# Patient Record
Sex: Male | Born: 1955
Health system: Southern US, Community
[De-identification: ages and names within clinical notes are randomized; demographics above are authoritative.]

## PROBLEM LIST (undated history)

## (undated) DIAGNOSIS — K649 Unspecified hemorrhoids: Secondary | ICD-10-CM

## (undated) DIAGNOSIS — F419 Anxiety disorder, unspecified: Secondary | ICD-10-CM

## (undated) DIAGNOSIS — G20 Parkinson's disease: Secondary | ICD-10-CM

## (undated) DIAGNOSIS — E785 Hyperlipidemia, unspecified: Secondary | ICD-10-CM

## (undated) DIAGNOSIS — T7840XA Allergy, unspecified, initial encounter: Secondary | ICD-10-CM

## (undated) DIAGNOSIS — K589 Irritable bowel syndrome without diarrhea: Secondary | ICD-10-CM

## (undated) DIAGNOSIS — G20A1 Parkinson's disease without dyskinesia, without mention of fluctuations: Secondary | ICD-10-CM

## (undated) DIAGNOSIS — R569 Unspecified convulsions: Secondary | ICD-10-CM

## (undated) DIAGNOSIS — F329 Major depressive disorder, single episode, unspecified: Secondary | ICD-10-CM

## (undated) HISTORY — DX: Anxiety disorder, unspecified: F41.9

## (undated) HISTORY — DX: Parkinson's disease: G20

## (undated) HISTORY — DX: Major depressive disorder, single episode, unspecified: F32.9

## (undated) HISTORY — PX: ROOT CANAL: SHX2363

## (undated) HISTORY — DX: Irritable bowel syndrome without diarrhea: K58.9

## (undated) HISTORY — DX: Unspecified convulsions: R56.9

## (undated) HISTORY — DX: Unspecified hemorrhoids: K64.9

## (undated) HISTORY — DX: Hyperlipidemia, unspecified: E78.5

## (undated) HISTORY — PX: APPENDECTOMY: SHX54

## (undated) HISTORY — DX: Allergy, unspecified, initial encounter: T78.40XA

## (undated) HISTORY — DX: Parkinson's disease without dyskinesia, without mention of fluctuations: G20.A1

---
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
## 2000-05-01 ENCOUNTER — Encounter: Payer: Self-pay | Admitting: Gastroenterology

## 2000-05-01 HISTORY — PX: COLONOSCOPY: SHX174

## 2004-05-17 ENCOUNTER — Ambulatory Visit: Payer: Self-pay | Admitting: Internal Medicine

## 2004-08-12 ENCOUNTER — Ambulatory Visit: Payer: Self-pay | Admitting: Internal Medicine

## 2005-03-21 ENCOUNTER — Ambulatory Visit: Payer: Self-pay | Admitting: Internal Medicine

## 2006-08-11 DIAGNOSIS — F329 Major depressive disorder, single episode, unspecified: Secondary | ICD-10-CM

## 2006-08-11 DIAGNOSIS — F32A Depression, unspecified: Secondary | ICD-10-CM

## 2006-08-11 DIAGNOSIS — F411 Generalized anxiety disorder: Secondary | ICD-10-CM | POA: Insufficient documentation

## 2006-08-11 HISTORY — DX: Depression, unspecified: F32.A

## 2007-08-22 ENCOUNTER — Telehealth: Payer: Self-pay | Admitting: Internal Medicine

## 2007-08-23 ENCOUNTER — Ambulatory Visit: Payer: Self-pay | Admitting: Internal Medicine

## 2007-08-23 DIAGNOSIS — R109 Unspecified abdominal pain: Secondary | ICD-10-CM | POA: Insufficient documentation

## 2007-08-23 DIAGNOSIS — N508 Other specified disorders of male genital organs: Secondary | ICD-10-CM | POA: Insufficient documentation

## 2007-08-23 LAB — CONVERTED CEMR LAB
Basophils Relative: 0.2 % (ref 0.0–3.0)
CO2: 31 meq/L (ref 19–32)
Calcium: 9.5 mg/dL (ref 8.4–10.5)
Creatinine, Ser: 0.9 mg/dL (ref 0.4–1.5)
Glucose, Bld: 91 mg/dL (ref 70–99)
HCT: 43.1 % (ref 39.0–52.0)
Hemoglobin: 14.7 g/dL (ref 13.0–17.0)
Lymphocytes Relative: 28.1 % (ref 12.0–46.0)
MCHC: 34.2 g/dL (ref 30.0–36.0)
Monocytes Absolute: 0.5 10*3/uL (ref 0.1–1.0)
Monocytes Relative: 7.7 % (ref 3.0–12.0)
Neutro Abs: 4.4 10*3/uL (ref 1.4–7.7)
RBC: 4.56 M/uL (ref 4.22–5.81)

## 2007-08-24 ENCOUNTER — Ambulatory Visit: Payer: Self-pay | Admitting: Internal Medicine

## 2007-08-24 ENCOUNTER — Encounter: Admission: RE | Admit: 2007-08-24 | Discharge: 2007-08-24 | Payer: Self-pay | Admitting: Internal Medicine

## 2007-08-29 ENCOUNTER — Ambulatory Visit: Payer: Self-pay | Admitting: Internal Medicine

## 2007-08-29 DIAGNOSIS — K589 Irritable bowel syndrome without diarrhea: Secondary | ICD-10-CM

## 2007-08-29 HISTORY — DX: Irritable bowel syndrome, unspecified: K58.9

## 2007-08-30 ENCOUNTER — Telehealth: Payer: Self-pay | Admitting: Internal Medicine

## 2007-09-03 ENCOUNTER — Ambulatory Visit: Payer: Self-pay | Admitting: Internal Medicine

## 2007-09-03 DIAGNOSIS — R1011 Right upper quadrant pain: Secondary | ICD-10-CM | POA: Insufficient documentation

## 2007-09-03 LAB — CONVERTED CEMR LAB
ALT: 15 units/L (ref 0–53)
AST: 17 units/L (ref 0–37)
Albumin: 3.9 g/dL (ref 3.5–5.2)
Amylase: 117 units/L (ref 27–131)
Total Protein: 6.5 g/dL (ref 6.0–8.3)

## 2007-09-11 ENCOUNTER — Encounter: Admission: RE | Admit: 2007-09-11 | Discharge: 2007-09-11 | Payer: Self-pay | Admitting: Internal Medicine

## 2007-09-11 ENCOUNTER — Ambulatory Visit: Payer: Self-pay | Admitting: Cardiovascular Disease

## 2007-09-14 ENCOUNTER — Telehealth: Payer: Self-pay | Admitting: Gastroenterology

## 2007-09-17 ENCOUNTER — Ambulatory Visit: Payer: Self-pay | Admitting: Gastroenterology

## 2007-09-21 ENCOUNTER — Telehealth: Payer: Self-pay | Admitting: Internal Medicine

## 2007-09-24 ENCOUNTER — Ambulatory Visit: Payer: Self-pay | Admitting: Internal Medicine

## 2007-09-24 ENCOUNTER — Ambulatory Visit: Payer: Self-pay | Admitting: Gastroenterology

## 2007-09-24 LAB — HM COLONOSCOPY

## 2007-09-28 LAB — CONVERTED CEMR LAB: VLDL: 24 mg/dL (ref 0–40)

## 2007-10-30 ENCOUNTER — Telehealth: Payer: Self-pay | Admitting: Internal Medicine

## 2007-11-02 ENCOUNTER — Ambulatory Visit: Payer: Self-pay | Admitting: Internal Medicine

## 2007-11-02 DIAGNOSIS — J069 Acute upper respiratory infection, unspecified: Secondary | ICD-10-CM | POA: Insufficient documentation

## 2008-08-21 ENCOUNTER — Telehealth (INDEPENDENT_AMBULATORY_CARE_PROVIDER_SITE_OTHER): Payer: Self-pay | Admitting: *Deleted

## 2009-07-11 ENCOUNTER — Emergency Department (HOSPITAL_COMMUNITY): Admission: EM | Admit: 2009-07-11 | Discharge: 2009-07-12 | Payer: Self-pay | Admitting: Emergency Medicine

## 2009-07-20 ENCOUNTER — Telehealth: Payer: Self-pay | Admitting: Internal Medicine

## 2009-07-27 ENCOUNTER — Ambulatory Visit (HOSPITAL_BASED_OUTPATIENT_CLINIC_OR_DEPARTMENT_OTHER): Admission: RE | Admit: 2009-07-27 | Discharge: 2009-07-27 | Payer: Self-pay | Admitting: Orthopedic Surgery

## 2009-08-09 IMAGING — CT CT ABDOMEN W/ CM
4 of 6 series · 16 of 46 positions shown, 18 images · IV contrast (Omnipaque 300)
Comparison: None.

CT ABDOMEN

CLINICAL DATA: Upper abdominal pain.  Diarrhea.  Right testicular
pain.  Surgical history includes hernia repair and appendectomy.

CT ABDOMEN AND PELVIS WITH CONTRAST  08/24/2007:
TECHNIQUE: Multidetector CT imaging of the abdomen and pelvis was
performed using the standard protocol following bolus
administration of intravenous contrast.
Contrast: 100 ml 2mnipaque-C11 IV.  Dilute Omnipaque as oral
contrast.

[Series 2: abd_pel 5.0 b30f st · axial · 0.78mm/px · z∈[-502,-58]mm · 10 of 109 slices shown, 12 images]
[im 10/109  soft-tissue]
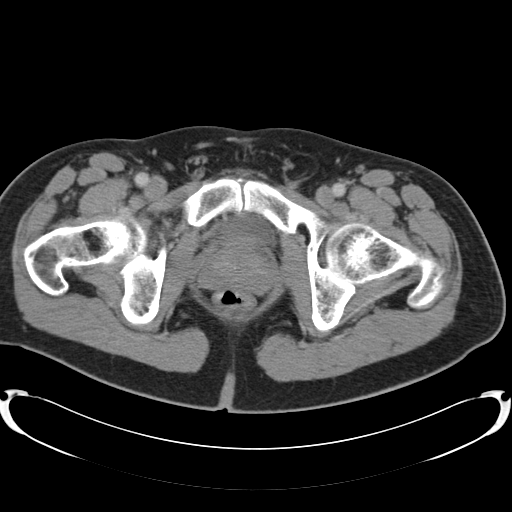
[im 10/109  bone]
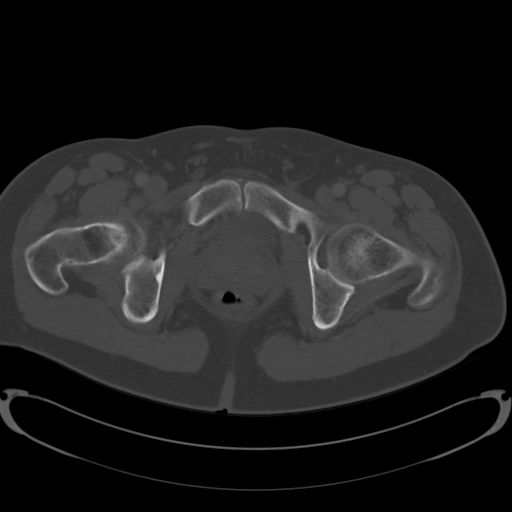
[im 20/109  soft-tissue]
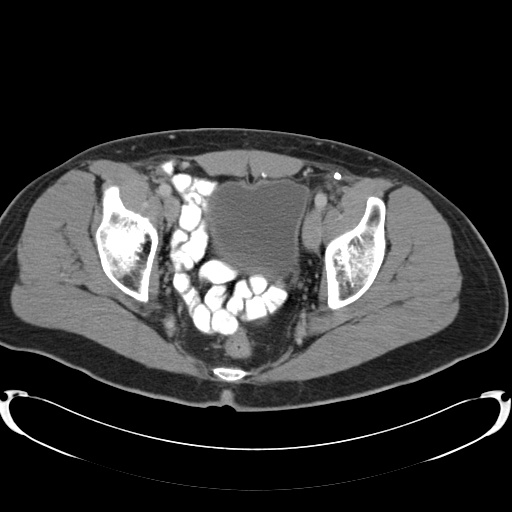
[im 30/109  soft-tissue]
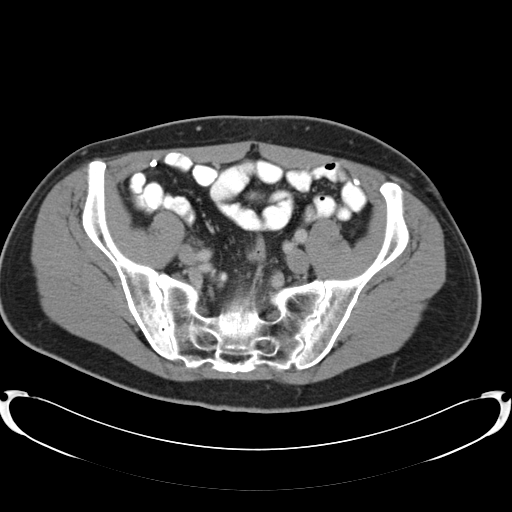
[im 40/109  soft-tissue]
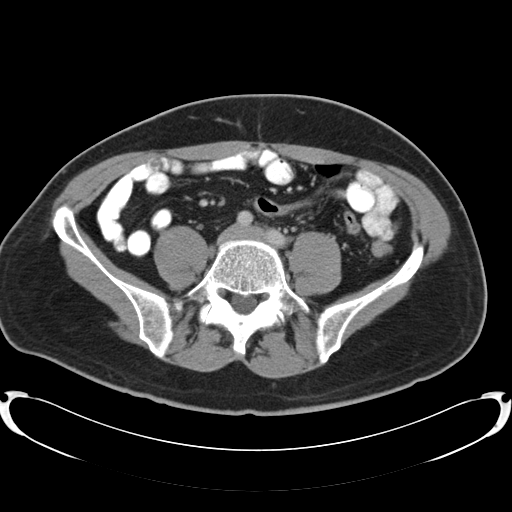
[im 50/109  soft-tissue]
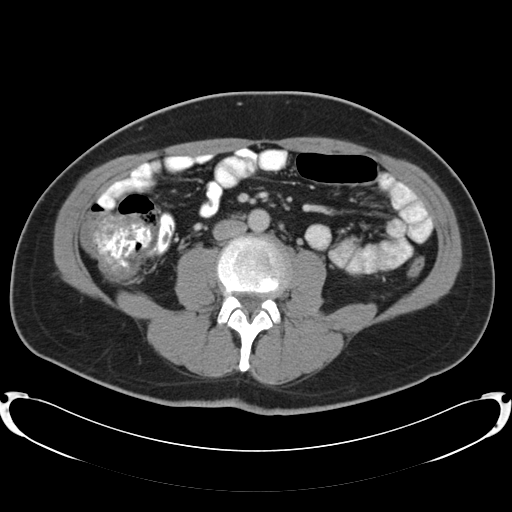
[im 59/109  soft-tissue]
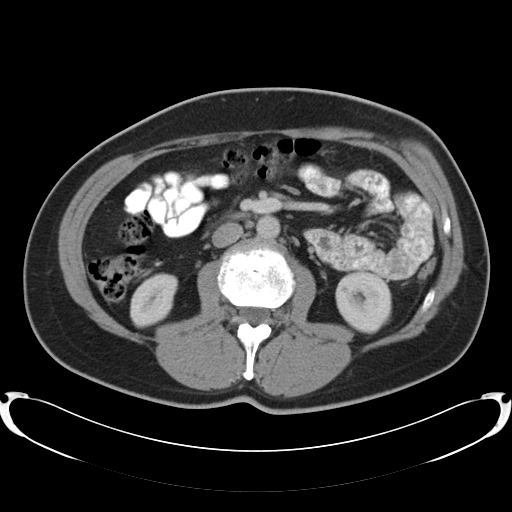
[im 69/109  soft-tissue]
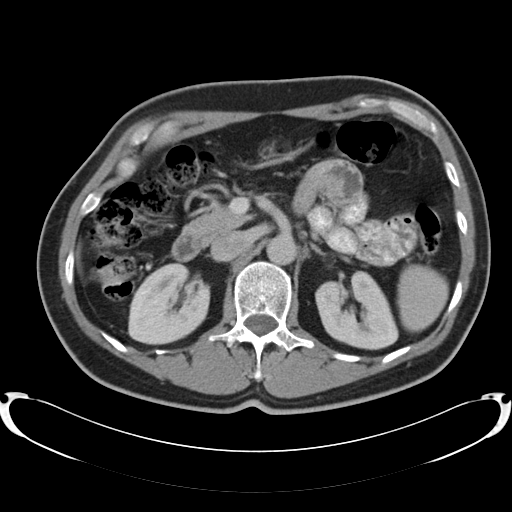
[im 79/109  soft-tissue]
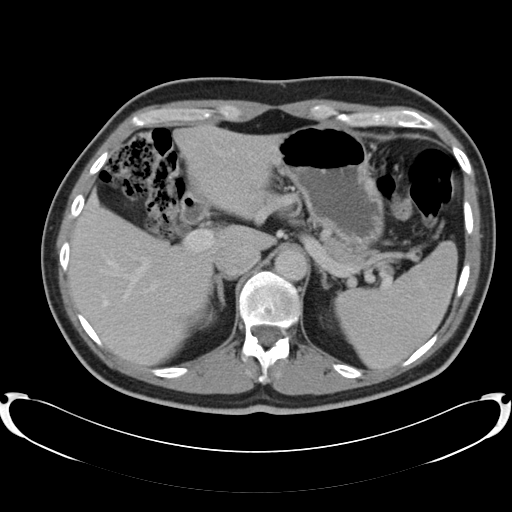
[im 89/109  soft-tissue]
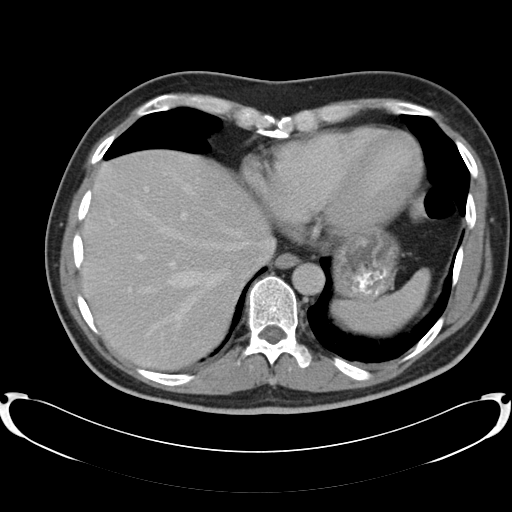
[im 89/109  bone]
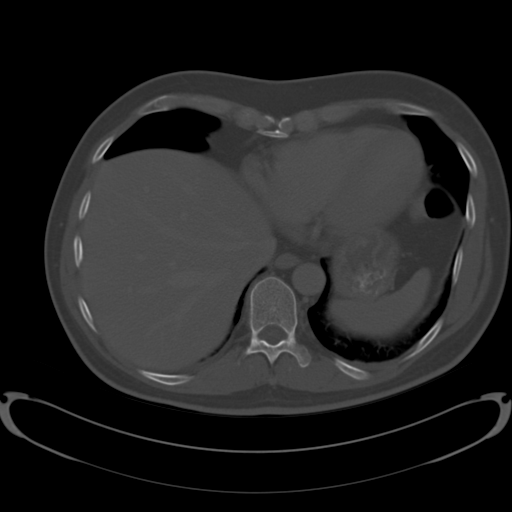
[im 99/109  soft-tissue]
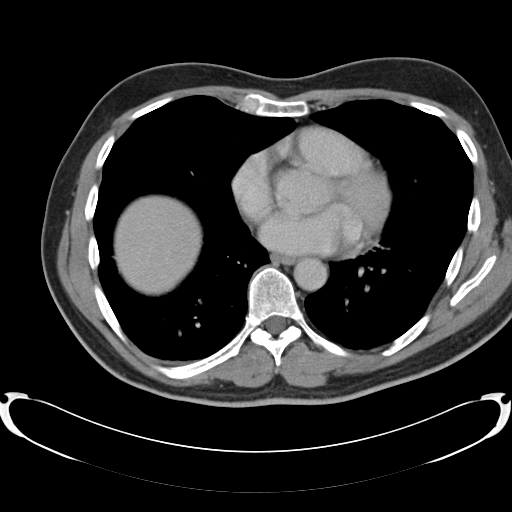

[Series 4: kidney_delay 5.0 b30f st · axial · 0.77mm/px · z∈[-617,-562]mm · 2 of 34 slices shown]
[im 12/34  soft-tissue]
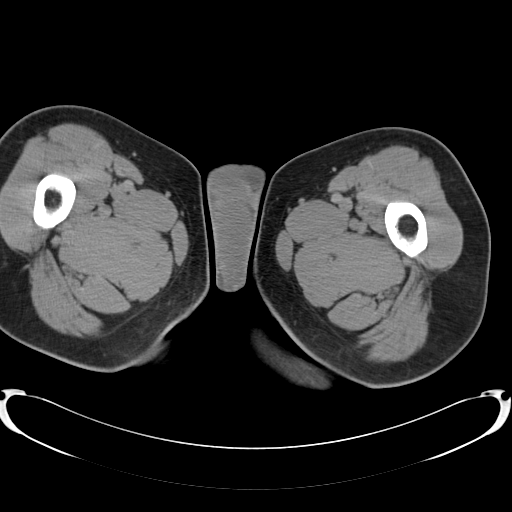
[im 23/34  soft-tissue]
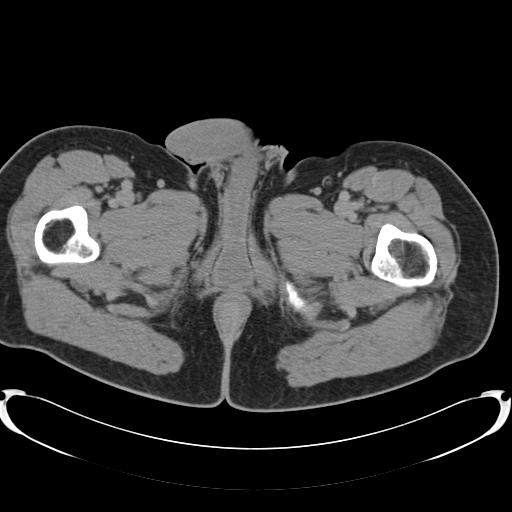

[Series 5: abd_pel 5.0 b60f lung · axial · 0.78mm/px · 1 of 38 slices shown]
[im 13/38  bone]
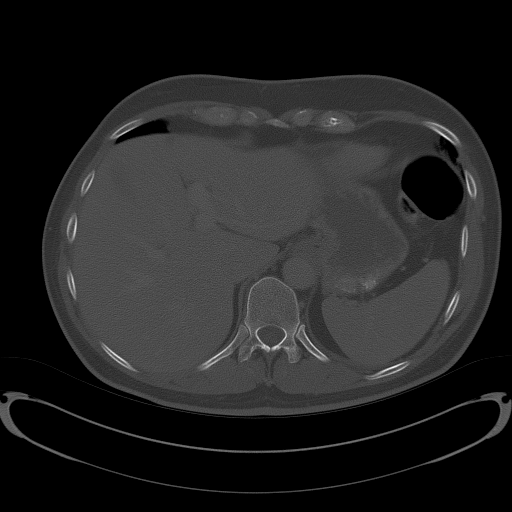

[Series 602: <mpr thick range> · coronal · 1.09mm/px · 3 of 85 slices shown]
[im 29/85  soft-tissue]
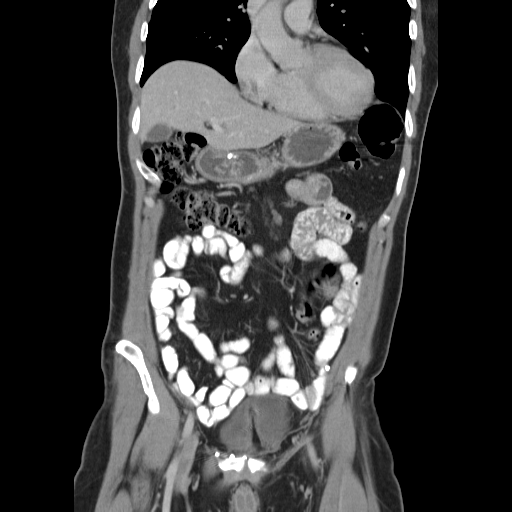
[im 38/85  soft-tissue]
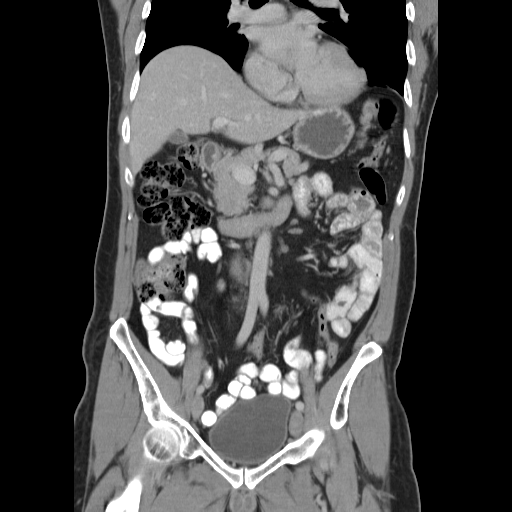
[im 47/85  soft-tissue]
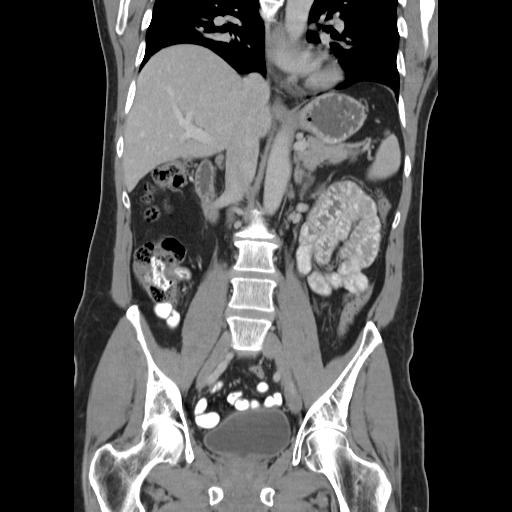

[16 of 46 positions shown; findings below may reference images not displayed]

FINDINGS: Normal appearing liver, spleen, pancreas, adrenal
glands, and kidneys.  Gallbladder unremarkable by CT.  No biliary
ductal dilation.  Stomach and visualized small bowel and colon
unremarkable in the upper abdomen; moderate stool noted throughout
the ascending and transverse colon, and the descending colon is
decompressed.  Early minimal abdominal aortic atherosclerosis.
Patent visceral arteries.  No significant lymphadenopathy.  No
ascites.  Visualized lung bases clear.  Note made of early mild
calcification within the LAD coronary artery. Mild degenerative
changes in the lower thoracic spine and degenerative disc disease
and spondylosis at the L2-3 level.
IMPRESSION: 1.  No acute abnormalities in the abdomen apart from possible
constipation.
2.  Early mild LAD coronary artery calcification.

CT PELVIS
FINDINGS: Sigmoid colon and rectum decompressed.  Visualized small
bowel unremarkable.  Urinary bladder unremarkable.  Prostate gland
upper normal in size to slightly enlarged.  Seminal vesicles
normal. Surgical mesh material overlying the lower pelvis without
evidence of recurrent abdominal wall hernia.  No inguinal hernias.
No significant lymphadenopathy.  Early mild right common iliac
artery atherosclerosis.  No ascites.  Small bilateral hydroceles
noted in the scrotum.  Bone window images unremarkable.
IMPRESSION: 1.  No acute abnormalities in the pelvis.
2.  Borderline prostate gland enlargement.
3.  Small bilateral hydroceles noted in the scrotum.

## 2009-08-17 ENCOUNTER — Encounter: Payer: Self-pay | Admitting: Internal Medicine

## 2009-08-27 IMAGING — US US ABDOMEN COMPLETE
1 series · 14 of 25 positions shown · non-contrast
Comparison: [REDACTED] abdominal pelvic CT 08/24/2007.

CLINICAL DATA: Right upper quadrant abdominal pain.  Appendectomy.

ABDOMEN ULTRASOUND
TECHNIQUE: Complete abdominal ultrasound examination was performed
including evaluation of the liver, gallbladder, bile ducts,
pancreas, kidneys, spleen, IVC, and abdominal aorta.

[Series 1: us abdomen complete · 0.26mm/px · 14 of 66 slices shown]
[im 1/66]
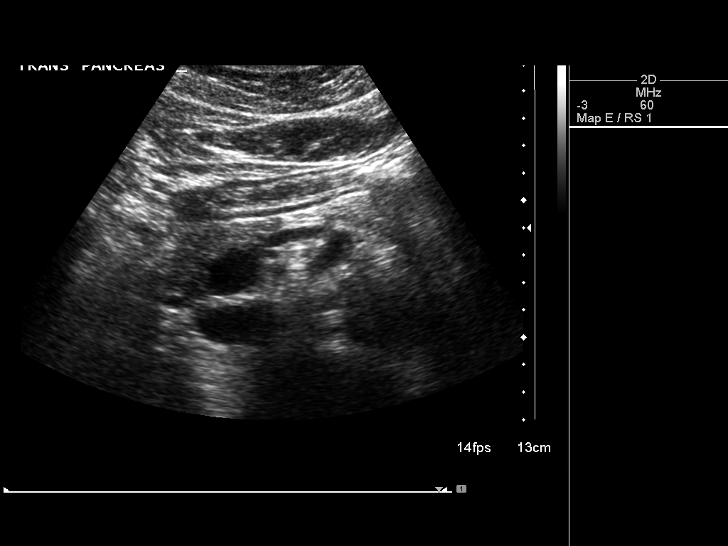
[im 6/66]
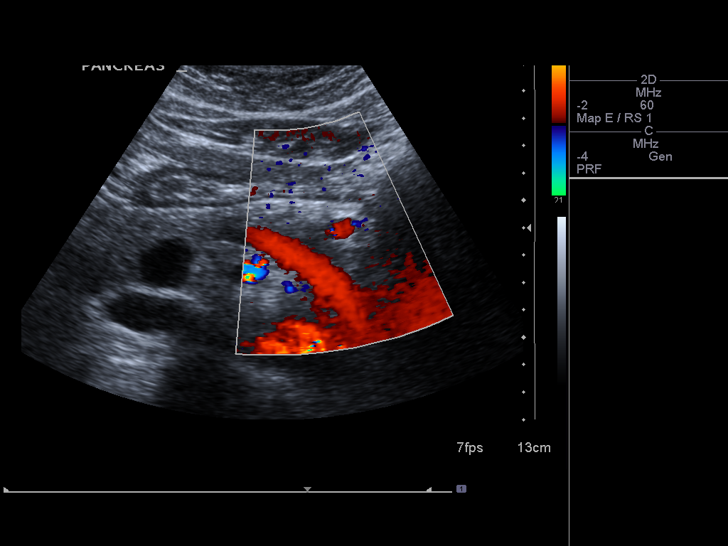
[im 11/66]
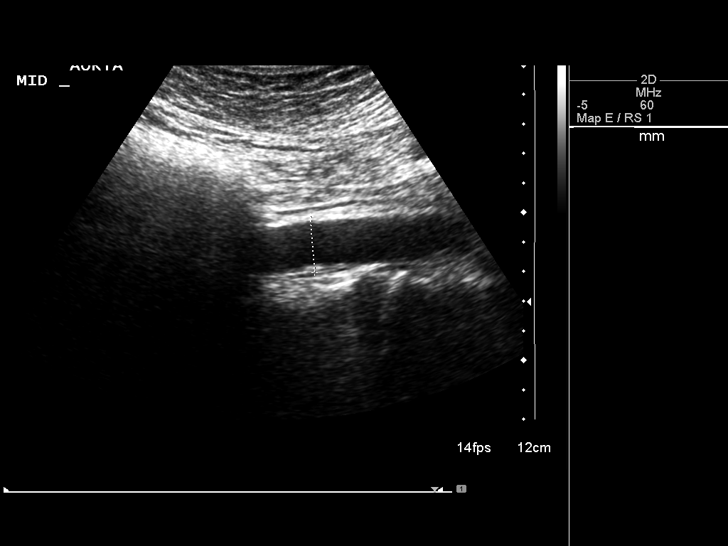
[im 17/66]
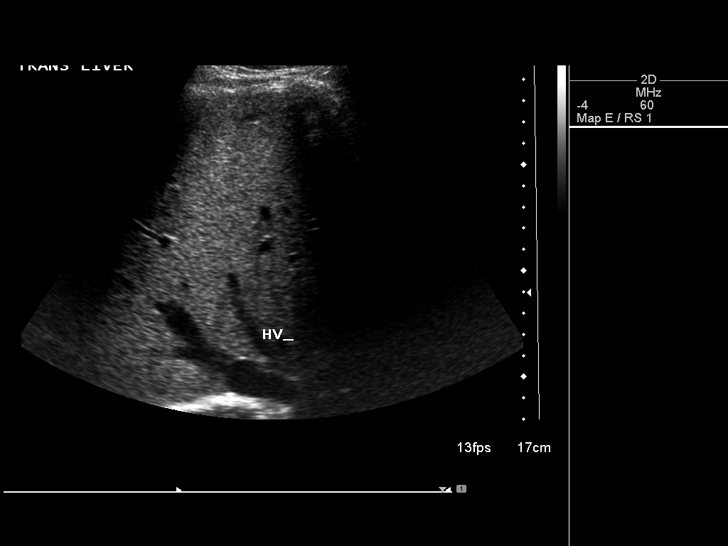
[im 22/66]
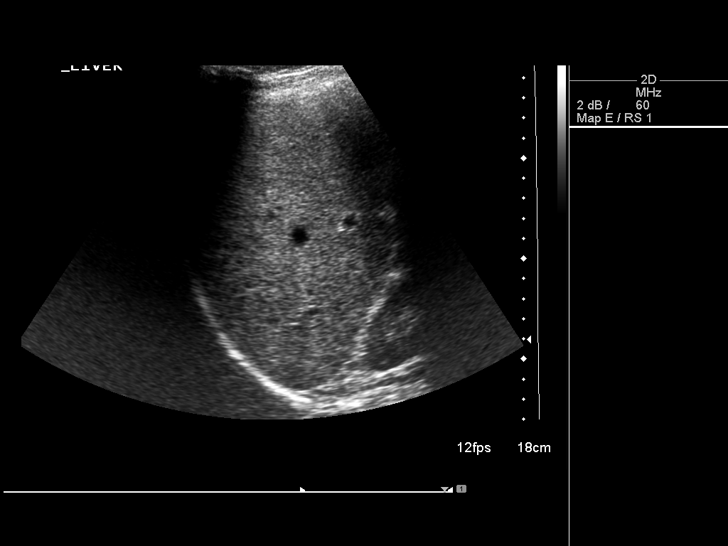
[im 25/66]
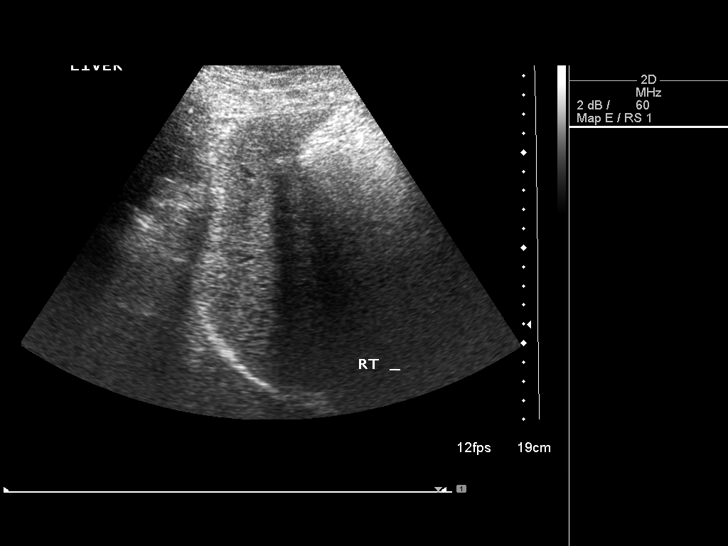
[im 30/66]
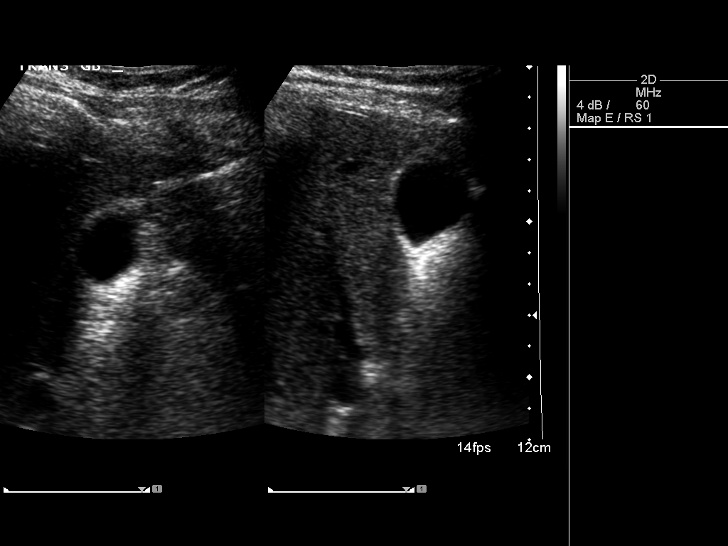
[im 36/66]
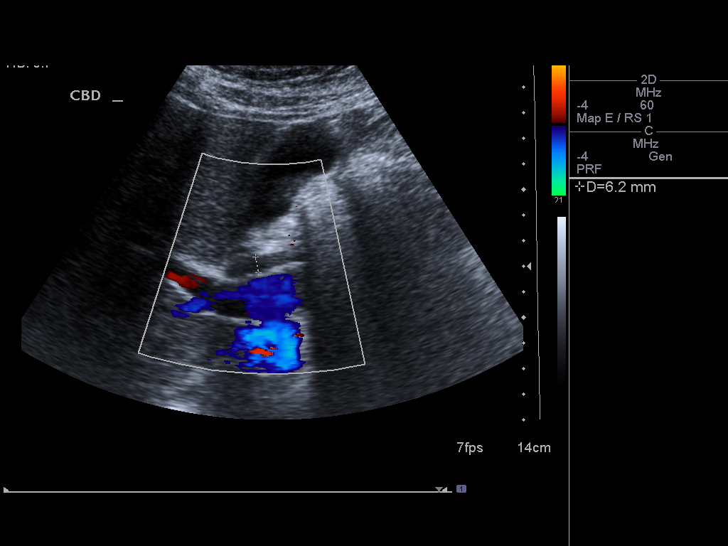
[im 41/66]
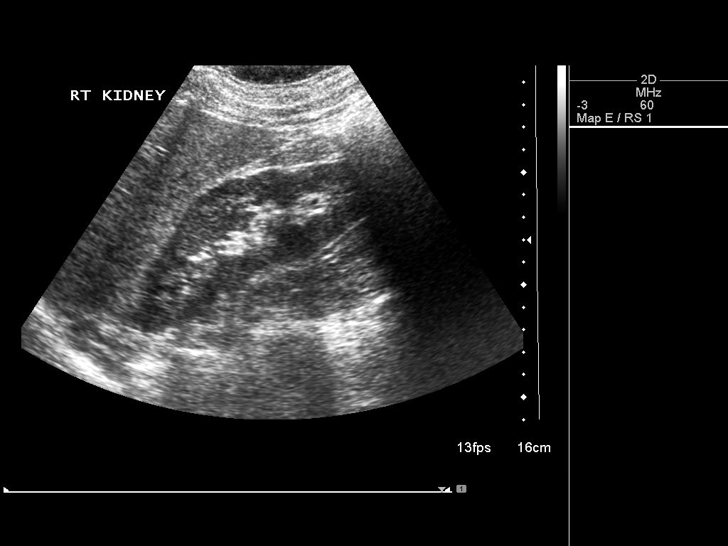
[im 44/66]
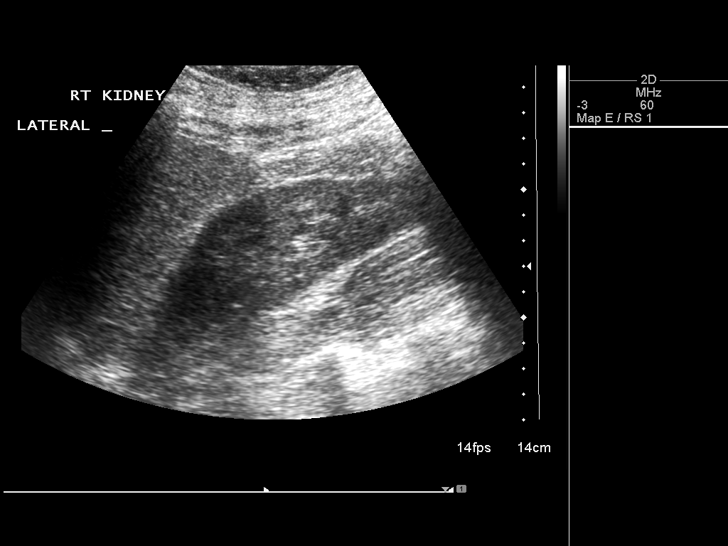
[im 49/66]
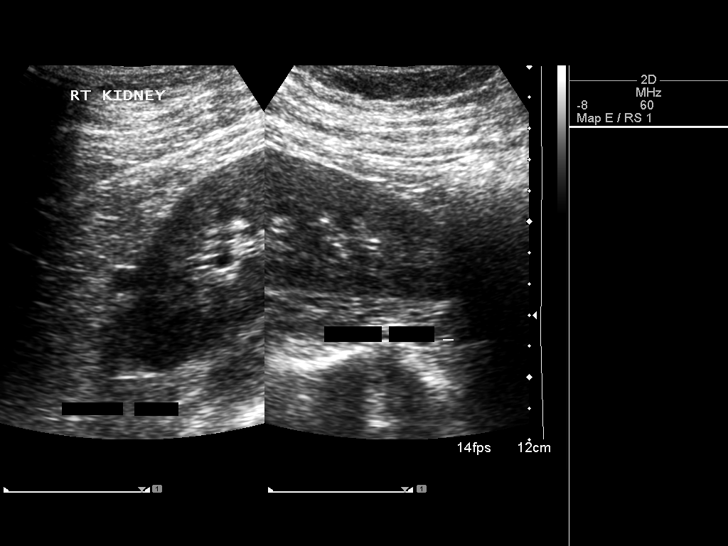
[im 55/66]
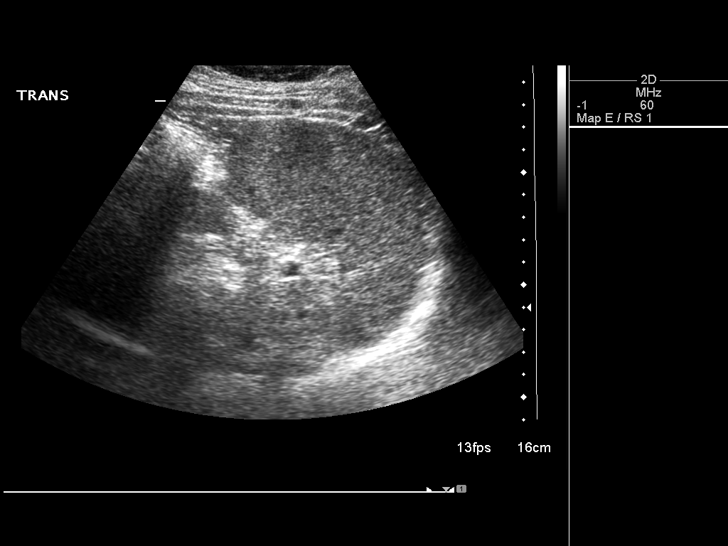
[im 60/66]
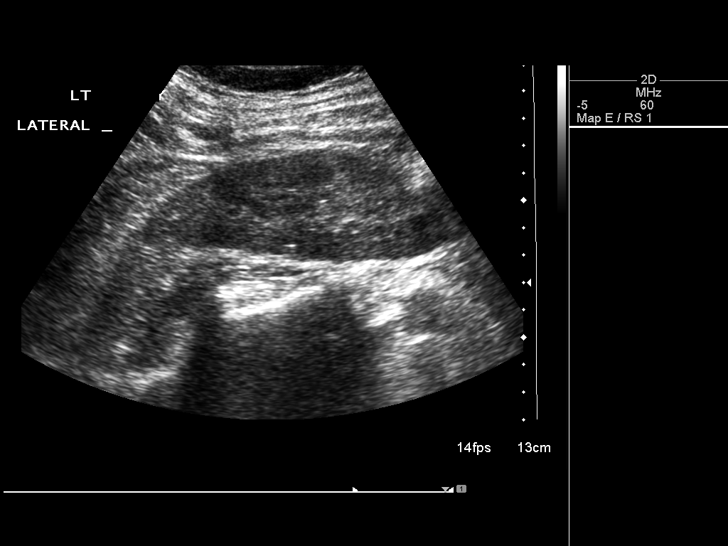
[im 66/66]
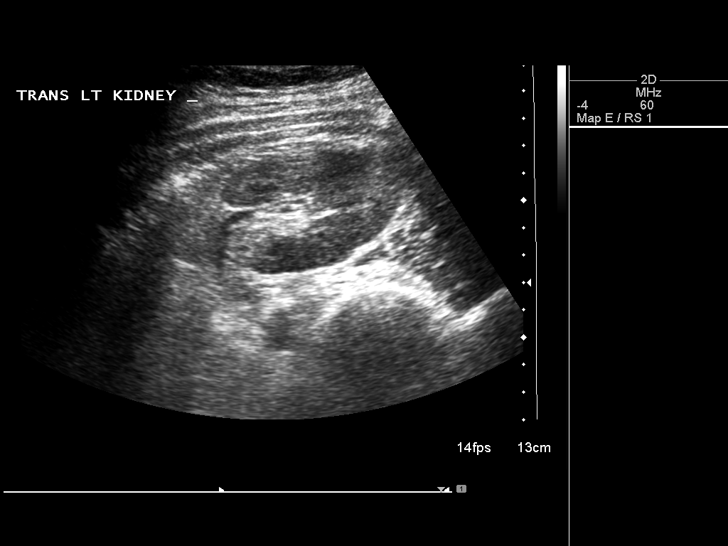

[14 of 25 positions shown; findings below may reference images not displayed]

FINDINGS: Gallbladder is sonographically normal without sludge or
gallstones with normal 2 mm wall thickness.  No dilated
intrahepatic or extrahepatic bile ducts seen with common bile duct
measuring normally at 6 mm.  Liver, inferior vena cava, pancreas,
spleen (12.1 cm long), left kidney (13.3 cm long), and abdominal
aorta (maximum diameter 2.8 cm) appear sonographically normal.  9
mm exophytic upper pole right renal cortical cyst is currently
better visualized probably due to different slice level and small
size of the lesion since prior abdominal CT.  Right kidney measures
13.1 cm long with no hydronephrosis or additional focal renal
lesion.
IMPRESSION: 1.  9 mm exophytic upper pole right renal cortical cyst.
2.  Otherwise, negative.

## 2009-10-29 ENCOUNTER — Ambulatory Visit: Payer: Self-pay | Admitting: Internal Medicine

## 2009-10-29 LAB — CONVERTED CEMR LAB
BUN: 15 mg/dL (ref 6–23)
Basophils Absolute: 0 10*3/uL (ref 0.0–0.1)
Bilirubin Urine: NEGATIVE
Blood in Urine, dipstick: NEGATIVE
Cholesterol: 180 mg/dL (ref 0–200)
Eosinophils Absolute: 0.1 10*3/uL (ref 0.0–0.7)
GFR calc non Af Amer: 102.57 mL/min (ref >60)
Glucose, Bld: 89 mg/dL (ref 70–99)
Glucose, Urine, Semiquant: NEGATIVE
HCT: 44.2 % (ref 39.0–52.0)
Ketones, urine, test strip: NEGATIVE
Lymphs Abs: 1.7 10*3/uL (ref 0.7–4.0)
MCV: 94.5 fL (ref 78.0–100.0)
Monocytes Absolute: 0.5 10*3/uL (ref 0.1–1.0)
Monocytes Relative: 7.6 % (ref 3.0–12.0)
PSA: 3.18 ng/mL (ref 0.10–4.00)
Platelets: 173 10*3/uL (ref 150.0–400.0)
Potassium: 3.8 meq/L (ref 3.5–5.1)
Protein, U semiquant: NEGATIVE
RDW: 13.8 % (ref 11.5–14.6)
TSH: 1.15 microintl units/mL (ref 0.35–5.50)
Total Bilirubin: 0.6 mg/dL (ref 0.3–1.2)
VLDL: 37.6 mg/dL (ref 0.0–40.0)
pH: 5.5

## 2009-11-30 ENCOUNTER — Ambulatory Visit: Payer: Self-pay | Admitting: Internal Medicine

## 2009-11-30 ENCOUNTER — Encounter: Payer: Self-pay | Admitting: Internal Medicine

## 2009-11-30 LAB — CONVERTED CEMR LAB
Cholesterol, target level: 200 mg/dL
HDL goal, serum: 40 mg/dL
LDL Goal: 100 mg/dL

## 2010-02-03 NOTE — Progress Notes (Signed)
Summary: Needs RX for Foot Orthotics  Phone Note Call from Patient Call back at Home Phone [phone]   Reason for Call: Acute Illness Summary of Call: Need rx for foot orthotics. Initial call taken by: Trixie Dredge,  July 20, 2009 4:06 PM  Follow-up for Phone Call        no ov in 2 years- needs ov with dr Lovell Sheehan or he can go to podiatrist--we h ave no record of anything being wrong with his feet Follow-up by: Willy Eddy, LPN,  July 20, 2009 4:08 PM  Additional Follow-up for Phone Call Additional follow up Details #1::        I called pt to notify him with info that is noted above, and pt said he would like to get a referral to a podiatrist. Pls call pt back with information.  Pt has sch a cpx with Dr. Lovell Sheehan on 11/04/09 and labs on 10/29/09.  Additional Follow-up by: Lucy Antigua,  July 20, 2009 4:20 PM    Additional Follow-up for Phone Call Additional follow up Details #2::    can make own appointment -may see dr Harriet Pho number is [phone] Follow-up by: Willy Eddy, LPN,  July 20, 2009 4:43 PM  Additional Follow-up for Phone Call Additional follow up Details #3:: Details for Additional Follow-up Action Taken: I called and notified pt with info noted above.   Additional Follow-up by: Lucy Antigua,  July 21, 2009 9:23 AM

## 2010-02-03 NOTE — Assessment & Plan Note (Signed)
Summary: cpx/cjr/pt rescd//ccm   Vital Signs:  Patient profile:   55 year old male Height:      75 inches Weight:      210 pounds BMI:     26.34 Temp:     98.2 degrees F oral Pulse rate:   65 / minute Resp:     14 per minute BP sitting:   140 / 90  (left arm)  Vitals Entered By: Willy Eddy, LPN (November 30, 2009 1:38 PM) CC: cpx, Lipid Management Is Patient Diabetic? No   Primary Care Provider:  Darryll Capers MD  CC:  cpx and Lipid Management.  History of Present Illness: The pt was asked about all immunizations, health maint. services that are appropriate to their age and was given guidance on diet exercize  and weight management   Lipid Management History:      Positive NCEP/ATP III risk factors include male age 55 years old or older, HDL cholesterol less than 40, and ASHD (either angina/prior MI/prior CABG).  Negative NCEP/ATP III risk factors include non-tobacco-user status.     Preventive Screening-Counseling & Management  Alcohol-Tobacco     Smoking Status: never  Current Problems (verified): 1)  Uri  (ICD-465.9) 2)  Rectal Bleeding  (ICD-569.3) 3)  Abdominal Pain, Right Upper Quadrant  (ICD-789.01) 4)  Coronary Atherosclerosis Native Coronary Artery  (ICD-414.01) 5)  Irritable Bowel Syndrome  (ICD-564.1) 6)  Testicular Mass  (ICD-608.89) 7)  Abdominal Pain, Acute  (ICD-789.00) 8)  Depression  (ICD-311) 9)  Anxiety  (ICD-300.00)  Current Medications (verified): 1)  None  Allergies (verified): No Known Drug Allergies  Past History:  Family History: Last updated: 09/17/2007 Family History Hypertension Family History Other cancer-Breast -Mother Maternal Grandmother Fam hx Heart disease- Father Prostate CA- Father  Social History: Last updated: 09/17/2007 Single Occupation: Publishing copy Alcohol Use - no Patient gets regular exercise.-walking daily  Risk Factors: Exercise: yes (09/17/2007)  Risk Factors: Smoking Status: never  (11/30/2009)  Past medical, surgical, family and social histories (including risk factors) reviewed, and no changes noted (except as noted below).  Past Medical History: Reviewed history from 08/11/2006 and no changes required. Anxiety Depression  Past Surgical History: Reviewed history from 09/17/2007 and no changes required. Appendectomy T&A Colonoscopy-05/01/2000 Double Hernia 2001 Tonsillectomy 1958  Family History: Reviewed history from 09/17/2007 and no changes required. Family History Hypertension Family History Other cancer-Breast -Mother Maternal Grandmother Fam hx Heart disease- Father Prostate CA- Father  Social History: Reviewed history from 09/17/2007 and no changes required. Single Occupation: Publishing copy Alcohol Use - no Patient gets regular exercise.-walking daily  Review of Systems  The patient denies anorexia, fever, weight loss, weight gain, vision loss, decreased hearing, hoarseness, chest pain, syncope, dyspnea on exertion, peripheral edema, prolonged cough, headaches, hemoptysis, abdominal pain, melena, hematochezia, severe indigestion/heartburn, hematuria, incontinence, genital sores, muscle weakness, suspicious skin lesions, transient blindness, difficulty walking, depression, unusual weight change, abnormal bleeding, enlarged lymph nodes, angioedema, and breast masses.    Physical Exam  General:  Well-developed,well-nourished,in no acute distress; alert,appropriate and cooperative throughout examination Head:  Normocephalic and atraumatic without obvious abnormalities. No apparent alopecia or balding. Eyes:  No corneal or conjunctival inflammation noted. EOMI. Perrla. Funduscopic exam benign, without hemorrhages, exudates or papilledema. Vision grossly normal. Ears:  External ear exam shows no significant lesions or deformities.  Otoscopic examination reveals clear canals, tympanic membranes are intact bilaterally without bulging, retraction,  inflammation or discharge. Hearing is grossly normal bilaterally. both ears appeared normal.  Weber did  lateralize to the left Nose:  External nasal examination shows no deformity or inflammation. Nasal mucosa are pink and moist without lesions or exudates. Mouth:  Oral mucosa and oropharynx without lesions or exudates.  Teeth in good repair. Neck:  No deformities, masses, or tenderness noted. Lungs:  Normal respiratory effort, chest expands symmetrically. Lungs are clear to auscultation, no crackles or wheezes. Heart:  Normal rate and regular rhythm. S1 and S2 normal without gallop, murmur, click, rub or other extra sounds. Abdomen:  Abdomen soft, nondistended, nontender. No masses. No hepatomegaly. Msk:  Symmetrical with no gross deformities. Normal posture. Pulses:  R and L carotid,radial,femoral,dorsalis pedis and posterior tibial pulses are full and equal bilaterally Extremities:  No edema. No palmar erythema. Neurologic:  Alert and  oriented x4;  grossly normal neurologically.   Impression & Recommendations:  Problem # 1:  PREVENTIVE HEALTH CARE (ICD-V70.0) .cpx Orders: EKG w/ Interpretation (93000)  Colonoscopy: Location:  [facility].   (09/24/2007) Td Booster: Historical (11/05/2007)   Flu Vax: Historical (01/03/2002)   Chol: 180 (10/29/2009)   HDL: 33.60 (10/29/2009)   LDL: 109 (10/29/2009)   TG: 188.0 (10/29/2009) TSH: 1.15 (10/29/2009)   PSA: 3.18 (10/29/2009) Next Colonoscopy due:: 05/2005 (05/01/2000)  Discussed using sunscreen, use of alcohol, drug use, self testicular exam, routine dental care, routine eye care, routine physical exam, seat belts, multiple vitamins, osteoporosis prevention, adequate calcium intake in diet, and recommendations for immunizations.  Discussed exercise and checking cholesterol.  Discussed gun safety, safe sex, and contraception. Also recommend checking PSA.  Complete Medication List: 1)  Niacin Flush Free 500 Mg Caps (Inositol  niacinate) .... One by mouth daily  with a light snack  Lipid Assessment/Plan:      Based on NCEP/ATP III, the patient's risk factor category is "history of coronary disease, peripheral vascular disease, cerebrovascular disease, or aortic aneurysm".  The patient's lipid goals are as follows: Total cholesterol goal is 200; LDL cholesterol goal is 100; HDL cholesterol goal is 40; Triglyceride goal is 150.    Patient Instructions: 1)  Please schedule a follow-up appointment in 6 months. 2)  Lipid Panel prior to visit, ICD-9:272.4   Orders Added: 1)  EKG w/ Interpretation [93000] 2)  Est. Patient 40-64 years [99396]   Immunization History:  Tetanus/Td Immunization History:    Tetanus/Td:  historical (11/05/2007)   Immunization History:  Tetanus/Td Immunization History:    Tetanus/Td:  Historical (11/05/2007)

## 2010-02-03 NOTE — Consult Note (Signed)
Summary: [facility]   Imported By: Maryln Gottron 08/20/2009 12:39:40  _____________________________________________________________________  External Attachment:    Type:   Image     Comment:   External Document

## 2010-05-17 ENCOUNTER — Other Ambulatory Visit (INDEPENDENT_AMBULATORY_CARE_PROVIDER_SITE_OTHER): Payer: BC Managed Care – PPO | Admitting: Internal Medicine

## 2010-05-17 DIAGNOSIS — E785 Hyperlipidemia, unspecified: Secondary | ICD-10-CM

## 2010-05-17 LAB — LIPID PANEL: Total CHOL/HDL Ratio: 5

## 2010-05-18 ENCOUNTER — Encounter: Payer: Self-pay | Admitting: Internal Medicine

## 2010-05-18 NOTE — Assessment & Plan Note (Signed)
[facility]                            CARDIOLOGY OFFICE NOTE   NAME:Bray, MARQUINN MESCHKE                      MRN:          [id number]  DATE:09/11/2007                            DOB:          1955/12/15    HISTORY OF PRESENT ILLNESS:  Dan [name] is a 55 year old patient  referred by Dr. Lovell Sheehan for pain in his chest, in the right rib area,  and coronary calcifications seen on CT.   Talking to Mr. Radziewicz, he has had somewhat atypical pain in the right  rib cage area for the last 4 weeks or so.  The pain started one morning  when he woke up.  He said it has been constant.  He does not note a  pleuritic component to it.  He feels that it is his gallbladder.  He has  done a lot of reading on the Internet.  He has had a CT scan, which did  not show any significant inflammation or abdominal processes.  He has  not been febrile or had an elevated white count.  He subsequently tells  me he had an ultrasound of the right upper quadrant without significant  findings.  He has not had a lot of anti-inflammatories for it.   The pain is very atypical in regards to having a coronary etiology.  Apparently, his CT scan showed a mild amount of calcium in the LAD.  I  explained to Adden that this is common in his age group, certainly we  could quantify with the calcium score, but I do not think it is  necessary.  He is active.  He exercises on a fairly regular basis and in  general until a few weeks ago, had no pains in his chest or right upper  quadrant.  His coronary risk factors primarily are negative with no  family history, no smoking, no diabetes.  Cholesterol status is unknown  to me and he is on hypertensive.   The patient has noted some relief with the clidinium/chlordiazepoxide  that he is taking b.i.d.  Apparently this is an irritable bowel  medicine.   There are no exacerbating factors.  He has not been eating any fatty  food.  There is no associated  nausea, vomiting, or change in bowel  movements.   REVIEW OF SYSTEMS:  Otherwise negative.   PAST MEDICAL HISTORY:  Otherwise remarkable for previous appendectomy,  anxiety, depression.  He has had a colonoscopy in 2002.  Tonsils and  adenoids out.   ALLERGIES:  He has no known allergies.   MEDICATIONS:  His only medication is the clidinium and chlordiazepoxide  2.5-5 mg capsules b.i.d.   FAMILY HISTORY:  Noncontributory.  The patient is originally from [place].  He is single, has never been married.  He is a Chartered certified accountant.  He does not smoke or drink.  He tries to be  physically active.  He does have 2 brothers and a sister in [place].  His mom and dad are down here.   PHYSICAL EXAMINATION:  GENERAL:  Remarkable for healthy-appearing white  male who markedly resembles Dr. Russella Dar and Dr. Lovell Sheehan.  VITAL SIGNS:  Blood pressure is 120/80, pulse 70 and regular,  respiratory rate 14, afebrile, weight 205.  HEENT:  Unremarkable.  Carotids are normal without bruit, no  lymphadenopathy, no thyromegaly, no JVP elevation.  LUNGS:  Clear.  Good diaphragmatic motion.  No wheezing.  S1 and S2.  Normal heart sounds.  PMI normal.  ABDOMEN:  Benign with previous surgical scars.  No right upper quadrant  pain.  No AAA, no tenderness, no bruit, no hepatosplenomegaly, no  hepatojugular reflux, no tenderness.  EXTREMITIES:  Distal pulses are intact.  No edema.  NEURO:  Nonfocal.  SKIN:  Warm and dry.  MUSCULOSKELETAL:  No muscular weakness.   His baseline EKG shows sinus rhythm with Q-wave in lead III, first  degree heart block with a PR interval of 214 and poor R-wave  progression.   IMPRESSION:  1. Atypical right upper quadrant pain with coronary calcium in the      left anterior descending (coronary artery).  Mildly abnormal EKG.      Followup stress echocardiogram.  As long as this is normal, I do      not think he needs further cardiology followup.  2. Right upper  quadrant pain.  This may very well be strain to the      ribs, a course of tramadol or other noninflammatory may be an      order.  However, the patient seems to have some benefit from his      irritable bowel treatment.  There is no obvious gallbladder      pathology, but I wonder if a HIDA scan may be an order in the      future.  He will continue to have his right upper quadrant pain      worked up by Dr. Lovell Sheehan so long as his stress echocardiogram is      normal.  He will be seen on an as-needed basis.     Noralyn Pick. Eden Emms, MD, [facility]  Electronically Signed    PCN/MedQ  DD: 09/11/2007  DT: 09/12/2007  Job #: [id number]

## 2010-05-24 ENCOUNTER — Ambulatory Visit (INDEPENDENT_AMBULATORY_CARE_PROVIDER_SITE_OTHER): Payer: BC Managed Care – PPO | Admitting: Internal Medicine

## 2010-05-24 ENCOUNTER — Encounter: Payer: Self-pay | Admitting: Internal Medicine

## 2010-05-24 VITALS — BP 130/74 | HR 76 | Temp 98.2°F | Resp 14 | Ht 75.0 in | Wt 206.0 lb

## 2010-05-24 DIAGNOSIS — E785 Hyperlipidemia, unspecified: Secondary | ICD-10-CM

## 2010-05-24 DIAGNOSIS — K649 Unspecified hemorrhoids: Secondary | ICD-10-CM

## 2010-05-24 DIAGNOSIS — Z0000 Encounter for general adult medical examination without abnormal findings: Secondary | ICD-10-CM

## 2010-05-24 DIAGNOSIS — F411 Generalized anxiety disorder: Secondary | ICD-10-CM

## 2010-05-24 HISTORY — DX: Hyperlipidemia, unspecified: E78.5

## 2010-05-24 HISTORY — DX: Unspecified hemorrhoids: K64.9

## 2010-05-24 MED ORDER — ASPIRIN EC 81 MG PO TBEC: 81.0000 mg | DELAYED_RELEASE_TABLET | Freq: Every day | ORAL | Status: DC

## 2010-05-24 NOTE — Progress Notes (Signed)
  Subjective:    Patient ID: Dan [name], male    DOB: 02-19-1955, 56 y.o.   MRN: [id number]  HPI Patient is a 55 year old white male presents for followup of hyperlipidemia and prior to the office visit.  His goal is LDL less than 130 he is at goal and doing well he is following a diet and exercising on a regular basis.  His anxiety and depression and been well controlled he is now in a stable relationship which has affected his mood.  He has an acute complaint of a bleeding hemorrhoid and history of hemorrhoids in the past. He has seen GI in the past for these   Review of Systems  Constitutional: Negative for fever and fatigue.  HENT: Negative for hearing loss, congestion, neck pain and postnasal drip.   Eyes: Negative for discharge, redness and visual disturbance.  Respiratory: Negative for cough, shortness of breath and wheezing.   Cardiovascular: Negative for leg swelling.  Gastrointestinal: Negative for abdominal pain, constipation and abdominal distention.  Genitourinary: Negative for urgency and frequency.  Musculoskeletal: Negative for joint swelling and arthralgias.  Skin: Negative for color change and rash.  Neurological: Negative for weakness and light-headedness.  Hematological: Negative for adenopathy.  Psychiatric/Behavioral: Negative for behavioral problems.   Past Medical History  Diagnosis Date  . Anxiety   . Depression    Past Surgical History  Procedure Date  . Appendectomy   . T&a   . Colonoscopy 05-01-00  . Hernia repair   . Tonsillectomy 1958    reports that he has never smoked. He does not have any smokeless tobacco history on file. He reports that he does not drink alcohol or use illicit drugs. family history includes Breast cancer in an unspecified family member; Heart disease in an unspecified family member; Hypertension in an unspecified family member; and Prostate cancer in his father. Allergies no known allergies     Objective:   Physical  Exam  Constitutional: He is oriented to person, place, and time. He appears well-developed and well-nourished.  HENT:  Head: Normocephalic and atraumatic.  Eyes: Conjunctivae are normal. Pupils are equal, round, and reactive to light.  Neck: Normal range of motion. Neck supple.  Cardiovascular: Normal rate and regular rhythm.   Pulmonary/Chest: Effort normal and breath sounds normal.  Abdominal: Soft. Bowel sounds are normal.  Musculoskeletal: Normal range of motion.  Neurological: He is alert and oriented to person, place, and time.  Skin: Skin is warm and dry.          Assessment & Plan:  Cholesterol management includes diet exercise and the use of omega-3 fatty acids.  He is encouraged to continue using an enteric-coated aspirin daily all of these are interventions that might be this factor reduction.  He is encouraged to continue his exercise program as it is has a positive effect.  His anxiety and depression stabilized with a long-term relationship

## 2010-09-15 ENCOUNTER — Telehealth: Payer: Self-pay | Admitting: *Deleted

## 2010-09-15 MED ORDER — SILVER SULFADIAZINE 1 % EX CREA: TOPICAL_CREAM | CUTANEOUS | Status: DC

## 2010-09-15 NOTE — Telephone Encounter (Signed)
Pt has 1st to 2 nd degree burns yesterday at work with glue. No fever, slept well, pain is not bad.  He would like to know what kind of cream he can use to wrap it with?

## 2010-09-15 NOTE — Telephone Encounter (Signed)
Deb can you call?

## 2010-09-15 NOTE — Telephone Encounter (Signed)
Call in silvadene cream to apply TID for burns

## 2010-09-15 NOTE — Telephone Encounter (Signed)
Notified pt. 

## 2010-11-19 ENCOUNTER — Other Ambulatory Visit (INDEPENDENT_AMBULATORY_CARE_PROVIDER_SITE_OTHER): Payer: BC Managed Care – PPO

## 2010-11-19 DIAGNOSIS — I10 Essential (primary) hypertension: Secondary | ICD-10-CM

## 2010-11-19 DIAGNOSIS — D649 Anemia, unspecified: Secondary | ICD-10-CM

## 2010-11-19 DIAGNOSIS — E785 Hyperlipidemia, unspecified: Secondary | ICD-10-CM

## 2010-11-19 DIAGNOSIS — N402 Nodular prostate without lower urinary tract symptoms: Secondary | ICD-10-CM

## 2010-11-19 DIAGNOSIS — E039 Hypothyroidism, unspecified: Secondary | ICD-10-CM

## 2010-11-19 LAB — HEPATIC FUNCTION PANEL
ALT: 15 U/L (ref 0–53)
AST: 16 U/L (ref 0–37)
Bilirubin, Direct: 0.1 mg/dL (ref 0.0–0.3)
Total Protein: 6.6 g/dL (ref 6.0–8.3)

## 2010-11-19 LAB — TSH: TSH: 1.15 u[IU]/mL (ref 0.35–5.50)

## 2010-11-19 LAB — CBC WITH DIFFERENTIAL/PLATELET
Basophils Relative: 0.6 % (ref 0.0–3.0)
Eosinophils Relative: 1.2 % (ref 0.0–5.0)
HCT: 44.2 % (ref 39.0–52.0)
MCV: 94.9 fl (ref 78.0–100.0)
Monocytes Absolute: 0.5 10*3/uL (ref 0.1–1.0)
Monocytes Relative: 9.8 % (ref 3.0–12.0)
Neutrophils Relative %: 56.4 % (ref 43.0–77.0)
RBC: 4.65 Mil/uL (ref 4.22–5.81)
WBC: 5.4 10*3/uL (ref 4.5–10.5)

## 2010-11-19 LAB — BASIC METABOLIC PANEL
Chloride: 105 mEq/L (ref 96–112)
GFR: 109.76 mL/min (ref >60.00)
Potassium: 4.5 mEq/L (ref 3.5–5.1)

## 2010-11-19 LAB — LIPID PANEL
LDL Cholesterol: 122 mg/dL — ABNORMAL HIGH (ref 0–99)
Total CHOL/HDL Ratio: 5
Triglycerides: 132 mg/dL (ref 0.0–149.0)

## 2010-11-19 LAB — PSA: PSA: 3.62 ng/mL (ref 0.10–4.00)

## 2010-12-03 ENCOUNTER — Encounter: Payer: Self-pay | Admitting: Internal Medicine

## 2010-12-03 ENCOUNTER — Ambulatory Visit (INDEPENDENT_AMBULATORY_CARE_PROVIDER_SITE_OTHER): Payer: BC Managed Care – PPO | Admitting: Internal Medicine

## 2010-12-03 VITALS — BP 132/90 | HR 68 | Temp 98.2°F | Resp 14 | Ht 75.0 in | Wt 201.0 lb

## 2010-12-03 DIAGNOSIS — Z0000 Encounter for general adult medical examination without abnormal findings: Secondary | ICD-10-CM

## 2010-12-03 NOTE — Progress Notes (Signed)
  Subjective:    Patient ID: Dan [name], male    DOB: 1955/01/19, 55 y.o.   MRN: [id number]  HPI  CPX   Review of Systems  Constitutional: Negative for fever and fatigue.  HENT: Negative for hearing loss, congestion, neck pain and postnasal drip.   Eyes: Negative for discharge, redness and visual disturbance.  Respiratory: Negative for cough, shortness of breath and wheezing.   Cardiovascular: Negative for leg swelling.  Gastrointestinal: Negative for abdominal pain, constipation and abdominal distention.  Genitourinary: Negative for urgency and frequency.  Musculoskeletal: Negative for joint swelling and arthralgias.  Skin: Negative for color change and rash.  Neurological: Negative for weakness and light-headedness.  Hematological: Negative for adenopathy.  Psychiatric/Behavioral: Negative for behavioral problems.       Past Medical History  Diagnosis Date  . Anxiety   . Depression     History   Social History  . Marital Status: Single    Spouse Name: N/A    Number of Children: N/A  . Years of Education: N/A   Occupational History  . Publishing copy    Social History Main Topics  . Smoking status: Never Smoker   . Smokeless tobacco: Not on file  . Alcohol Use: No  . Drug Use: No  . Sexually Active: Not on file   Other Topics Concern  . Not on file   Social History Narrative   Exercises regularly    Past Surgical History  Procedure Date  . Appendectomy   . T&a   . Colonoscopy 05-01-00  . Hernia repair   . Tonsillectomy 1958    Family History  Problem Relation Age of Onset  . Hypertension    . Breast cancer    . Heart disease    . Prostate cancer Father     No Known Allergies  No current outpatient prescriptions on file prior to visit.    BP 132/90  Pulse 68  Temp 98.2 F (36.8 C)  Resp 14  Ht 6\' 3"  (1.905 m)  Wt 201 lb (91.173 kg)  BMI 25.12 kg/m2    Objective:   Physical Exam  Nursing note and vitals  reviewed. Constitutional: He is oriented to person, place, and time. He appears well-developed and well-nourished.  HENT:  Head: Normocephalic and atraumatic.  Eyes: Conjunctivae are normal. Pupils are equal, round, and reactive to light.  Neck: Normal range of motion. Neck supple.  Cardiovascular: Normal rate and regular rhythm.   Pulmonary/Chest: Effort normal and breath sounds normal.  Abdominal: Soft. Bowel sounds are normal.  Genitourinary: Rectum normal and prostate normal.  Musculoskeletal: Normal range of motion.  Neurological: He is alert and oriented to person, place, and time.  Skin: Skin is warm and dry.  Psychiatric: He has a normal mood and affect.          Assessment & Plan:   Patient presents for yearly preventative medicine examination.   all immunizations and health maintenance protocols were reviewed with the patient and they are up to date with these protocols.   screening laboratory values were reviewed with the patient including screening of hyperlipidemia PSA renal function and hepatic function.   There medications past medical history social history problem list and allergies were reviewed in detail.   Goals were established with regard to weight loss exercise diet in compliance with medications

## 2010-12-03 NOTE — Patient Instructions (Signed)
The patient is instructed to continue all medications as prescribed. Schedule followup with check out clerk upon leaving the clinic  

## 2012-01-04 HISTORY — PX: HERNIA REPAIR: SHX51

## 2014-05-21 ENCOUNTER — Ambulatory Visit: Payer: BC Managed Care – PPO | Admitting: Family Medicine

## 2014-06-16 ENCOUNTER — Telehealth: Payer: Self-pay | Admitting: *Deleted

## 2014-06-16 NOTE — Telephone Encounter (Signed)
Patient walked in with complaints of red spots on his right upper and lower extremity, and left scapula. Negative for spots on trunk. Patient is asymptomatic; denies fever, chills, or pain. No history of immune comprised illnesses. HR 79 and O2 sats of 95 during visit. Patient states was at beach last week when noticed spots. Those vacationing with him did not have spots on them. Being home, patient stated he saw the spots continue to spread. MD Hunter at bedside and advised patient to purchase OTC hydrocortisone cream and if not better in two weeks to schedule appointment. Patient verbalized understanding.

## 2014-09-04 ENCOUNTER — Ambulatory Visit (INDEPENDENT_AMBULATORY_CARE_PROVIDER_SITE_OTHER): Payer: BLUE CROSS/BLUE SHIELD | Admitting: Family Medicine

## 2014-09-04 ENCOUNTER — Encounter: Payer: Self-pay | Admitting: Family Medicine

## 2014-09-04 VITALS — BP 124/80 | HR 75 | Temp 99.0°F | Wt 204.0 lb

## 2014-09-04 DIAGNOSIS — R251 Tremor, unspecified: Secondary | ICD-10-CM

## 2014-09-04 DIAGNOSIS — M21372 Foot drop, left foot: Secondary | ICD-10-CM | POA: Diagnosis not present

## 2014-09-04 DIAGNOSIS — M62422 Contracture of muscle, left upper arm: Secondary | ICD-10-CM | POA: Diagnosis not present

## 2014-09-04 DIAGNOSIS — E785 Hyperlipidemia, unspecified: Secondary | ICD-10-CM | POA: Diagnosis not present

## 2014-09-04 DIAGNOSIS — Z8042 Family history of malignant neoplasm of prostate: Secondary | ICD-10-CM

## 2014-09-04 LAB — LIPID PANEL
CHOL/HDL RATIO: 4
Cholesterol: 172 mg/dL (ref 0–200)
HDL: 38.5 mg/dL — AB (ref >39.00)
LDL CALC: 113 mg/dL — AB (ref 0–99)
NONHDL: 133.86
Triglycerides: 106 mg/dL (ref 0.0–149.0)
VLDL: 21.2 mg/dL (ref 0.0–40.0)

## 2014-09-04 LAB — CBC
HEMATOCRIT: 44.5 % (ref 39.0–52.0)
Hemoglobin: 15.1 g/dL (ref 13.0–17.0)
MCHC: 33.9 g/dL (ref 30.0–36.0)
MCV: 91.9 fl (ref 78.0–100.0)
Platelets: 201 10*3/uL (ref 150.0–400.0)
RBC: 4.84 Mil/uL (ref 4.22–5.81)
RDW: 13.4 % (ref 11.5–15.5)
WBC: 6.4 10*3/uL (ref 4.0–10.5)

## 2014-09-04 LAB — COMPREHENSIVE METABOLIC PANEL
ALT: 13 U/L (ref 0–53)
AST: 16 U/L (ref 0–37)
Albumin: 4.4 g/dL (ref 3.5–5.2)
Alkaline Phosphatase: 45 U/L (ref 39–117)
BUN: 19 mg/dL (ref 6–23)
CHLORIDE: 104 meq/L (ref 96–112)
CO2: 28 meq/L (ref 19–32)
Calcium: 9.5 mg/dL (ref 8.4–10.5)
Creatinine, Ser: 0.83 mg/dL (ref 0.40–1.50)
GFR: 100.8 mL/min (ref >60.00)
GLUCOSE: 89 mg/dL (ref 70–99)
POTASSIUM: 4.1 meq/L (ref 3.5–5.1)
SODIUM: 139 meq/L (ref 135–145)
TOTAL PROTEIN: 6.9 g/dL (ref 6.0–8.3)
Total Bilirubin: 1.1 mg/dL (ref 0.2–1.2)

## 2014-09-04 LAB — TSH: TSH: 0.8 u[IU]/mL (ref 0.35–4.50)

## 2014-09-04 LAB — PSA: PSA: 5.01 ng/mL — ABNORMAL HIGH (ref 0.10–4.00)

## 2014-09-04 NOTE — Patient Instructions (Addendum)
Biggest thing to figure out today is why you are having this left sided tremor in arm and leg, contracture left arm, foot drop on left. We are going to get an MRI to rule out prior stroke. This will also rule out tumor/lesions though with no progression in symptoms, I doubt this.   If MRI negative, will likely refer to neurology.   Fasting labs before you leave  Lets have you schedule next available physical as well to review your health maintenance and would need rectal. We will do your PSA along with labs today.

## 2014-09-04 NOTE — Assessment & Plan Note (Signed)
S: Starting 1 year ago, Left leg tends to drag when walking, has to make concerted effort with exercies. Left arm mild shaking with intention. Has tremor in left leg at times as well. Left hand tends to contract up about 30 degrees if he is standing while right arm remains by his side. Has to straighten arm back up. All issues left sided and started about a year ago. Only thing he remembers around that time as possible trigger- Did get sick after a root canal and strong antibiotics, had complete body weakness and was in bed for prolonged period while on medicine. Felt aware of left sided issue after that. No trauma or injury.  Despite these issues, leads an active lifestyle.   Started yoga. Has felt well. Doing some weight lifting. Daily exercise and very active job.  A/P: My primary concern is to rule out CVA or mass given all left sided issues (left foot dragging/drop, left sided tremor, contracture left arm) and will obtain MRI brain. If this is negative would get neurological consultation. Patient has minimal risk factors for CVA mainly being age and hyperlipidemia. We will reassess lipids today as well as get fasting CBG and TSH. MS would be a possibility as well.

## 2014-09-04 NOTE — Progress Notes (Signed)
Dan Reddish, MD Phone: [phone]  Subjective:  Patient presents today to establish care. Chief complaint-noted.   See problem oriented charting ROS- see HPI. In addition no rectal bleeding, rectal pain, chest pain, shortness of breath, nausea, vomiting. GI symptoms improved with diet change  The following were reviewed and entered/updated in epic: Past Medical History  Diagnosis Date  . Anxiety   . Depression 08/11/2006    Anxiety as well around down market- tough time with job loss in [employer]    . Hyperlipidemia LDL goal < 130 05/24/2010  . Irritable bowel syndrome 08/29/2007    Improved with diet change    . Bleeding hemorrhoid 05/24/2010   Patient Active Problem List   Diagnosis Date Noted  . Left foot drop 09/04/2014    Priority: High  . Contracture of muscle of left upper arm 09/04/2014    Priority: High  . Hyperlipidemia 05/24/2010    Priority: Medium  . Bleeding hemorrhoid 05/24/2010    Priority: Low  . Irritable bowel syndrome 08/29/2007    Priority: Low   Past Surgical History  Procedure Laterality Date  . Appendectomy    . Colonoscopy  05-01-00  . Hernia repair    . Tonsillectomy  1958  . Root canal      Family History  Problem Relation Age of Onset  . Hypertension Mother   . Breast cancer Mother   . Prostate cancer Father     complications from radioactive seeds, colostomy for last 10 years    Medications- reviewed and updated No current outpatient prescriptions on file.   No current facility-administered medications for this visit.    Allergies-reviewed and updated No Known Allergies  Social History   Social History  . Marital Status: Single    Spouse Name: N/A  . Number of Children: N/A  . Years of Education: N/A   Occupational History  . Public relations account executive    Social History Main Topics  . Smoking status: Never Smoker   . Smokeless tobacco: None  . Alcohol Use: No  . Drug Use: No  . Sexual Activity: Not Asked   Other  Topics Concern  . None   Social History Narrative   Lives alone. Long term partner - does not live with him.       Works at the [employer]    Formerly Designer, multimedia.    Goes to [employer]      Hobbies: Exercises regularly, relax with friends    ROS--See HPI , otherwise full ROS was completed and negative except as noted above  Objective: BP 124/80 mmHg  Pulse 75  Temp(Src) 99 F (37.2 C)  Wt 204 lb (92.534 kg) Gen: NAD, resting comfortably HEENT: Mucous membranes are moist. Oropharynx normal. TM normal. Eyes: sclera and lids normal, PERRLA Neck: no thyromegaly, no cervical lymphadenopathy CV: RRR no murmurs rubs or gallops Lungs: CTAB no crackles, wheeze, rhonchi Abdomen: soft/nontender/nondistended/normal bowel sounds. No rebound or guarding.  Ext: no edema Skin: warm, dry Neuro: alert and oriented x 4, CN II-XII intact, sensation and reflexes normal throughout- 2+ at biceps and knees bilaterally, 5/5 muscle strength in bilateral upper and lower extremities. Normal finger to nose on right, intention tremor on left. Normal rapid alternating movements. Normal romberg. Left foot occasionally drags when he walks, left arm tends to contract to about 30 degrees if he does not reorient.   Assessment/Plan:  Left foot drop/drag; left arm contracture; tremor in left arm  and leg S: Starting 1 year ago, Left leg tends to drag when walking, has to make concerted effort with exercies. Left arm mild shaking with intention. Has tremor in left leg at times as well. Left hand tends to contract up about 30 degrees if he is standing while right arm remains by his side. Has to straighten arm back up. All issues left sided and started about a year ago. Only thing he remembers around that time as possible trigger- Did get sick after a root canal and strong antibiotics, had complete body weakness and was in bed for prolonged period while on  medicine. Felt aware of left sided issue after that. No trauma or injury.  Despite these issues, leads an active lifestyle.   Started yoga. Has felt well. Doing some weight lifting. Daily exercise and very active job.  A/P: My primary concern is to rule out CVA or mass given all left sided issues (left foot dragging/drop, left sided tremor, contracture left arm) and will obtain MRI brain. If this is negative would get neurological consultation. Patient has minimal risk factors for CVA mainly being age and hyperlipidemia. We will reassess lipids today as well as get fasting CBG and TSH. MS would be a possibility as well. If isolated left arm tremor, would consider essential tremor but given other symptoms, cannot attribute to that.   Follow up by phone about results. Next available cPE to catch up health maintenance.  New patient visit as >3 years from last visit.   Orders Placed This Encounter  Procedures  . MR Brain W Wo Contrast    Standing Status: Future     Number of Occurrences:      Standing Expiration Date: 11/04/2015    Order Specific Question:  Reason for Exam (SYMPTOM  OR DIAGNOSIS REQUIRED)    Answer:  Patient not seen 3 years ago- 1 year, left arm tremor and contracture, left foot drags with walking- rule out CVA or mass    Order Specific Question:  Preferred imaging location?    Answer:  [id number]    Order Specific Question:  Does the patient have a pacemaker or implanted devices?    Answer:  No    Order Specific Question:  What is the patient's sedation requirement?    Answer:  No Sedation  . CBC    [facility]  . Comprehensive metabolic panel    [place]    Order Specific Question:  Has the patient fasted?    Answer:  No  . Lipid panel    [place]    Order Specific Question:  Has the patient fasted?    Answer:  No  . TSH    [place]  . PSA

## 2014-09-09 ENCOUNTER — Telehealth: Payer: Self-pay | Admitting: Family Medicine

## 2014-09-09 ENCOUNTER — Telehealth: Payer: Self-pay | Admitting: Internal Medicine

## 2014-09-09 NOTE — Telephone Encounter (Signed)
See below

## 2014-09-09 NOTE — Telephone Encounter (Signed)
1. Staying well hydrated is the best way to protect his kidneys. Otherwise, not a lot can be done to prevent reactions. Best thing to do would be to seek care if a reaction were to occur.  2. No additional labs needed.

## 2014-09-09 NOTE — Telephone Encounter (Signed)
error 

## 2014-09-09 NOTE — Telephone Encounter (Signed)
Pt made aware of below and given lab results.

## 2014-09-09 NOTE — Telephone Encounter (Signed)
Pt calling to inquire about the following:  1.  He is concerned about being injected with contrast for the MRI of the brain that he is scheduled for on 9/21 at [facility].  Pt wants to know how he should prepare for this test to protect himself from any adverse reactions to the contrast.  Pt states he called GI and was informed that it is rare for patients to have a severe allergic reaction to the contrast but was advised to consult his PCP for his concerns.   2.  Pt had appt to establish care with Dr. Yong Channel on 09/04/14, patient was informed to schedule appt for his annual physical, which is scheduled for 10/30/14.  Pt needs to know if he needs any additional labs prior to his appt on 10/27 for his cpx since labs were done at his establishment appt on 09/04/14.

## 2014-09-10 ENCOUNTER — Telehealth: Payer: Self-pay

## 2014-09-10 NOTE — Telephone Encounter (Signed)
Called and lm for pt to see what pharmacy he wants his Ativan called in to for his MRI

## 2014-09-10 NOTE — Telephone Encounter (Signed)
Pt called this morning stating he is scheduled for his MRI on 09/24/14 and the people told him to contact you so he can have a sedative for this. Please advise

## 2014-09-10 NOTE — Telephone Encounter (Signed)
Please call patient in an ativan 0.5mg  #1 to be taken 30 minutes before procedure. He should not drive for 5-32 hours after this so will need someone to take him to appointment.

## 2014-09-12 NOTE — Telephone Encounter (Signed)
Called pt again to see what pharmacy he would like his Rx to go to.

## 2014-09-16 ENCOUNTER — Other Ambulatory Visit: Payer: Self-pay

## 2014-09-16 MED ORDER — LORAZEPAM 0.5 MG PO TABS: 0.5000 mg | ORAL_TABLET | Freq: Once | ORAL | Status: DC

## 2014-09-24 ENCOUNTER — Ambulatory Visit
Admission: RE | Admit: 2014-09-24 | Discharge: 2014-09-24 | Disposition: A | Discharge status: Home / Self Care | Payer: BLUE CROSS/BLUE SHIELD | Source: Ambulatory Visit | Attending: Family Medicine | Admitting: Family Medicine

## 2014-09-24 DIAGNOSIS — M21372 Foot drop, left foot: Secondary | ICD-10-CM

## 2014-09-24 DIAGNOSIS — M62422 Contracture of muscle, left upper arm: Secondary | ICD-10-CM

## 2014-09-24 MED ORDER — GADOBENATE DIMEGLUMINE 529 MG/ML IV SOLN
20.0000 mL | Freq: Once | INTRAVENOUS | Status: AC | PRN
  Administered 2014-09-24: 20 mL via INTRAVENOUS

## 2014-09-29 ENCOUNTER — Telehealth: Payer: Self-pay | Admitting: Family Medicine

## 2014-09-29 NOTE — Telephone Encounter (Addendum)
Pt would like results of MRI last wed.  Pt aware dr hunter is out today.

## 2014-09-30 ENCOUNTER — Other Ambulatory Visit: Payer: Self-pay | Admitting: Family Medicine

## 2014-09-30 DIAGNOSIS — M21372 Foot drop, left foot: Secondary | ICD-10-CM

## 2014-09-30 NOTE — Telephone Encounter (Signed)
Labs and MRI printed and given to pt.

## 2014-10-03 ENCOUNTER — Telehealth: Payer: Self-pay | Admitting: *Deleted

## 2014-10-03 NOTE — Telephone Encounter (Signed)
Patient walked in with complaints of spider bite. Patient states last Saturday he was walking from his car to his house and felt a 'critter' bite his right ankle. Patient confirms he did not see the insect but thinks it may have been a spider. Currently his ankle is not tender to touch or swollen. Noted to have two to three small red spots on right lateral ankle; non-raised. Patient denies fever, chills, fatigue, nausea, and pain. Patient states he has been able to go to work and bear weight on ankle with no complaints. Spoke with Dr. Sarajane Jews and he advised patient to take OTC antihistamine such as benadryl or take Tylenol, elevate extremity, and apply ice. If systemic symptoms were to occur to call office for appointment. Patient verbalized understanding.

## 2014-10-06 ENCOUNTER — Ambulatory Visit: Payer: BLUE CROSS/BLUE SHIELD | Admitting: Neurology

## 2014-10-17 ENCOUNTER — Ambulatory Visit (INDEPENDENT_AMBULATORY_CARE_PROVIDER_SITE_OTHER): Payer: BLUE CROSS/BLUE SHIELD | Admitting: Neurology

## 2014-10-17 ENCOUNTER — Encounter: Payer: Self-pay | Admitting: Neurology

## 2014-10-17 VITALS — BP 102/80 | HR 68 | Ht 75.0 in | Wt 205.0 lb

## 2014-10-17 DIAGNOSIS — G20 Parkinson's disease: Secondary | ICD-10-CM | POA: Diagnosis not present

## 2014-10-17 DIAGNOSIS — R251 Tremor, unspecified: Secondary | ICD-10-CM | POA: Diagnosis not present

## 2014-10-17 NOTE — Progress Notes (Signed)
Dan [name] was seen today in the movement disorders clinic for neurologic consultation at the request of [name], MD.   The patient presents today for neurologic consultation for left foot drop.  The patient's symptoms started about one year ago when the patient noticed that he began to drag his left leg with walking, especially at work.  He states that the leg feels heavy and he has to remind himself to pick the foot up.  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  His friends noted tremor about a year ago, but he didn't notice it until about 6 months ago when he went to pick up a coffee cup and spilled some coffee.  His partner noted that he could no longer keep up with him when they would take walks.  Specific Symptoms:  Tremor: Yes.  , L arm and hand; rarely L foot Voice: no change Sleep: sleeps well  Vivid Dreams:  No.  Acting out dreams:  Yes.   Wet Pillows: No. Postural symptoms:  No. (does yoga for exercise; does note that has just a little more trouble putting L leg in pants/underwear than in the past but otherwise no issue)  Falls?  No. Bradykinesia symptoms: slow movements and feels like "I am dragging the L side with walking" Loss of smell:  No. Loss of taste:  No. Urinary Incontinence:  No. Difficulty Swallowing:  No. Handwriting, micrographia: No. Trouble with ADL's:  No.  Trouble buttoning clothing: Yes.   (only collar buttons or cuff buttons) Depression:  No. Memory changes:  Minimal issues Hallucinations:  No.  visual distortions: No. N/V:  No. Lightheaded:  No.  Syncope: No. Diplopia:  No. Dyskinesia:  No.  The patient did have an MRI of the brain with and without gadolinium on 09/24/2014 that I did have the opportunity to review.  This was normal.  PREVIOUS MEDICATIONS: none to date  ALLERGIES:   No Known Allergies  CURRENT MEDICATIONS:  Outpatient Encounter Prescriptions as of 10/17/2014  Medication Sig  . [DISCONTINUED] LORazepam (ATIVAN) 0.5 MG tablet Take 1 tablet (0.5 mg total) by mouth once.   No facility-administered encounter medications on file as of 10/17/2014.    PAST MEDICAL HISTORY:   Past Medical History  Diagnosis Date  . Anxiety   . Depression 08/11/2006    Anxiety as well around down market- tough time with job loss in [employer]    . Hyperlipidemia LDL goal < 130 05/24/2010  . Irritable bowel syndrome 08/29/2007    Improved with diet change    . Bleeding hemorrhoid 05/24/2010    PAST SURGICAL HISTORY:   Past Surgical History  Procedure Laterality Date  . Appendectomy    . Colonoscopy  05-01-00  . Hernia repair    . Tonsillectomy  1958  . Root canal      SOCIAL HISTORY:   Social History   Social History  . Marital Status: Single    Spouse Name: N/A  . Number of Children: N/A  . Years of Education: N/A   Occupational History  . Public relations account executive     works at [employer]   Social History Main Topics  . Smoking status: Never Smoker   . Smokeless tobacco: Not on file  . Alcohol Use: 0.0 oz/week  0 Standard drinks or equivalent per week     Comment: once a week  . Drug Use: No  . Sexual Activity: Not on file   Other Topics Concern  . Not on file   Social History Narrative   Lives alone. Long term partner - does not live with him.       Works at the [employer]    Formerly Designer, multimedia.    Goes to [employer]      Hobbies: Exercises regularly, relax with friends    FAMILY HISTORY:   Family Status  Relation Status Death Age  . Brother Alive     HIV controlled  . Brother Alive     HIV controlled  . Sister Alive     healthy  . Mother Alive     Breast cancer  . Father Deceased     64, prostate cancer 10 years prior to death     ROS:  A complete 10 system  review of systems was obtained and was unremarkable apart from what is mentioned above.  PHYSICAL EXAMINATION:    Pt placed into examining shorts for examination  VITALS:   Filed Vitals:   10/17/14 0753  BP: 102/80  Pulse: 68  Height: 6\' 3"  (1.905 m)  Weight: 205 lb (92.987 kg)    GEN:  The patient appears stated age and is in NAD. HEENT:  Normocephalic, atraumatic.  The mucous membranes are moist. The superficial temporal arteries are without ropiness or tenderness. CV:  RRR Lungs:  CTAB Neck/HEME:  There are no carotid bruits bilaterally.  Neurological examination:  Orientation: The patient is alert and oriented x3. Fund of knowledge is appropriate.  Recent and remote memory are intact.  Attention and concentration are normal.    Able to name objects and repeat phrases. Cranial nerves: There is good facial symmetry. Pupils are equal round and reactive to light bilaterally. Fundoscopic exam reveals clear margins bilaterally. Extraocular muscles are intact. The visual fields are full to confrontational testing. The speech is fluent and clear. Soft palate rises symmetrically and there is no tongue deviation. Hearing is intact to conversational tone. Sensation: Sensation is intact to light and pinprick throughout (facial, trunk, extremities). Vibration is intact at the bilateral big toe. There is no extinction with double simultaneous stimulation. There is no sensory dermatomal level identified. Motor: Strength is 5/5 in the bilateral upper and lower extremities.   Shoulder shrug is equal and symmetric.  There is no pronator drift.  No foot drop noted Deep tendon reflexes: Deep tendon reflexes are 2/4 at the bilateral biceps, triceps, brachioradialis, patella and achilles. Plantar responses are downgoing bilaterally.  Movement examination: Tone: There is normal tone in the bilateral upper extremities.  The tone in the lower extremities is normal.  Abnormal movements: There is no tremor  at rest.  There is no tremor with distraction.  There is a rare tremor with ambulation Coordination:  There is  decremation with RAM's, only with heel taps and toe taps on the left Gait and Station: The patient has no difficulty arising out of a deep-seated chair without the use of the hands. The patient's stride length is normal with normal arm swing but there is the emergence of a resting tremor on the L with ambulation.  He is able to run down the hall without trouble.  The patient has a negative pull test.      ASSESSMENT/PLAN:  1.  Parkinsonism  -  long discussion with the patient today.  Told the patient that while he does not meet clinical criteria currently for PD, based on his history and PE,I suspect that he could be in the pre-clinical phase and that he may meet criteria in the next few years.  Much greater than 50% of this visit was spent in counseling with the patient and the family.  Total face to face time:  80 min  -We discussed community resources in the area including patient support groups and community exercise programs for PD and pt education was provided to the patient.  -Talked extensively about importance of safe, CV exercise.  Information given to patient on [employer] biking program and boxing program. 2.  Would like to see the patient back in 6 months, sooner should new neuro issues arise.

## 2014-10-20 ENCOUNTER — Ambulatory Visit: Payer: BLUE CROSS/BLUE SHIELD | Admitting: Neurology

## 2014-10-30 ENCOUNTER — Ambulatory Visit (INDEPENDENT_AMBULATORY_CARE_PROVIDER_SITE_OTHER): Payer: BLUE CROSS/BLUE SHIELD | Admitting: Family Medicine

## 2014-10-30 ENCOUNTER — Other Ambulatory Visit (HOSPITAL_COMMUNITY)
Admission: RE | Admit: 2014-10-30 | Discharge: 2014-10-30 | Disposition: A | Discharge status: Home / Self Care | Payer: BLUE CROSS/BLUE SHIELD | Source: Ambulatory Visit | Attending: Family Medicine | Admitting: Family Medicine

## 2014-10-30 ENCOUNTER — Encounter: Payer: Self-pay | Admitting: Family Medicine

## 2014-10-30 VITALS — BP 128/72 | HR 72 | Temp 98.4°F | Ht 75.0 in | Wt 206.0 lb

## 2014-10-30 DIAGNOSIS — Z113 Encounter for screening for infections with a predominantly sexual mode of transmission: Secondary | ICD-10-CM | POA: Insufficient documentation

## 2014-10-30 DIAGNOSIS — G20 Parkinson's disease: Secondary | ICD-10-CM | POA: Insufficient documentation

## 2014-10-30 DIAGNOSIS — R972 Elevated prostate specific antigen [PSA]: Secondary | ICD-10-CM | POA: Insufficient documentation

## 2014-10-30 DIAGNOSIS — Z0000 Encounter for general adult medical examination without abnormal findings: Secondary | ICD-10-CM

## 2014-10-30 DIAGNOSIS — Z1283 Encounter for screening for malignant neoplasm of skin: Secondary | ICD-10-CM

## 2014-10-30 DIAGNOSIS — Z7251 High risk heterosexual behavior: Secondary | ICD-10-CM

## 2014-10-30 DIAGNOSIS — Z20828 Contact with and (suspected) exposure to other viral communicable diseases: Secondary | ICD-10-CM

## 2014-10-30 LAB — PSA: PSA: 3.47 ng/mL (ref 0.10–4.00)

## 2014-10-30 NOTE — Patient Instructions (Addendum)
We will call you within a week about your referral to dermatology. If you do not hear within 2 weeks, give [place] a call.   Labs before you go  Colonoscopy due 2019  Advise you to see Dr. Berenice Primas  See me at least yearly for a physical  We will decide based off labs if we need to see each other sooner

## 2014-10-30 NOTE — Progress Notes (Signed)
Dan Reddish, MD Phone: [phone]  Subjective:  Patient presents today for their annual physical. Chief complaint-noted.   -suspect he will have parkinson's disease-following with Dr. Carles Collet, handling this information well ROS- full  review of systems was completed and negative except for: left foot drag and left arm tremor and contracture. Nocturia once a night.   The following were reviewed and entered/updated in epic: Past Medical History  Diagnosis Date  . Anxiety   . Depression 08/11/2006    Anxiety as well around down market- tough time with job loss in [employer]    . Hyperlipidemia LDL goal < 130 05/24/2010  . Irritable bowel syndrome 08/29/2007    Improved with diet change    . Bleeding hemorrhoid 05/24/2010   Patient Active Problem List   Diagnosis Date Noted  . Left foot drop 09/04/2014    Priority: High  . Contracture of muscle of left upper arm 09/04/2014    Priority: High  . Hyperlipidemia 05/24/2010    Priority: Medium  . Bleeding hemorrhoid 05/24/2010    Priority: Low  . Irritable bowel syndrome 08/29/2007    Priority: Low  . Elevated PSA 10/30/2014  . Parkinsonism ([place]) 10/30/2014   Past Surgical History  Procedure Laterality Date  . Appendectomy    . Colonoscopy  05-01-00  . Hernia repair    . Tonsillectomy  1958  . Root canal      Family History  Problem Relation Age of Onset  . Hypertension Mother   . Breast cancer Mother   . Prostate cancer Father     complications from radioactive seeds, colostomy for last 10 years    Medications- reviewed and updated No current outpatient prescriptions on file.   No current facility-administered medications for this visit.    Allergies-reviewed and updated No Known Allergies  Social History   Social History  . Marital Status: Single    Spouse Name: N/A  . Number of Children: N/A  . Years of Education: N/A   Occupational History  . 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 Smoking status: Never Smoker   . Smokeless tobacco: None  . Alcohol Use: 0.0 oz/week    0 Standard drinks or equivalent per week     Comment: once a week  . Drug Use: No  . Sexual Activity: Not Asked   Other Topics Concern  . None   Social History Narrative   Lives alone. Long term partner - does not live with him.       Works at the [employer]    Formerly Designer, multimedia.    Goes to [employer]      Hobbies: Exercises regularly, relax with friends    ROS--See HPI   Objective: BP 128/72 mmHg  Pulse 72  Temp(Src) 98.4 F (36.9 C)  Ht 6\' 3"  (1.905 m)  Wt 206 lb (93.441 kg)  BMI 25.75 kg/m2 Gen: NAD, resting comfortably HEENT: Mucous membranes are moist. Oropharynx normal Neck: no thyromegaly CV: RRR no murmurs rubs or gallops Lungs: CTAB no crackles, wheeze, rhonchi Abdomen: soft/nontender/nondistended/normal bowel sounds. No rebound or guarding.  Rectal: normal tone, diffusely enlarged prostate, no masses or tenderness Ext: no edema Skin: warm, dry Neuro: left foot drags with walking, resting tremor on left, moves all extremities, PERRLA  Assessment/Plan:  59 y.o. male presenting for annual physical.  Health Maintenance counseling: 1. Anticipatory guidance: Patient counseled  regarding regular dental exams, eye exams, wearing seatbelts, wearing sunscreen 2. Risk factor reduction:  Advised patient of need for regular exercise and diet rich and fruits and vegetables to reduce risk of heart attack and stroke. Doing exercise as recommended by Dr. Carles Collet for parkinsonism features.  3. Immunizations/screenings/ancillary studies- already received flu shot  Health Maintenance Due  Topic Date Due  . Hepatitis C Screening  Sep 23, 1955  . HIV Screening  09/13/1970  4. Prostate cancer screening- Suspect BPH (nocturia once a night) as cause of elevated PSA but will check level again today and  depending on trend decide on follow up 5. Colon cancer screening - 09/04/2007 with 10 year repeat 6. Skin cancer screening- requests dermatology referral. Prefers male dermatologist   Hyperlipidemia- 10 year risk 7.2%, no rx Lab Results  Component Value Date   CHOL 172 09/04/2014   HDL 38.50* 09/04/2014   LDLCALC 113* 09/04/2014   TRIG 106.0 09/04/2014   CHOLHDL 4 09/04/2014   Mild anxiety, low energy. No anhedonia. Feelings at times of being down. No SI/HI. Do not see specific diagnosis for depression or GAD but patient I think would still benefit from counseling.  Referred to Dr. Berenice Primas  Return precautions advised.   Orders Placed This Encounter  Procedures  . PSA  . Hep C Antibody  . HIV antibody    [facility]  . RPR    [facility]  . Ambulatory referral to Dermatology    Referral Priority:  Routine    Referral Type:  Consultation    Referral Reason:  Specialty Services Required    Requested Specialty:  Dermatology    Number of Visits Requested:  1

## 2014-10-31 LAB — URINE CYTOLOGY ANCILLARY ONLY
CHLAMYDIA, DNA PROBE: NEGATIVE
NEISSERIA GONORRHEA: NEGATIVE
TRICH (WINDOWPATH): NEGATIVE

## 2014-10-31 LAB — HIV ANTIBODY (ROUTINE TESTING W REFLEX): HIV 1&2 Ab, 4th Generation: NONREACTIVE

## 2014-10-31 LAB — HEPATITIS C ANTIBODY: HCV Ab: NEGATIVE

## 2014-10-31 LAB — RPR

## 2015-01-13 ENCOUNTER — Encounter: Payer: Self-pay | Admitting: Gastroenterology

## 2015-04-08 ENCOUNTER — Telehealth: Payer: Self-pay | Admitting: Family Medicine

## 2015-04-08 NOTE — Telephone Encounter (Signed)
Yes, do free and total and must be between 8-10 AM- abnormal results would prompt office visit. Also check CBC and TSH please.

## 2015-04-08 NOTE — Telephone Encounter (Signed)
Pt has been sch

## 2015-04-08 NOTE — Telephone Encounter (Signed)
See below

## 2015-04-08 NOTE — Telephone Encounter (Signed)
Pt has notice a decrease in energy level and would like to have his testosterone level check. Can I sch?

## 2015-04-09 ENCOUNTER — Other Ambulatory Visit (INDEPENDENT_AMBULATORY_CARE_PROVIDER_SITE_OTHER): Payer: BLUE CROSS/BLUE SHIELD

## 2015-04-09 DIAGNOSIS — R5383 Other fatigue: Secondary | ICD-10-CM | POA: Diagnosis not present

## 2015-04-09 DIAGNOSIS — L719 Rosacea, unspecified: Secondary | ICD-10-CM | POA: Diagnosis not present

## 2015-04-10 DIAGNOSIS — R5383 Other fatigue: Secondary | ICD-10-CM | POA: Diagnosis not present

## 2015-04-10 LAB — CBC WITH DIFFERENTIAL/PLATELET
BASOS PCT: 0.4 % (ref 0.0–3.0)
Basophils Absolute: 0 10*3/uL (ref 0.0–0.1)
EOS ABS: 0.1 10*3/uL (ref 0.0–0.7)
Eosinophils Relative: 1.3 % (ref 0.0–5.0)
HEMATOCRIT: 43.9 % (ref 39.0–52.0)
HEMOGLOBIN: 15 g/dL (ref 13.0–17.0)
LYMPHS PCT: 28.4 % (ref 12.0–46.0)
Lymphs Abs: 1.9 10*3/uL (ref 0.7–4.0)
MCHC: 34.2 g/dL (ref 30.0–36.0)
MCV: 91.8 fl (ref 78.0–100.0)
MONOS PCT: 9.4 % (ref 3.0–12.0)
Monocytes Absolute: 0.6 10*3/uL (ref 0.1–1.0)
Neutro Abs: 4 10*3/uL (ref 1.4–7.7)
Neutrophils Relative %: 60.5 % (ref 43.0–77.0)
Platelets: 194 10*3/uL (ref 150.0–400.0)
RBC: 4.78 Mil/uL (ref 4.22–5.81)
RDW: 13.7 % (ref 11.5–15.5)
WBC: 6.7 10*3/uL (ref 4.0–10.5)

## 2015-04-10 LAB — VITAMIN D 25 HYDROXY (VIT D DEFICIENCY, FRACTURES): VITD: 27.12 ng/mL — AB (ref 30.00–100.00)

## 2015-04-10 LAB — TSH: TSH: 1.56 u[IU]/mL (ref 0.35–4.50)

## 2015-04-10 LAB — VITAMIN B12: Vitamin B-12: 427 pg/mL (ref 211–911)

## 2015-04-13 ENCOUNTER — Encounter: Payer: Self-pay | Admitting: Family Medicine

## 2015-04-13 LAB — TESTOSTERONE: TESTOSTERONE, FREE: 6.3

## 2015-04-16 ENCOUNTER — Telehealth: Payer: Self-pay | Admitting: Family Medicine

## 2015-04-16 ENCOUNTER — Ambulatory Visit (INDEPENDENT_AMBULATORY_CARE_PROVIDER_SITE_OTHER): Payer: BLUE CROSS/BLUE SHIELD | Admitting: Family Medicine

## 2015-04-16 ENCOUNTER — Encounter: Payer: Self-pay | Admitting: Family Medicine

## 2015-04-16 VITALS — BP 120/84 | HR 72 | Temp 98.7°F | Wt 206.0 lb

## 2015-04-16 DIAGNOSIS — E291 Testicular hypofunction: Secondary | ICD-10-CM

## 2015-04-16 DIAGNOSIS — N400 Enlarged prostate without lower urinary tract symptoms: Secondary | ICD-10-CM | POA: Diagnosis not present

## 2015-04-16 DIAGNOSIS — R7989 Other specified abnormal findings of blood chemistry: Secondary | ICD-10-CM | POA: Insufficient documentation

## 2015-04-16 DIAGNOSIS — R972 Elevated prostate specific antigen [PSA]: Secondary | ICD-10-CM | POA: Diagnosis not present

## 2015-04-16 MED ORDER — VITAMIN D (ERGOCALCIFEROL) 1.25 MG (50000 UNIT) PO CAPS: 50000.0000 [IU] | ORAL_CAPSULE | ORAL | Status: DC

## 2015-04-16 NOTE — Telephone Encounter (Signed)
Pt would like to know why his Rx  Vitamin D, Ergocalciferol, (DRISDOL) 50000 units CAPS capsule Is a D2 and not a D3. Pt has not picked up Rx yet in case this is incorrect.

## 2015-04-16 NOTE — Assessment & Plan Note (Signed)
Doubt vitamin D of 27 causing his symptoms but will do 8 weeks 50k units then resume 800 -1000 units daily.

## 2015-04-16 NOTE — Progress Notes (Signed)
[name], MD  Subjective:  Dan [name] is a 60 y.o. year old very pleasant male patient who presents for/with See problem oriented charting ROS- ROS- no chest pain or shortness of breath- no exertional component. No unintentional weight loss, fever, chills, night sweats  Past Medical History-  Patient Active Problem List   Diagnosis Date Noted  . Left foot drop 09/04/2014    Priority: High  . Contracture of muscle of left upper arm 09/04/2014    Priority: High  . Hyperlipidemia 05/24/2010    Priority: Medium  . Bleeding hemorrhoid 05/24/2010    Priority: Low  . Irritable bowel syndrome 08/29/2007    Priority: Low  . Elevated PSA 10/30/2014  . Parkinsonism ([place]) 10/30/2014    Medications- reviewed and updated Current Outpatient Prescriptions  Medication Sig Dispense Refill  . doxycycline (PERIOSTAT) 20 MG tablet Take 1 tablet by mouth 2 (two) times daily.  3  . Vitamin D, Ergocalciferol, (DRISDOL) 50000 units CAPS capsule Take 1 capsule (50,000 Units total) by mouth every 7 (seven) days. 8 capsule 0   No current facility-administered medications for this visit.    Objective: BP 120/84 mmHg  Pulse 72  Temp(Src) 98.7 F (37.1 C) (Oral)  Wt 206 lb (93.441 kg)  SpO2 96% Gen: NAD, resting comfortably CV: RRR no murmurs rubs or gallops Lungs: CTAB no crackles, wheeze, rhonchi Abdomen: soft/nontender/nondistended/normal bowel sounds. No rebound or guarding.  Ext: no edema Skin: warm, dry Neuro: tremor at rest more prominent on left, contracture on left less apparent than previous  Assessment/Plan:  Elevated PSA Low testosterone BPH (likely source of elevated PSA S: Goes to gym MWF with parkinson trainer before work. Also walks a lot for his job. Started noticing fatigue 2-3 months ago not everyday. He is a day person but struggled from 6-8 pm and wanted to go to bed. Sees Dr. Carles Collet next week. Fatigue has been stable since onset and not worsening. He also has felt  more irritable, like he couldn't focus as well, and sometimes depressed mood. Denies SI/HI. Trainer mentioned a few labs that should be tested and patient called in. We are following up on findings today. In addition patient has had elevated PSA which trended back down at last check. Does have BPH on prior exams Lab Results  Component Value Date   PSA 3.47 10/30/2014   PSA 5.01* 09/04/2014   PSA 3.62 11/19/2010  A/P: Although I suspect elevated PSA is due to BPH, told patient I thought with elevated PSA would prefer management for low testosterone under urology. I have referred at this time. For some reason testosterone results have not loaded into epic and still waiting on abstraction. His total testosterone was 161 and free testosterone was 6.3- both low.   Return precautions advised, firm precautions if worsening depressed mood or if SI/HI.    Orders Placed This Encounter  Procedures  . Ambulatory referral to Urology    Referral Priority:  Routine    Referral Type:  Consultation    Referral Reason:  Specialty Services Required    Requested Specialty:  Urology    Number of Visits Requested:  1    Meds ordered this encounter  Medications  . doxycycline (PERIOSTAT) 20 MG tablet    Sig: Take 1 tablet by mouth 2 (two) times daily.    Refill:  3  . Vitamin D, Ergocalciferol, (DRISDOL) 50000 units CAPS capsule    Sig: Take 1 capsule (50,000 Units total) by mouth every 7 (  seven) days.    Dispense:  8 capsule    Refill:  0   The duration of face-to-face time during this visit was 25 minutes. Greater than 50% of this time was spent in counseling, explanation of diagnosis, planning of further management, and/or coordination of care.

## 2015-04-16 NOTE — Assessment & Plan Note (Signed)
Low testosterone BPH (likely source of elevated PSA S: Goes to gym MWF with parkinson trainer before work. Also walks a lot for his job. Started noticing fatigue 2-3 months ago not everyday. He is a day person but struggled from 6-8 pm and wanted to go to bed. Sees Dr. Carles Collet next week. Fatigue has been stable since onset and not worsening. He also has felt more irritable, like he couldn't focus as well, and sometimes depressed mood. Denies SI/HI. Trainer mentioned a few labs that should be tested and patient called in. We are following up on findings today. In addition patient has had elevated PSA which trended back down at last check. Does have BPH on prior exams Lab Results  Component Value Date   PSA 3.47 10/30/2014   PSA 5.01* 09/04/2014   PSA 3.62 11/19/2010  A/P: Although I suspect elevated PSA is due to BPH, told patient I thought with elevated PSA would prefer management for low testosterone under urology. I have referred at this time. For some reason testosterone results have not loaded into epic and still waiting on abstraction. His total testosterone was 161 and free testosterone was 6.3- both low.

## 2015-04-16 NOTE — Telephone Encounter (Signed)
It is correct. There is no D2 in this large form per prior discussions with pharmacy

## 2015-04-16 NOTE — Progress Notes (Signed)
Pre visit review using our clinic review tool, if applicable. No additional management support is needed unless otherwise documented below in the visit note. 

## 2015-04-16 NOTE — Telephone Encounter (Signed)
See below

## 2015-04-16 NOTE — Patient Instructions (Signed)
Vitamin D is low. Treat with 50000 units once a week for 8 weeks. Make sure your multivitamin has at least 800 units of vitamin D.   We will call you within a week about your referral to urology for low testosterone, slightly high PSA likely due to enlarged prostate but with testosterone treatment- needs to be monitored closely. If you do not hear within 2 weeks, give [place] a call.

## 2015-04-21 NOTE — Telephone Encounter (Signed)
Called and lm on pt vm with below message. 

## 2015-04-22 ENCOUNTER — Ambulatory Visit: Payer: BLUE CROSS/BLUE SHIELD | Admitting: Neurology

## 2015-04-23 ENCOUNTER — Encounter: Payer: Self-pay | Admitting: Neurology

## 2015-04-23 ENCOUNTER — Telehealth: Payer: Self-pay | Admitting: Neurology

## 2015-04-23 ENCOUNTER — Ambulatory Visit (INDEPENDENT_AMBULATORY_CARE_PROVIDER_SITE_OTHER): Payer: BLUE CROSS/BLUE SHIELD | Admitting: Neurology

## 2015-04-23 VITALS — BP 120/80 | HR 74 | Ht 75.0 in | Wt 206.0 lb

## 2015-04-23 DIAGNOSIS — G20 Parkinson's disease: Secondary | ICD-10-CM | POA: Diagnosis not present

## 2015-04-23 NOTE — Telephone Encounter (Signed)
Left message on machine for patient to call back.

## 2015-04-23 NOTE — Progress Notes (Signed)
Dan [name] was seen today in the movement disorders clinic for neurologic consultation at the request of [name], MD.   The patient presents today for neurologic consultation for left foot drop.  The patient's symptoms started about one year ago when the patient noticed that he began to drag his left leg with walking, especially at work.  He states that the leg feels heavy and he has to remind himself to pick the foot up.  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  His friends noted tremor about a year ago, but he didn't notice it until about 6 months ago when he went to pick up a coffee cup and spilled some coffee.  His partner noted that he could no longer keep up with him when they would take walks.  04/23/15 update:  The patient is here today for six-month follow-up.  I have reviewed records made available to me.  He has been working out with a trainer Monday, Wednesday, and Friday at [facility]).  He has noted that the L arm wants to "pull inward."  He has recently been noticing more fatigue.  He had some labwork in that regard.  His TSH was normal.  His B12 was normal at 427.  His vitamin D was slightly low and he was recently started on supplementation.  These labs were done on 04/10/2015.  He has not had any falls since last visit.  No lightheadedness or near syncope.  No hallucinations.  He continues to work at [employer] on [employer] but he puts in 12,000 steps a day.  He notes L arm tremor and feels an inner tremor in the L but no outer tremor.  Sometimes L foot will catch on the ground.    PREVIOUS MEDICATIONS: none to date  ALLERGIES:  No Known Allergies  CURRENT MEDICATIONS:  Outpatient Encounter Prescriptions as of 04/23/2015  Medication Sig  . doxycycline (PERIOSTAT) 20 MG tablet Take 1 tablet by mouth 2 (two)  times daily.  . Vitamin D, Ergocalciferol, (DRISDOL) 50000 units CAPS capsule Take 1 capsule (50,000 Units total) by mouth every 7 (seven) days.   No facility-administered encounter medications on file as of 04/23/2015.    PAST MEDICAL HISTORY:   Past Medical History  Diagnosis Date  . Anxiety   . Depression 08/11/2006    Anxiety as well around down market- tough time with job loss in [employer]    . Hyperlipidemia LDL goal < 130 05/24/2010  . Irritable bowel syndrome 08/29/2007    Improved with diet change    . Bleeding hemorrhoid 05/24/2010    PAST SURGICAL HISTORY:   Past Surgical History  Procedure Laterality Date  . Appendectomy    . Colonoscopy  05-01-00  . Hernia repair    . Tonsillectomy  1958  . Root canal      SOCIAL HISTORY:   Social History   Social History  . Marital Status: Single    Spouse Name: N/A  . Number of Children: N/A  . Years of Education: N/A   Occupational History  . Public relations account executive     works at [employer]   Social History Main Topics  . Smoking status: Never Smoker   . Smokeless tobacco: Not on file  . Alcohol  Use: 0.0 oz/week    0 Standard drinks or equivalent per week     Comment: once a week  . Drug Use: No  . Sexual Activity: Not on file   Other Topics Concern  . Not on file   Social History Narrative   Lives alone. Long term partner - does not live with him.       Works at the [employer]    Formerly Designer, multimedia.    Goes to [employer]      Hobbies: Exercises regularly, relax with friends    FAMILY HISTORY:   Family Status  Relation Status Death Age  . Brother Alive     HIV controlled  . Brother Alive     HIV controlled  . Sister Alive     healthy  . Mother Alive     Breast cancer  . Father Deceased     4, prostate cancer 10 years prior to death     ROS:  A complete 10 system review of systems was obtained and was unremarkable apart from  what is mentioned above.  PHYSICAL EXAMINATION:    Pt placed into examining shorts for examination  VITALS:   There were no vitals filed for this visit.  GEN:  The patient appears stated age and is in NAD. HEENT:  Normocephalic, atraumatic.  The mucous membranes are moist. The superficial temporal arteries are without ropiness or tenderness. CV:  RRR Lungs:  CTAB Neck/HEME:  There are no carotid bruits bilaterally.  Neurological examination:  Orientation: The patient is alert and oriented x3. Fund of knowledge is appropriate.  Recent and remote memory are intact.  Attention and concentration are normal.    Able to name objects and repeat phrases. Cranial nerves: There is good facial symmetry. Pupils are equal round and reactive to light bilaterally. Fundoscopic exam reveals clear margins bilaterally. Extraocular muscles are intact. The visual fields are full to confrontational testing. The speech is fluent and clear. Soft palate rises symmetrically and there is no tongue deviation. Hearing is intact to conversational tone. Sensation: Sensation is intact to light and pinprick throughout (facial, trunk, extremities). Vibration is intact at the bilateral big toe. There is no extinction with double simultaneous stimulation. There is no sensory dermatomal level identified. Motor: Strength is 5/5 in the bilateral upper and lower extremities.   Shoulder shrug is equal and symmetric.  There is no pronator drift.  No foot drop noted   Movement examination: Tone: There is mild increased tone in the LUE Abnormal movements: There is L arm tremor Coordination:  There is  decremation with RAM's, with hand opening and closing, finger taps, heel taps and toe taps on the L Gait and Station: The patient has no difficulty arising out of a deep-seated chair without the use of the hands. The patient's stride length is normal with normal arm swing but there is the emergence of a resting tremor on the L with  ambulation.  He is able to run down the hall without trouble.  The patient has a negative pull test.      ASSESSMENT/PLAN:  1.  Parkinsonism  -I do think that he meets the criteria for idiopathic parkinsons disease now.  We discussed this today in detail.  I recommended mirapex today but he wants to think about this.  Discussed extensively r/b/se, including compulsive behaviors.  He and I discussed this and he will be seen in 3 month and wants  to hold the medication.  -He is working with a Clinical research associate m/w/f and I congratulated him on all the exercise he is doing.  Encouraged to increase the CV exercise 2.  F/u in 3 months.  Much greater than 50% of this visit was spent in counseling and coordinating care.  Total face to face time:  45 months    u

## 2015-04-23 NOTE — Telephone Encounter (Signed)
PT called in regards to the medication that Dr Tat had mentioned/Dawn CB# [phone]

## 2015-04-24 NOTE — Telephone Encounter (Signed)
Patient wanted to know the name of the medication they discussed - made aware this was Mirapex. He will call with any other questions.

## 2015-05-27 ENCOUNTER — Telehealth: Payer: Self-pay | Admitting: Family Medicine

## 2015-05-27 NOTE — Telephone Encounter (Signed)
From AVS given to him at time of referral  "We will call you within a week about your referral to urology for low testosterone, slightly high PSA likely due to enlarged prostate but with testosterone treatment- needs to be monitored closely. If you do not hear within 2 weeks, give [place] a call. "  Basically with history of high PSA and on testosterone treatment- elevated risk of prostate cancer so this should be managed by urology. I am fine if he wants to call for a different physician- he can call the practice himself (he did not give me this information before referral as far as I know).

## 2015-05-27 NOTE — Telephone Encounter (Signed)
Pt would like to be referred to a different urologist (can it be one outside of [facility] if possible?) did not care for Dr. Jeffie Pollock.  Pt would like to know the reason for him going to the urologist

## 2015-05-28 ENCOUNTER — Encounter: Payer: Self-pay | Admitting: Pediatrics

## 2015-05-28 NOTE — Telephone Encounter (Signed)
Pt scheduled for 06/11/2015@1 :15 pm Dr Junious Silk

## 2015-05-28 NOTE — Telephone Encounter (Signed)
Left message for patient to return call.

## 2015-05-28 NOTE — Telephone Encounter (Signed)
Dr. Yong Channel notified of pt's appt.

## 2015-05-28 NOTE — Telephone Encounter (Signed)
Left message on voicemail to call office.  

## 2015-05-28 NOTE — Telephone Encounter (Signed)
Left message for patient to call back. Appt reminder letter mailed to patient.

## 2015-05-28 NOTE — Telephone Encounter (Signed)
Thanks for update- if we cannot reach him within next day- reasonable to send letter- really do not want him missing appointment

## 2015-05-29 ENCOUNTER — Telehealth: Payer: Self-pay | Admitting: Family Medicine

## 2015-05-29 NOTE — Telephone Encounter (Signed)
Spoke with pt and he is aware of his appt on 06/11/15 with Dr Junious Silk at [facility]

## 2015-06-04 ENCOUNTER — Telehealth: Payer: Self-pay | Admitting: Family Medicine

## 2015-06-04 NOTE — Telephone Encounter (Signed)
Left message for patient to call back  

## 2015-06-04 NOTE — Telephone Encounter (Signed)
Pt received a letter stating he has a appointment on 06/11/15 with Dr. Junious Silk at [facility] he Dan not be in the country on that day.  Pt stated that they have him listed as "Dan [name]" and that is not him.  Pt would like to have a call back.

## 2015-06-04 NOTE — Telephone Encounter (Signed)
I called patient and left message for return call.  If patient is not able to keep 6/8 appt w/ urology he will need to contact that office to reschedule.  As far as the name, I may have merged Brylin's name with Dr Yong Channel. That is my mistake.

## 2015-06-05 NOTE — Telephone Encounter (Signed)
Spoke with pt and he said his appt has been rescheduled until July

## 2015-07-16 ENCOUNTER — Ambulatory Visit: Payer: BLUE CROSS/BLUE SHIELD | Admitting: Neurology

## 2015-07-16 ENCOUNTER — Encounter: Payer: Self-pay | Admitting: Neurology

## 2015-07-16 ENCOUNTER — Ambulatory Visit (INDEPENDENT_AMBULATORY_CARE_PROVIDER_SITE_OTHER): Payer: BLUE CROSS/BLUE SHIELD | Admitting: Neurology

## 2015-07-16 VITALS — BP 120/80 | HR 64 | Ht 75.0 in | Wt 205.0 lb

## 2015-07-16 DIAGNOSIS — L719 Rosacea, unspecified: Secondary | ICD-10-CM | POA: Diagnosis not present

## 2015-07-16 DIAGNOSIS — G20 Parkinson's disease: Secondary | ICD-10-CM

## 2015-07-16 DIAGNOSIS — L700 Acne vulgaris: Secondary | ICD-10-CM | POA: Diagnosis not present

## 2015-07-16 NOTE — Progress Notes (Signed)
Dan [name] was seen today in the movement disorders clinic for neurologic consultation at the request of [name], MD.   The patient presents today for neurologic consultation for left foot drop.  The patient's symptoms started about one year ago when the patient noticed that he began to drag his left leg with walking, especially at work.  He states that the leg feels heavy and he has to remind himself to pick the foot up.  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  His friends noted tremor about a year ago, but he didn't notice it until about 6 months ago when he went to pick up a coffee cup and spilled some coffee.  His partner noted that he could no longer keep up with him when they would take walks.  04/23/15 update:  The patient is here today for six-month follow-up.  I have reviewed records made available to me.  He has been working out with a trainer Monday, Wednesday, and Friday at [facility]).  He has noted that the L arm wants to "pull inward."  He has recently been noticing more fatigue.  He had some labwork in that regard.  His TSH was normal.  His B12 was normal at 427.  His vitamin D was slightly low and he was recently started on supplementation.  These labs were done on 04/10/2015.  He has not had any falls since last visit.  No lightheadedness or near syncope.  No hallucinations.  He continues to work at [employer] on [employer] but he puts in 12,000 steps a day.  He notes L arm tremor and feels an inner tremor in the L but no outer tremor.  Sometimes L foot will catch on the ground.    07/16/15 update:  The patient presents today for follow-up.  Last visit, the patient was formally diagnosed with idiopathic Parkinson's disease.  I felt that he would benefit from a dopamine agonist, but he wanted to hold on that.   Since our last visit, the patient feels that he has overall been doing well.  He has not had any falls.  No hallucinations.  No lightheadedness or near syncope.  He is going to the [facility] gym 3 days a week.  Dx with Vit D deficiency and started on replacement and that helped with fatigue.  Saw dermatology (Dr. Renda Rolls) today and had skin examination  PREVIOUS MEDICATIONS: none to date  ALLERGIES:  No Known Allergies  CURRENT MEDICATIONS:  Outpatient Encounter Prescriptions as of 07/16/2015  Medication Sig  . B Complex Vitamins (VITAMIN B COMPLEX PO) Take by mouth.  . cholecalciferol (VITAMIN D) 1000 units tablet Take 1,000 Units by mouth daily.  Marland Kitchen doxycycline (PERIOSTAT) 20 MG tablet Take 1 tablet by mouth 2 (two) times daily.  Marland Kitchen glucosamine-chondroitin 500-400 MG tablet Take 1 tablet by mouth 3 (three) times daily.  Marland Kitchen LYSINE PO Take by mouth daily.  . Multiple Vitamin (MULTIVITAMIN) tablet Take 1 tablet by mouth daily.  . [DISCONTINUED] Vitamin D, Ergocalciferol, (DRISDOL) 50000 units CAPS capsule Take 1 capsule (50,000 Units total) by mouth every 7 (seven) days.   No facility-administered encounter medications on file as of 07/16/2015.    PAST MEDICAL HISTORY:   Past Medical History  Diagnosis Date  .  Anxiety   . Depression 08/11/2006    Anxiety as well around down market- tough time with job loss in [employer]    . Hyperlipidemia LDL goal < 130 05/24/2010  . Irritable bowel syndrome 08/29/2007    Improved with diet change    . Bleeding hemorrhoid 05/24/2010    PAST SURGICAL HISTORY:   Past Surgical History  Procedure Laterality Date  . Appendectomy    . Colonoscopy  05-01-00  . Hernia repair    . Tonsillectomy  1958  . Root canal      SOCIAL HISTORY:   Social History   Social History  . Marital Status: Single    Spouse Name: N/A  . Number of Children: N/A  . Years of Education: N/A   Occupational History  . 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 Smoking status: Never Smoker   . Smokeless tobacco: Not on file  . Alcohol Use: 0.0 oz/week    0 Standard drinks or equivalent per week     Comment: once a week  . Drug Use: No  . Sexual Activity: Not on file   Other Topics Concern  . Not on file   Social History Narrative   Lives alone. Long term partner - does not live with him.       Works at the [employer]    Formerly Designer, multimedia.    Goes to [employer]      Hobbies: Exercises regularly, relax with friends    FAMILY HISTORY:   Family Status  Relation Status Death Age  . Brother Alive     HIV controlled  . Brother Alive     HIV controlled  . Sister Alive     healthy  . Mother Alive     Breast cancer  . Father Deceased     90, prostate cancer 10 years prior to death     ROS:  A complete 10 system review of systems was obtained and was unremarkable apart from what is mentioned above.  PHYSICAL EXAMINATION:    Pt placed into examining shorts for examination  VITALS:   Filed Vitals:   07/16/15 1512  BP: 120/80  Pulse: 64  Height: 6\' 3"  (1.905 m)  Weight: 205 lb (92.987 kg)    GEN:  The patient appears stated age and is in NAD. HEENT:  Normocephalic, atraumatic.  The mucous membranes are moist. The superficial temporal arteries are without ropiness or tenderness. CV:  RRR Lungs:  CTAB Neck/HEME:  There are no carotid bruits bilaterally.  Neurological examination:  Orientation: The patient is alert and oriented x3. Fund of knowledge is appropriate.  Recent and remote memory are intact.  Attention and concentration are normal.    Able to name objects and repeat phrases. Cranial nerves: There is good facial symmetry. Pupils are equal round and reactive to light bilaterally. Fundoscopic exam reveals clear margins bilaterally. Extraocular muscles are intact. The visual fields are full to confrontational testing. The speech is  fluent and clear. Soft palate rises symmetrically and there is no tongue deviation. Hearing is intact to conversational tone. Sensation: Sensation is intact to light and pinprick throughout (facial, trunk, extremities). Vibration is intact at the bilateral big toe. There is no extinction with double simultaneous stimulation. There is no sensory dermatomal level identified. Motor: Strength is 5/5 in the bilateral upper and lower extremities.   Shoulder  shrug is equal and symmetric.  There is no pronator drift.  No foot drop noted   Movement examination: Tone: There is mild increased tone in the LUE/RUE only with activation Abnormal movements: There is L arm tremor Coordination:  There is  decremation with RAM's, with hand opening and closing, finger taps, heel taps and toe taps on the L Gait and Station: The patient has no difficulty arising out of a deep-seated chair without the use of the hands. The patient's stride length is normal with normal arm swing but there is the emergence of a resting tremor on the L with ambulation.  He is able to run down the hall without trouble.  The patient has a negative pull test.      ASSESSMENT/PLAN:  1.  Parkinsonism  -I do think that he meets the criteria for idiopathic parkinsons disease now.  We discussed this today in detail.  I recommended mirapex today but he wants to think about this.  Discussed extensively r/b/se, including compulsive behaviors.  He and I discussed this and he will be seen in 3 month and wants to hold the medication.  -He is working with a Clinical research associate m/w/f and I congratulated him on all the exercise he is doing.  Encouraged to increase the CV exercise  -gave him information on rock steady boxing and gave him information on the scholarship program 2.  F/u in 4-5 months.  Much greater than 50% of this visit was spent in counseling and coordinating care.  Total face to face time:  25 months    u

## 2015-07-16 NOTE — Patient Instructions (Addendum)
For information about the Parkinson's Exercise Scholarship contact:  Phillis Haggis [phone] [email or web address]

## 2015-07-23 DIAGNOSIS — N400 Enlarged prostate without lower urinary tract symptoms: Secondary | ICD-10-CM | POA: Diagnosis not present

## 2015-07-23 DIAGNOSIS — E291 Testicular hypofunction: Secondary | ICD-10-CM | POA: Diagnosis not present

## 2015-07-23 DIAGNOSIS — Z125 Encounter for screening for malignant neoplasm of prostate: Secondary | ICD-10-CM | POA: Diagnosis not present

## 2015-07-23 DIAGNOSIS — R972 Elevated prostate specific antigen [PSA]: Secondary | ICD-10-CM | POA: Diagnosis not present

## 2015-08-27 DIAGNOSIS — E291 Testicular hypofunction: Secondary | ICD-10-CM | POA: Diagnosis not present

## 2015-09-17 DIAGNOSIS — M722 Plantar fascial fibromatosis: Secondary | ICD-10-CM | POA: Diagnosis not present

## 2015-09-17 DIAGNOSIS — M21622 Bunionette of left foot: Secondary | ICD-10-CM | POA: Diagnosis not present

## 2015-09-17 DIAGNOSIS — M79671 Pain in right foot: Secondary | ICD-10-CM | POA: Diagnosis not present

## 2015-09-17 DIAGNOSIS — M21621 Bunionette of right foot: Secondary | ICD-10-CM | POA: Diagnosis not present

## 2015-10-01 DIAGNOSIS — M722 Plantar fascial fibromatosis: Secondary | ICD-10-CM | POA: Diagnosis not present

## 2015-10-01 DIAGNOSIS — M21622 Bunionette of left foot: Secondary | ICD-10-CM | POA: Diagnosis not present

## 2015-10-01 DIAGNOSIS — M21621 Bunionette of right foot: Secondary | ICD-10-CM | POA: Diagnosis not present

## 2015-10-08 DIAGNOSIS — E291 Testicular hypofunction: Secondary | ICD-10-CM | POA: Diagnosis not present

## 2015-10-10 DIAGNOSIS — Z23 Encounter for immunization: Secondary | ICD-10-CM | POA: Diagnosis not present

## 2015-10-20 NOTE — Progress Notes (Signed)
Dan [name] was seen today in the movement disorders clinic for neurologic consultation at the request of [name], MD.   The patient presents today for neurologic consultation for left foot drop.  The patient's symptoms started about one year ago when the patient noticed that he began to drag his left leg with walking, especially at work.  He states that the leg feels heavy and he has to remind himself to pick the foot up.  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  His friends noted tremor about a year ago, but he didn't notice it until about 6 months ago when he went to pick up a coffee cup and spilled some coffee.  His partner noted that he could no longer keep up with him when they would take walks.  04/23/15 update:  The patient is here today for six-month follow-up.  I have reviewed records made available to me.  He has been working out with a trainer Monday, Wednesday, and Friday at [facility]).  He has noted that the L arm wants to "pull inward."  He has recently been noticing more fatigue.  He had some labwork in that regard.  His TSH was normal.  His B12 was normal at 427.  His vitamin D was slightly low and he was recently started on supplementation.  These labs were done on 04/10/2015.  He has not had any falls since last visit.  No lightheadedness or near syncope.  No hallucinations.  He continues to work at [employer] on [employer] but he puts in 12,000 steps a day.  He notes L arm tremor and feels an inner tremor in the L but no outer tremor.  Sometimes L foot will catch on the ground.    07/16/15 update:  The patient presents today for follow-up.  Last visit, the patient was formally diagnosed with idiopathic Parkinson's disease.  I felt that he would benefit from a dopamine agonist, but he wanted to hold on that.   Since our last visit, the patient feels that he has overall been doing well.  He has not had any falls.  No hallucinations.  No lightheadedness or near syncope.  He is going to the [facility] 3 days a week.  Dx with Vit D deficiency and started on replacement and that helped with fatigue.  Saw dermatology (Dr. Renda Rolls) today and had skin examination  10/22/15 update:  The patient follows up today.  He has a history of Parkinson's disease.  He has wanted no medication for this.  He has noted increased tremor in the L arm.  He is exercising at the [facility] 3 days a week. He does state that he has had plantar fasciitis in the L foot.  He saw podiatry and had a cortisone injection and it is helping.  He has seen his PCP.  He has had labs and his B12, testosterone, vit D is normal.   He has not had any falls.  No hallucinations.  No lightheadedness or near syncope.  Mood is improved.  He attributes this to the fact that his mother has been on list for friends home [place] and she is moving in first week of November.    PREVIOUS MEDICATIONS: none to date  ALLERGIES:  No  Known Allergies  CURRENT MEDICATIONS:  Outpatient Encounter Prescriptions as of 10/22/2015  Medication Sig  . Ascorbic Acid (VITAMIN C PO) Take by mouth.  . B Complex Vitamins (VITAMIN B COMPLEX PO) Take by mouth.  . cholecalciferol (VITAMIN D) 1000 units tablet Take 1,000 Units by mouth daily.  . clomiPHENE (CLOMID) 50 MG tablet Take 50 mg by mouth daily.  Marland Kitchen doxycycline (PERIOSTAT) 20 MG tablet Take 1 tablet by mouth 2 (two) times daily.  Marland Kitchen FOLIC ACID PO Take by mouth.  Marland Kitchen glucosamine-chondroitin 500-400 MG tablet Take 1 tablet by mouth 3 (three) times daily.  Marland Kitchen LYSINE PO Take by mouth daily.  . Multiple Vitamin (MULTIVITAMIN) tablet Take 1 tablet by mouth daily.  . Omega-3 Fatty Acids (FISH OIL PO) Take by mouth.   No facility-administered encounter medications on file as of 10/22/2015.     PAST MEDICAL HISTORY:   Past Medical  History:  Diagnosis Date  . Anxiety   . Bleeding hemorrhoid 05/24/2010  . Depression 08/11/2006   Anxiety as well around down market- tough time with job loss in [employer]    . Hyperlipidemia LDL goal < 130 05/24/2010  . Irritable bowel syndrome 08/29/2007   Improved with diet change      PAST SURGICAL HISTORY:   Past Surgical History:  Procedure Laterality Date  . APPENDECTOMY    . COLONOSCOPY  05-01-00  . HERNIA REPAIR    . ROOT CANAL    . TONSILLECTOMY  1958    SOCIAL HISTORY:   Social History   Social History  . Marital status: Single    Spouse name: N/A  . Number of children: N/A  . Years of education: N/A   Occupational History  . Public relations account executive     works at [employer]   Social History Main Topics  . Smoking status: Never Smoker  . Smokeless tobacco: Not on file  . Alcohol use 0.0 oz/week     Comment: once a week  . Drug use: No  . Sexual activity: Not on file   Other Topics Concern  . Not on file   Social History Narrative   Lives alone. Long term partner - does not live with him.       Works at the [employer]- [employer]    Formerly Designer, multimedia.    Goes to [employer]      Hobbies: Exercises regularly, relax with friends    FAMILY HISTORY:   Family Status  Relation Status  . Brother Alive   HIV controlled  . Brother Alive   HIV controlled  . Sister Alive   healthy  . Mother Alive   Breast cancer  . Father Deceased   25, prostate cancer 10 years prior to death     ROS:  A complete 10 system review of systems was obtained and was unremarkable apart from what is mentioned above.  PHYSICAL EXAMINATION:    Pt placed into examining shorts for examination  VITALS:   Vitals:   10/22/15 0849  BP: 132/80  Pulse: 60  Weight: 201 lb (91.2 kg)  Height: 6\' 3"  (1.905 m)   Wt Readings from Last 3 Encounters:  10/22/15 201 lb (91.2 kg)  07/16/15 205 lb (93 kg)  04/23/15 206 lb  (93.4 kg)     GEN:  The patient appears stated age and is in NAD. HEENT:  Normocephalic, atraumatic.  The mucous membranes are moist. The superficial temporal arteries are  without ropiness or tenderness. CV:  RRR Lungs:  CTAB Neck/HEME:  There are no carotid bruits bilaterally.  Neurological examination:  Orientation: The patient is alert and oriented x3.  Cranial nerves: There is good facial symmetry.  Extraocular muscles are intact. The visual fields are full to confrontational testing. The speech is fluent and clear. Soft palate rises symmetrically and there is no tongue deviation. Hearing is intact to conversational tone. Sensation: Sensation is intact to light touch throughout Motor: Strength is 5/5 in the bilateral upper and lower extremities.   Shoulder shrug is equal and symmetric.  There is no pronator drift.  No foot drop noted   Movement examination: Tone: There is mild increased tone in the LUE and mild to mod increased tone in the LUE Abnormal movements: There is L arm tremor Coordination:  There is  decremation with RAM's, with heel taps/toe taps on the L Gait and Station: The patient has no difficulty arising out of a deep-seated chair without the use of the hands. The patient's stride length is normal with normal arm swing but there is the emergence of a resting tremor on the L with ambulation. The patient has a negative pull test.      ASSESSMENT/PLAN:  1.  Parkinsonism  -I do think that he meets the criteria for idiopathic parkinsons disease now.  We discussed this today in detail.  Discussed mirapex again today.   Discussed extensively r/b/se, including compulsive behaviors.  He and I discussed this and he will be seen in 3 month and wants to hold the medication.  -He is working with a Clinical research associate m/w/f and I congratulated him on all the exercise he is doing.  Encouraged to increase the CV exercise 2.  F/u in 3-4 months.  Much greater than 50% of this visit was spent in  counseling and coordinating care.  Total face to face time:  25 months

## 2015-10-22 ENCOUNTER — Ambulatory Visit (INDEPENDENT_AMBULATORY_CARE_PROVIDER_SITE_OTHER): Payer: BLUE CROSS/BLUE SHIELD | Admitting: Neurology

## 2015-10-22 ENCOUNTER — Encounter: Payer: Self-pay | Admitting: Neurology

## 2015-10-22 ENCOUNTER — Telehealth: Payer: Self-pay | Admitting: Neurology

## 2015-10-22 VITALS — BP 132/80 | HR 60 | Ht 75.0 in | Wt 201.0 lb

## 2015-10-22 DIAGNOSIS — G20 Parkinson's disease: Secondary | ICD-10-CM | POA: Diagnosis not present

## 2015-10-22 DIAGNOSIS — L719 Rosacea, unspecified: Secondary | ICD-10-CM | POA: Diagnosis not present

## 2015-10-22 NOTE — Telephone Encounter (Signed)
Patient wants to know the name of the medication Dr Tat talked to him about today in his appointment please call [phone]

## 2015-10-23 NOTE — Telephone Encounter (Signed)
Patient made aware they discussed Mirapex. He will call with any other questions.

## 2015-10-29 ENCOUNTER — Other Ambulatory Visit (INDEPENDENT_AMBULATORY_CARE_PROVIDER_SITE_OTHER): Payer: BLUE CROSS/BLUE SHIELD

## 2015-10-29 DIAGNOSIS — Z0000 Encounter for general adult medical examination without abnormal findings: Secondary | ICD-10-CM | POA: Diagnosis not present

## 2015-10-29 DIAGNOSIS — R972 Elevated prostate specific antigen [PSA]: Secondary | ICD-10-CM | POA: Diagnosis not present

## 2015-10-29 DIAGNOSIS — E291 Testicular hypofunction: Secondary | ICD-10-CM | POA: Diagnosis not present

## 2015-10-29 LAB — CBC WITH DIFFERENTIAL/PLATELET
BASOS PCT: 0.4 % (ref 0.0–3.0)
Basophils Absolute: 0 10*3/uL (ref 0.0–0.1)
EOS PCT: 1.1 % (ref 0.0–5.0)
Eosinophils Absolute: 0.1 10*3/uL (ref 0.0–0.7)
HEMATOCRIT: 43.6 % (ref 39.0–52.0)
Hemoglobin: 14.8 g/dL (ref 13.0–17.0)
LYMPHS PCT: 30.6 % (ref 12.0–46.0)
Lymphs Abs: 2.1 10*3/uL (ref 0.7–4.0)
MCHC: 34 g/dL (ref 30.0–36.0)
MCV: 92.1 fl (ref 78.0–100.0)
MONOS PCT: 9.1 % (ref 3.0–12.0)
Monocytes Absolute: 0.6 10*3/uL (ref 0.1–1.0)
NEUTROS ABS: 4 10*3/uL (ref 1.4–7.7)
Neutrophils Relative %: 58.8 % (ref 43.0–77.0)
PLATELETS: 187 10*3/uL (ref 150.0–400.0)
RBC: 4.73 Mil/uL (ref 4.22–5.81)
RDW: 14.2 % (ref 11.5–15.5)
WBC: 6.8 10*3/uL (ref 4.0–10.5)

## 2015-10-29 LAB — BASIC METABOLIC PANEL
BUN: 17 mg/dL (ref 6–23)
CHLORIDE: 105 meq/L (ref 96–112)
CO2: 29 meq/L (ref 19–32)
Calcium: 9.5 mg/dL (ref 8.4–10.5)
Creatinine, Ser: 0.93 mg/dL (ref 0.40–1.50)
GFR: 88.05 mL/min (ref >60.00)
Glucose, Bld: 104 mg/dL — ABNORMAL HIGH (ref 70–99)
POTASSIUM: 4.1 meq/L (ref 3.5–5.1)
SODIUM: 140 meq/L (ref 135–145)

## 2015-10-29 LAB — TSH: TSH: 1.16 u[IU]/mL (ref 0.35–4.50)

## 2015-10-29 LAB — LIPID PANEL
Cholesterol: 161 mg/dL (ref 0–200)
HDL: 47.6 mg/dL (ref >39.00)
LDL Cholesterol: 96 mg/dL (ref 0–99)
NONHDL: 113.83
TRIGLYCERIDES: 87 mg/dL (ref 0.0–149.0)
Total CHOL/HDL Ratio: 3
VLDL: 17.4 mg/dL (ref 0.0–40.0)

## 2015-10-29 LAB — HEPATIC FUNCTION PANEL
ALBUMIN: 4.4 g/dL (ref 3.5–5.2)
ALK PHOS: 33 U/L — AB (ref 39–117)
ALT: 15 U/L (ref 0–53)
AST: 20 U/L (ref 0–37)
Bilirubin, Direct: 0.1 mg/dL (ref 0.0–0.3)
TOTAL PROTEIN: 6.8 g/dL (ref 6.0–8.3)
Total Bilirubin: 0.7 mg/dL (ref 0.2–1.2)

## 2015-10-29 LAB — PSA: PSA: 6.05 ng/mL — AB (ref 0.10–4.00)

## 2015-11-05 ENCOUNTER — Encounter: Payer: Self-pay | Admitting: Family Medicine

## 2015-11-05 ENCOUNTER — Ambulatory Visit: Payer: BLUE CROSS/BLUE SHIELD | Admitting: Neurology

## 2015-11-05 ENCOUNTER — Ambulatory Visit (INDEPENDENT_AMBULATORY_CARE_PROVIDER_SITE_OTHER): Payer: BLUE CROSS/BLUE SHIELD | Admitting: Family Medicine

## 2015-11-05 VITALS — BP 128/84 | HR 78 | Temp 97.7°F | Ht 74.0 in | Wt 203.0 lb

## 2015-11-05 DIAGNOSIS — G20 Parkinson's disease: Secondary | ICD-10-CM

## 2015-11-05 DIAGNOSIS — F411 Generalized anxiety disorder: Secondary | ICD-10-CM

## 2015-11-05 DIAGNOSIS — M21621 Bunionette of right foot: Secondary | ICD-10-CM | POA: Diagnosis not present

## 2015-11-05 DIAGNOSIS — Z0001 Encounter for general adult medical examination with abnormal findings: Secondary | ICD-10-CM | POA: Diagnosis not present

## 2015-11-05 DIAGNOSIS — E785 Hyperlipidemia, unspecified: Secondary | ICD-10-CM | POA: Diagnosis not present

## 2015-11-05 DIAGNOSIS — G20A1 Parkinson's disease without dyskinesia, without mention of fluctuations: Secondary | ICD-10-CM

## 2015-11-05 DIAGNOSIS — M722 Plantar fascial fibromatosis: Secondary | ICD-10-CM | POA: Diagnosis not present

## 2015-11-05 DIAGNOSIS — E349 Endocrine disorder, unspecified: Secondary | ICD-10-CM

## 2015-11-05 DIAGNOSIS — Z23 Encounter for immunization: Secondary | ICD-10-CM | POA: Diagnosis not present

## 2015-11-05 MED ORDER — SERTRALINE HCL 50 MG PO TABS: 50.0000 mg | ORAL_TABLET | Freq: Every day | ORAL | 3 refills | Status: DC

## 2015-11-05 NOTE — Progress Notes (Signed)
Pre visit review using our clinic review tool, if applicable. No additional management support is needed unless otherwise documented below in the visit note. 

## 2015-11-05 NOTE — Patient Instructions (Addendum)
Call your insurace about the shingles shot to see if it is covered or how much it would cost and where is cheaper (here or pharmacy).  If you want to receive here, call for nurse visit. If at pharmacy- I can send in prescription.   Start zoloft 50mg  and follow up 4 weeks  Good luck with your decision on insurance, the hacking issues at work,

## 2015-11-05 NOTE — Assessment & Plan Note (Signed)
S: Anxiety- tension at workplace- computers have been hacked, caring for mother, parkinsons- anxiety has increased with all of those issues. Low dose zoloft advised- we did discuss potential aggrivation of parkinsons symptoms and will message Dr. Carles Collet. Tried a low dose klonopin from brother (advised against) and really helped. Anxiety previously mentioned 2008 A/P: We discussed trial low dose zoloft 50mg  and follow up in 4 weeks. Will also message Dr. Carles Collet given potential aggravation of motor symptoms- if she would prefer me use benzodiazepine- I am willing to change treatment

## 2015-11-05 NOTE — Progress Notes (Signed)
Phone: [phone]  Subjective:  Patient presents today for their annual physical. Chief complaint-noted.   See problem oriented charting- ROS- full  review of systems was completed and negative except for: tremor in left arm seems to be getting worse, some fatigue but overall doing well  The following were reviewed and entered/updated in epic: Past Medical History:  Diagnosis Date  . Anxiety   . Bleeding hemorrhoid 05/24/2010  . Depression 08/11/2006   Anxiety as well around down market- tough time with job loss in [employer]    . Hyperlipidemia LDL goal < 130 05/24/2010  . Irritable bowel syndrome 08/29/2007   Improved with diet change     Patient Active Problem List   Diagnosis Date Noted  . Parkinsonism ([place]) 10/30/2014    Priority: High  . Hyperlipidemia 05/24/2010    Priority: Medium  . Anxiety state 08/11/2006    Priority: Medium  . Bleeding hemorrhoid 05/24/2010    Priority: Low  . Irritable bowel syndrome 08/29/2007    Priority: Low  . Low serum vitamin D 04/16/2015  . Elevated PSA 10/30/2014   Past Surgical History:  Procedure Laterality Date  . APPENDECTOMY    . COLONOSCOPY  05-01-00  . HERNIA REPAIR    . ROOT CANAL    . TONSILLECTOMY  1958    Family History  Problem Relation Age of Onset  . Hypertension Mother   . Breast cancer Mother   . Prostate cancer Father     complications from radioactive seeds, colostomy for last 10 years    Medications- reviewed and updated Current Outpatient Prescriptions  Medication Sig Dispense Refill  . Ascorbic Acid (VITAMIN C PO) Take by mouth.    . B Complex Vitamins (VITAMIN B COMPLEX PO) Take by mouth.    . cholecalciferol (VITAMIN D) 1000 units tablet Take 1,000 Units by mouth daily.    . clomiPHENE (CLOMID) 50 MG tablet Take 50 mg by mouth daily.  3  . doxycycline (PERIOSTAT) 20 MG tablet Take 1 tablet by mouth 2 (two) times daily.  3  . FOLIC ACID PO Take by mouth.    Marland Kitchen glucosamine-chondroitin 500-400 MG  tablet Take 1 tablet by mouth 3 (three) times daily.    Marland Kitchen LYSINE PO Take by mouth daily.    . Multiple Vitamin (MULTIVITAMIN) tablet Take 1 tablet by mouth daily.    . Omega-3 Fatty Acids (FISH OIL PO) Take by mouth.    . sertraline (ZOLOFT) 50 MG tablet Take 1 tablet (50 mg total) by mouth daily. 30 tablet 3   No current facility-administered medications for this visit.     Allergies-reviewed and updated No Known Allergies  Social History   Social History  . Marital status: Single    Spouse name: N/A  . Number of children: N/A  . Years of education: N/A   Occupational History  . Public relations account executive     works at [employer]   Social History Main Topics  . Smoking status: Never Smoker  . Smokeless tobacco: None  . Alcohol use 0.0 oz/week     Comment: once a week  . Drug use: No  . Sexual activity: Not Asked   Other Topics Concern  . None   Social History Narrative   Lives alone. Long term partner - does not live with him.       Works at the [employer]    Formerly Designer, multimedia.    Goes to [employer]      Hobbies: Exercises regularly, relax with friends    Objective: BP 128/84 (BP Location: Left Arm, Patient Position: Sitting, Cuff Size: Large)   Pulse 78   Temp 97.7 F (36.5 C) (Oral)   Ht 6\' 2"  (1.88 m)   Wt 203 lb (92.1 kg)   SpO2 97%   BMI 26.06 kg/m  Gen: NAD, resting comfortably HEENT: Mucous membranes are moist. Oropharynx normal Neck: no thyromegaly CV: RRR no murmurs rubs or gallops Lungs: CTAB no crackles, wheeze, rhonchi Abdomen: soft/nontender/nondistended/normal bowel sounds. No rebound or guarding. Scar below umbilicus  Ext: no edema Skin: warm, dry Neuro: left arm tremor resting noted, slow shuffling gait.   Rectal done at urology- declines  Assessment/Plan:  60 y.o. male presenting for annual physical.  Health Maintenance counseling: 1. Anticipatory guidance: Patient  counseled regarding regular dental exams, eye exams, wearing seatbelts.  2. Risk factor reduction:  Advised patient of need for regular exercise and diet rich and fruits and vegetables to reduce risk of heart attack and stroke. Doing great with regular exercise 3. Immunizations/screenings/ancillary studies Immunization History  Administered Date(s) Administered  . Influenza Whole 01/03/2002  . Influenza-Unspecified 09/21/2014, 10/08/2015  . Td 10/03/1996, 11/05/2007   Health Maintenance Due  Topic Date Due  . ZOSTAVAX - check with insurance 09/13/2015   4. Prostate cancer screening- elevated PSA likely due to BPH. Saw urology 07/2015 and again last week- they did PSA- we will await their report.  Lab Results  Component Value Date   PSA 6.05 (H) 10/29/2015   PSA 3.47 10/30/2014   PSA 5.01 (H) 09/04/2014   5. Colon cancer screening - 09/24/2007 with 10 year repeat 6. Skin cancer screening- Dr. Renda Rolls yearly, just saw October. On tetracycline through her.   Status of chronic or acute concerns   Clomid through urology for hypogonadism- they are monitoring elevated PSA  IBS- no issues recently  Low vitamin D- in April did 8 week course of 50k vitamin D. Taking vit D in [place]. Check with next labs- prior was only as low as 27  Hyperglycemia- monitor but 104 this year and previously was controlled- will recheck next year.   Parkinsonism [facility]) S: still working with trainer MWF. Has opted out of medicine for now. Following about every 4 months with Dr. Carles Collet. Has seen neurology for left foot drop and conctracture of left upper arm A/P: continue regular neurology follow up  Anxiety state S: Anxiety- tension at workplace- computers have been hacked, caring for mother, parkinsons- anxiety has increased with all of those issues. Low dose zoloft advised- we did discuss potential aggrivation of parkinsons symptoms and will message Dr. Carles Collet. Tried a low dose klonopin from brother (advised against)  and really helped. Anxiety previously mentioned 2008 A/P: We discussed trial low dose zoloft 50mg  and follow up in 4 weeks. Will also message Dr. Carles Collet given potential aggravation of motor symptoms- if she would prefer me use benzodiazepine- I am willing to change treatment  Hyperlipidemia Resolved 2017 with regular exercise from parkinsons Lab Results  Component Value Date   CHOL 161 10/29/2015   HDL 47.60 10/29/2015   LDLCALC 96 10/29/2015   TRIG 87.0 10/29/2015   CHOLHDL 3 10/29/2015    4 week follow up  Meds ordered this encounter  Medications  . sertraline (ZOLOFT) 50 MG tablet    Sig: Take 1 tablet (50 mg total) by mouth daily.    Dispense:  30 tablet    Refill:  3    Return precautions advised.   [name], MD

## 2015-11-05 NOTE — Assessment & Plan Note (Signed)
Resolved 2017 with regular exercise from parkinsons Lab Results  Component Value Date   CHOL 161 10/29/2015   HDL 47.60 10/29/2015   LDLCALC 96 10/29/2015   TRIG 87.0 10/29/2015   CHOLHDL 3 10/29/2015

## 2015-11-05 NOTE — Assessment & Plan Note (Signed)
S: still working with trainer MWF. Has opted out of medicine for now. Following about every 4 months with Dr. Carles Collet. Has seen neurology for left foot drop and conctracture of left upper arm A/P: continue regular neurology follow up

## 2015-12-03 ENCOUNTER — Ambulatory Visit: Payer: BLUE CROSS/BLUE SHIELD | Admitting: Family Medicine

## 2016-01-12 NOTE — Progress Notes (Signed)
Dan [name] was seen today in the movement disorders clinic for neurologic consultation at the request of [name], MD.   The patient presents today for neurologic consultation for left foot drop.  The patient's symptoms started about one year ago when the patient noticed that he began to drag his left leg with walking, especially at work.  He states that the leg feels heavy and he has to remind himself to pick the foot up.  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  His friends noted tremor about a year ago, but he didn't notice it until about 6 months ago when he went to pick up a coffee cup and spilled some coffee.  His partner noted that he could no longer keep up with him when they would take walks.  04/23/15 update:  The patient is here today for six-month follow-up.  I have reviewed records made available to me.  He has been working out with a trainer Monday, Wednesday, and Friday at [facility]).  He has noted that the L arm wants to "pull inward."  He has recently been noticing more fatigue.  He had some labwork in that regard.  His TSH was normal.  His B12 was normal at 427.  His vitamin D was slightly low and he was recently started on supplementation.  These labs were done on 04/10/2015.  He has not had any falls since last visit.  No lightheadedness or near syncope.  No hallucinations.  He continues to work at [employer] on [employer] but he puts in 12,000 steps a day.  He notes L arm tremor and feels an inner tremor in the L but no outer tremor.  Sometimes L foot will catch on the ground.    07/16/15 update:  The patient presents today for follow-up.  Last visit, the patient was formally diagnosed with idiopathic Parkinson's disease.  I felt that he would benefit from a dopamine agonist, but he wanted to hold on that.   Since our last visit, the patient feels that he has overall been doing well.  He has not had any falls.  No hallucinations.  No lightheadedness or near syncope.  He is going to the [facility] 3 days a week.  Dx with Vit D deficiency and started on replacement and that helped with fatigue.  Saw dermatology (Dr. Renda Rolls) today and had skin examination  10/22/15 update:  The patient follows up today.  He has a history of Parkinson's disease.  He has wanted no medication for this.  He has noted increased tremor in the L arm.  He is exercising at the [facility] 3 days a week. He does state that he has had plantar fasciitis in the L foot.  He saw podiatry and had a cortisone injection and it is helping.  He has seen his PCP.  He has had labs and his B12, testosterone, vit D is normal.   He has not had any falls.  No hallucinations.  No lightheadedness or near syncope.  Mood is improved.  He attributes this to the fact that his mother has been on list for friends home [place] and she is moving in first week of November.    01/14/16 update:  Patient follows up for his history  of Parkinson's disease.  He is on no medication, which is by his choice.  He continues to exercise 3 days per week at the [facility] and states that he works on core and balance.  Still working full time.  Pt denies falls.  Pt denies lightheadedness unless really working out hard at the gym and no near syncope.  No hallucinations.  No longer having RBD.    Does not that L hand shakes most of the time but not interfering with ADLS's The records that were made available to me were reviewed.  Dr. Yong Channel started patient on zoloft in November and appreciate talking with him about that.  Pt states that after talking with Dr. Yong Channel about the feelings, they resolved and he feels much better.  PREVIOUS MEDICATIONS: none to date  ALLERGIES:  No Known Allergies  CURRENT MEDICATIONS:  Outpatient Encounter Prescriptions as of 01/14/2016  Medication Sig  .  Ascorbic Acid (VITAMIN C PO) Take by mouth.  . B Complex Vitamins (VITAMIN B COMPLEX PO) Take by mouth.  . cholecalciferol (VITAMIN D) 1000 units tablet Take 1,000 Units by mouth daily.  . clomiPHENE (CLOMID) 50 MG tablet Take 50 mg by mouth daily.  Marland Kitchen doxycycline (PERIOSTAT) 20 MG tablet Take 1 tablet by mouth 2 (two) times daily.  Marland Kitchen FOLIC ACID PO Take by mouth.  Marland Kitchen glucosamine-chondroitin 500-400 MG tablet Take 1 tablet by mouth 3 (three) times daily.  Marland Kitchen LYSINE PO Take by mouth daily.  . Multiple Vitamin (MULTIVITAMIN) tablet Take 1 tablet by mouth daily.  . Omega-3 Fatty Acids (FISH OIL PO) Take by mouth.  . sertraline (ZOLOFT) 50 MG tablet Take 1 tablet (50 mg total) by mouth daily.   No facility-administered encounter medications on file as of 01/14/2016.     PAST MEDICAL HISTORY:   Past Medical History:  Diagnosis Date  . Anxiety   . Bleeding hemorrhoid 05/24/2010  . Depression 08/11/2006   Anxiety as well around down market- tough time with job loss in [employer]    . Hyperlipidemia LDL goal < 130 05/24/2010  . Irritable bowel syndrome 08/29/2007   Improved with diet change      PAST SURGICAL HISTORY:   Past Surgical History:  Procedure Laterality Date  . APPENDECTOMY    . COLONOSCOPY  05-01-00  . HERNIA REPAIR    . ROOT CANAL    . TONSILLECTOMY  1958    SOCIAL HISTORY:   Social History   Social History  . Marital status: Single    Spouse name: N/A  . Number of children: N/A  . Years of education: N/A   Occupational History  . Public relations account executive     works at [employer]   Social History Main Topics  . Smoking status: Never Smoker  . Smokeless tobacco: Not on file  . Alcohol use 0.0 oz/week     Comment: once a week  . Drug use: No  . Sexual activity: Not on file   Other Topics Concern  . Not on file   Social History Narrative   Lives alone. Long term partner - does not live with him.       Works at the [employer]     Formerly Designer, multimedia.    Goes to [employer]      Hobbies: Exercises regularly, relax with friends    FAMILY HISTORY:   Family Status  Relation Status  . Brother Alive   HIV  controlled  . Brother Alive   HIV controlled  . Sister Alive   healthy  . Mother Alive   Breast cancer  . Father Deceased   40, prostate cancer 10 years prior to death     ROS:  A complete 10 system review of systems was obtained and was unremarkable apart from what is mentioned above.  PHYSICAL EXAMINATION:    VITALS:   Vitals:   01/14/16 0836  BP: 120/80  Pulse: 80  Weight: 201 lb (91.2 kg)  Height: 6\' 3"  (1.905 m)   Wt Readings from Last 3 Encounters:  01/14/16 201 lb (91.2 kg)  11/05/15 203 lb (92.1 kg)  10/22/15 201 lb (91.2 kg)     GEN:  The patient appears stated age and is in NAD. HEENT:  Normocephalic, atraumatic.  The mucous membranes are moist. The superficial temporal arteries are without ropiness or tenderness. CV:  RRR Lungs:  CTAB Neck/HEME:  There are no carotid bruits bilaterally.  Neurological examination:  Orientation: The patient is alert and oriented x3.  Cranial nerves: There is good facial symmetry.   Soft palate rises symmetrically and there is no tongue deviation. Hearing is intact to conversational tone. Sensation: Sensation is intact to light touch throughout Motor: Strength is 5/5 in the bilateral upper and lower extremities.   Shoulder shrug is equal and symmetric.  There is no pronator drift.  No foot drop noted   Movement examination: Tone: There is mild increased tone in the LUE and mild to mod increased tone in the LUE Abnormal movements: There is L arm tremor that is near constant Coordination:  There is decremation, with all form of RAMS, including alternating supination and pronation of the forearm, hand opening and closing, finger taps, heel taps and toe taps on the L only.  RAMs on the right are good. Gait and Station:  The patient has no difficulty arising out of a deep-seated chair without the use of the hands. The patient's stride length is normal with decreased arm swing on the L but there is the emergence of a resting tremor on the L with ambulation. The patient has a negative pull test.      ASSESSMENT/PLAN:  1.  Idiopathic Parkinsons disease  - We discussed this today in detail.  I would advise that he start medication.   Discussed mirapex again today.   Discussed extensively r/b/se, including compulsive behaviors.  He stated that he wanted to do some research and would call me back about that.  I told him that his L leg was getting rigid and worry it increases risk of falls.  -He is working with a trainer m/w/f and I congratulated him on all the exercise he is doing.  Encouraged to increase the CV exercise 2.  F/u in 3-4 months.  Much greater than 50% of this visit was spent in counseling and coordinating care.  Total face to face time:  30  min

## 2016-01-14 ENCOUNTER — Encounter: Payer: Self-pay | Admitting: Family Medicine

## 2016-01-14 ENCOUNTER — Encounter: Payer: Self-pay | Admitting: Neurology

## 2016-01-14 ENCOUNTER — Ambulatory Visit (INDEPENDENT_AMBULATORY_CARE_PROVIDER_SITE_OTHER): Payer: BLUE CROSS/BLUE SHIELD | Admitting: Neurology

## 2016-01-14 ENCOUNTER — Ambulatory Visit (INDEPENDENT_AMBULATORY_CARE_PROVIDER_SITE_OTHER): Payer: BLUE CROSS/BLUE SHIELD | Admitting: Family Medicine

## 2016-01-14 VITALS — BP 120/80 | HR 80 | Ht 75.0 in | Wt 201.0 lb

## 2016-01-14 VITALS — BP 136/82 | HR 64 | Temp 98.0°F | Wt 201.0 lb

## 2016-01-14 DIAGNOSIS — G20 Parkinson's disease: Secondary | ICD-10-CM | POA: Diagnosis not present

## 2016-01-14 DIAGNOSIS — F411 Generalized anxiety disorder: Secondary | ICD-10-CM

## 2016-01-14 NOTE — Progress Notes (Signed)
Subjective:  Dan [name] is a 61 y.o. year old very pleasant male patient who presents for/with See problem oriented charting ROS- no falls. Rigidity on left side noted. No chest pain or shortness of breath. Anxiety levels much improved   Past Medical History-  Patient Active Problem List   Diagnosis Date Noted  . Hypotestosteronemia 11/05/2015    Priority: High  . Elevated PSA 10/30/2014    Priority: High  . Parkinsonism ([place]) 10/30/2014    Priority: High  . Low serum vitamin D 04/16/2015    Priority: Medium  . Hyperlipidemia 05/24/2010    Priority: Medium  . Anxiety state 08/11/2006    Priority: Medium  . Bleeding hemorrhoid 05/24/2010    Priority: Low  . Irritable bowel syndrome 08/29/2007    Priority: Low    Medications- reviewed and updated Current Outpatient Prescriptions  Medication Sig Dispense Refill  . Ascorbic Acid (VITAMIN C PO) Take by mouth.    . B Complex Vitamins (VITAMIN B COMPLEX PO) Take by mouth.    . cholecalciferol (VITAMIN D) 1000 units tablet Take 1,000 Units by mouth daily.    . clomiPHENE (CLOMID) 50 MG tablet Take 50 mg by mouth daily.  3  . doxycycline (PERIOSTAT) 20 MG tablet Take 1 tablet by mouth 2 (two) times daily.  3  . FOLIC ACID PO Take by mouth.    Marland Kitchen glucosamine-chondroitin 500-400 MG tablet Take 1 tablet by mouth 3 (three) times daily.    Marland Kitchen LYSINE PO Take by mouth daily.    . Multiple Vitamin (MULTIVITAMIN) tablet Take 1 tablet by mouth daily.    . Omega-3 Fatty Acids (FISH OIL PO) Take by mouth.     No current facility-administered medications for this visit.     Objective: BP 136/82 (BP Location: Left Arm, Patient Position: Sitting, Cuff Size: Normal)   Pulse 64   Temp 98 F (36.7 C) (Oral)   Wt 201 lb (91.2 kg)   SpO2 97%   BMI 25.12 kg/m  Gen: NAD, resting comfortably CV: RRR no murmurs rubs or gallops Lungs: CTAB no crackles, wheeze, rhonchi.  Ext: no edema Skin: warm, dry Neuro: tremor left hand at  rest  Assessment/Plan:  Anxiety state S: Patient has had tension at workplace and a lot of stress with cpus being hacked, caring for mom as well as parkinsons. We opted for low dose zoloft 50mg  in November vusut. Dr. Carles Collet thought zoloft reasonable choice from him through messaging (his neurologist)  Today, he states he transitioned mom to friends home [place]. Anxiety/stress drastically decreased and not taking zoloft.  A/P: extended counseling about stressors in his life and improvements made. At this point he doe snot want to pursue the zoloft. He will let me know if this changes  Parkinsonism [facility]) S: Patient very confident in Dr. Doristine Devoid care but he has also heard from some friends and other people with parkinsons it can be valuable to get another opinion.  A/P: I discussed with patient that he is in very capable hands with Dr. Carles Collet who is a movement disorder specialist. Despite this patient thinks he will get peace of mind from another opinion. Will refer when patient reaches out to me with name he has at home.    Return in about 6 months (around 07/13/2016) for follow up- or sooner if needed.  The duration of face-to-face time during this visit was greater than 30 minutes. Greater than 50% of this time was spent in counseling about his  anxiety/stressors and about getting second opinion from neurologist.   Return precautions advised.  [name], MD

## 2016-01-14 NOTE — Patient Instructions (Signed)
1.  You can research the pramipexole (mirapex) further and let me know what you decide.  I would recommend that you start on it.  If you decide to start on it, then let me know if you want me to try to get the once per day version through the insurance company

## 2016-01-14 NOTE — Patient Instructions (Addendum)
Glad you are doing so well!   Glad you didn't need the zoloft in the end and your stress level is much improved. I removed from your list  Dan [name] will sign you up for mychart  Send me a message and I will refer you for the 2nd opinion as you requested

## 2016-01-14 NOTE — Assessment & Plan Note (Signed)
S: Patient has had tension at workplace and a lot of stress with cpus being hacked, caring for mom as well as parkinsons. We opted for low dose zoloft 50mg  in November vusut. Dr. Carles Collet thought zoloft reasonable choice from him through messaging (his neurologist)  Today, he states he transitioned mom to friends home [place]. Anxiety/stress drastically decreased and not taking zoloft.  A/P: extended counseling about stressors in his life and improvements made. At this point he doe snot want to pursue the zoloft. He will let me know if this changes

## 2016-01-14 NOTE — Progress Notes (Signed)
Pre visit review using our clinic review tool, if applicable. No additional management support is needed unless otherwise documented below in the visit note. 

## 2016-01-14 NOTE — Assessment & Plan Note (Signed)
S: Patient very confident in Dr. Doristine Devoid care but he has also heard from some friends and other people with parkinsons it can be valuable to get another opinion.  A/P: I discussed with patient that he is in very capable hands with Dr. Carles Collet who is a movement disorder specialist. Despite this patient thinks he will get peace of mind from another opinion. Will refer when patient reaches out to me with name he has at home.

## 2016-01-15 ENCOUNTER — Other Ambulatory Visit: Payer: Self-pay

## 2016-01-15 DIAGNOSIS — G20 Parkinson's disease: Secondary | ICD-10-CM

## 2016-01-28 ENCOUNTER — Ambulatory Visit: Payer: BLUE CROSS/BLUE SHIELD | Admitting: Neurology

## 2016-02-18 ENCOUNTER — Ambulatory Visit (INDEPENDENT_AMBULATORY_CARE_PROVIDER_SITE_OTHER): Payer: BLUE CROSS/BLUE SHIELD | Admitting: Neurology

## 2016-02-18 ENCOUNTER — Encounter: Payer: Self-pay | Admitting: Neurology

## 2016-02-18 VITALS — BP 125/85 | HR 72 | Resp 20 | Ht 75.0 in | Wt 201.0 lb

## 2016-02-18 DIAGNOSIS — G20 Parkinson's disease: Secondary | ICD-10-CM

## 2016-02-18 NOTE — Patient Instructions (Signed)
I do agree you have Parkinson's disease, affecting your left side and findings are on the mild side. Nevertheless, as you know, this disease does progress with time. It can affect your balance, your memory, your mood, your bowel and bladder function, your posture, balance and walking and your activities of daily living. However, there are good supportive treatments and symptomatic treatments available, so most patients have a change to a good quality life and life expectancy is not typically altered. Overall you are doing fairly well but I do want to suggest a few things today:  Remember to drink plenty of fluid at least 6 glasses (8 oz each), eat healthy meals and do not skip any meals. Try to eat protein with a every meal and eat a healthy snack such as fruit or nuts in between meals. Try to keep a regular sleep-wake schedule and try to exercise daily, particularly in the form of walking, 20-30 minutes a day, if you can.   Consider starting medication: you can talk to Dr. Carles Collet about trying Mirapex, which is a dopamine agonist (or Requip or Neupro patch).  You may want to talk to her about trying Azilect (MAO-B inhibitor) or Artane (helps mostly with tremor).   Please remember, no medication is without potential side effects and not every medication is right for every patient with PD.   Try to stay active physically and mentally. Engage in social activities in your community and with your family and try to keep up with current events by reading the newspaper or watching the news. Try to do word puzzles and you may like to do puzzles and brain games on the computer such as on [email or web address]   You can consider taking coenzyme Q 10 over the counter: 400 mg 3 times a day.   I would like for you to see Dr. Carles Collet as scheduled.   For any emergencies you know to call 911 or go to the nearest emergency room.

## 2016-02-18 NOTE — Progress Notes (Signed)
Subjective:    Patient ID: Dan [name] is a 61 y.o. male.  HPI     Star Age, MD, PhD [facility] [address] [place], [place] [address]  Dear Dr. Yong Channel,   I saw your patient, Dan [name], upon your kind request in my neurologic clinic today for second opinion of his parkinsonism. The patient is unaccompanied today. As you know, Dan [name] is a very friendly 61 year old right-handed gentleman with an underlying medical history of hyperlipidemia, irritable bowel syndrome, anxiety, depression, status post hernia repair, tonsillectomy, appendectomy, who reports having issues with left foot dragging and left upper extremity tremor for the past 2 and half years. He has been followed by Dr. Carles Collet since October 2016. Most recent visit was on 01/14/2016 and I reviewed the office note. He was advised to consider starting treatment for left sided predominant Parkinson's disease in the form of Mirapex. His symptoms date back to about 2-1/2 years ago when he started having difficulty with walking and dragging his left foot, left arm was noted to have a tremor. Of note, he had a brain MRI with and without contrast on 09/24/2014 which I reviewed: IMPRESSION: No cause of the presenting symptoms is identified. Mild volume loss without evidence of previous stroke or other focal insult.  In addition, personally reviewed the images through the PACS system. He reports that over the past year, his left upper extremity tremor has progressed. He has not had any significant memory issues, has had issues with anxiety, thankfully no falls, is physically very active, works full-time at [employer]. Has had no significant issues at work but does feel very drained and exhausted by the end of the day. He lives alone, he has no children. He is a nonsmoker. He reports that he lost his father to half years ago secondary to cancer and had to take care of his mother. She  has since then moved into assisted living. He has siblings but they are in [place]. He is originally from [place]. He works out 3 times a week with a Clinical research associate. He otherwise enjoys painting. He was trained in fine arts. He has never been on medication for symptomatic treatment of his Parkinson's disease for fear of side effects. He has talked to Dr. Carles Collet about starting Mirapex but when he read the side effects of it he became too scared. He does not notice any right-sided symptoms. He has not started any medication for anxiety but does admit to having issues with anxiety. He has no significant depressive symptoms.  His Past Medical History Is Significant For: Past Medical History:  Diagnosis Date  . Anxiety   . Bleeding hemorrhoid 05/24/2010  . Depression 08/11/2006   Anxiety as well around down market- tough time with job loss in [employer]    . Hyperlipidemia LDL goal < 130 05/24/2010  . Irritable bowel syndrome 08/29/2007   Improved with diet change      His Past Surgical History Is Significant For: Past Surgical History:  Procedure Laterality Date  . APPENDECTOMY    . COLONOSCOPY  05-01-00  . HERNIA REPAIR    . ROOT CANAL    . TONSILLECTOMY  1958    His Family History Is Significant For: Family History  Problem Relation Age of Onset  . Hypertension Mother   . Breast cancer Mother   . Prostate cancer Father     complications from radioactive seeds, colostomy for last 10 years  His Social History Is Significant For: Social History   Social History  . Marital status: Single    Spouse name: N/A  . Number of children: N/A  . Years of education: N/A   Occupational History  . Public relations account executive     works at [employer]   Social History Main Topics  . Smoking status: Never Smoker  . Smokeless tobacco: Never Used  . Alcohol use 0.0 oz/week     Comment: once a week  . Drug use: No  . Sexual activity: Not Asked   Other Topics Concern  . None   Social History  Narrative   Lives alone. Long term partner - does not live with him.       Works at the [employer]    Formerly Designer, multimedia.    Goes to [employer]      Hobbies: Exercises regularly, relax with friends    His Allergies Are:  No Known Allergies:   His Current Medications Are:  Outpatient Encounter Prescriptions as of 02/18/2016  Medication Sig  . Ascorbic Acid (VITAMIN C PO) Take by mouth.  . Ascorbic Acid (VITAMIN C) 1000 MG tablet Take 1,000 mg by mouth daily.  . B Complex Vitamins (VITAMIN B COMPLEX PO) Take by mouth.  . cholecalciferol (VITAMIN D) 1000 units tablet Take 1,000 Units by mouth daily.  . clomiPHENE (CLOMID) 50 MG tablet Take 50 mg by mouth daily.  Marland Kitchen doxycycline (VIBRAMYCIN) 100 MG capsule Take 100 mg by mouth 2 (two) times daily.  Marland Kitchen FOLIC ACID PO Take [id number] mg by mouth.   Marland Kitchen glucosamine-chondroitin 500-400 MG tablet Take 1 tablet by mouth 3 (three) times daily.  Marland Kitchen L-Lysine 500 MG TABS Take 500 mg by mouth daily.  . Multiple Vitamin (MULTIVITAMIN) tablet Take 1 tablet by mouth daily.  . Omega-3 Fatty Acids (FISH OIL) 1000 MG CAPS Take 1,000 mg by mouth daily.  . saw palmetto 160 MG capsule Take 160 mg by mouth daily.  . [DISCONTINUED] doxycycline (PERIOSTAT) 20 MG tablet Take 1 tablet by mouth 2 (two) times daily.  . [DISCONTINUED] LYSINE PO Take 500 mg by mouth daily.   . [DISCONTINUED] Omega-3 Fatty Acids (FISH OIL PO) Take by mouth.   No facility-administered encounter medications on file as of 02/18/2016.   : Review of Systems:  Out of a complete 14 point review of systems, all are reviewed and negative with the exception of these symptoms as listed below:  Review of Systems  Neurological: Positive for tremors and weakness.       Pt presents today to discuss a second opinion for his PD. Pt sees Dr. Carles Collet. Pt has noticed that his tremor has increased over the past 2 years.    Objective:  Neurologic  Exam  Physical Exam Physical Examination:   Vitals:   02/18/16 1002  BP: 125/85  Pulse: 72  Resp: 20    General Examination: The patient is a very pleasant 61 y.o. male in no acute distress. He is mildly anxious appearing. Conversant, gives a very detailed history. Very well groomed, looks younger than stated age. Well-nourished and well-developed.  HEENT: Normocephalic, atraumatic, pupils are equal, round and reactive to light and accommodation. Extraocular tracking shows mild saccadic breakdown without nystagmus noted. There is no limitation to his gaze. There is mild decrease in eye blink rate. Hearing is intact. Tympanic membranes are clear bilaterally. Face is symmetric with mild facial masking and normal facial sensation.  There is no lip, intermittent slight neck and jaw tremor. Neck is mildly rigid with intact passive ROM. There are no carotid bruits on auscultation. Oropharynx exam reveals mild mouth dryness. No significant airway crowding is noted. Mallampati is class I. Tongue protrudes centrally and palate elevates symmetrically.   There is no drooling.   Chest: is clear to auscultation without wheezing, rhonchi or crackles noted.  Heart: sounds are regular and normal without murmurs, rubs or gallops noted.   Abdomen: is soft, non-tender and non-distended with normal bowel sounds appreciated on auscultation.  Extremities: There is no pitting edema in the distal lower extremities bilaterally. Pedal pulses are intact.   Skin: is warm and dry with no trophic changes noted.    Musculoskeletal: exam reveals no obvious joint deformities, tenderness, joint swelling or erythema.  Neurologically:  Mental status: The patient is awake and alert, paying good  attention. He is able to completely provide the history. He is oriented to: person, place, time/date, situation, day of week, month of year and year. His memory, attention, language and knowledge are intact. There is no aphasia,  agnosia, apraxia or anomia. There is a no bradyphrenia. Speech is mildly hypophonic with no dysarthria noted. Mood is congruent and affect is normal.   Cranial nerves are as described above under HEENT exam. In addition, shoulder shrug is normal but left shoulder seems to be slightly higher than right.  Motor exam: Normal bulk, and strength for age is noted. He has no dyskinesias. Tone is mildly increased on the left side, normal on the right. He has a mild to moderate resting tremor in the right upper extremity only, no other tremor. On fine motor skills testing he has mild to moderate difficulty with left finger taps, hand movements and foot taps and foot agility, on the right side he has no difficulty with hand movements but finger taps and foot taps are minimally impaired. He stands without difficulty, posture is fairly age-appropriate but appears to be very mildly stooped for age. He has decreased arm swing on the left, good arm swing, almost deliberate arm swing on the right, good turns, good pace and stride length, more pronounced tremor when walking on the left. Reflexes are symmetrical, sensory exam is intact to light touch and temperature and vibration. Cerebellar testing shows no intention tremor or dysmetria.   Assessment and Plan:    In summary, Dan [name] is a very pleasant 61 y.o.-year old male with an underlying medical history of hyperlipidemia, irritable bowel syndrome, anxiety, depression, status post hernia repair, tonsillectomy, appendectomy, who Presents for evaluation and second opinion of his parkinsonism. His history and physical exam are in keeping with left-sided predominant Parkinson's disease. I talked with the patient length today. He has not Tried any medication for this. We talked about different treatment options for symptomatic treatment of Parkinson's disease. He was advised to start a trial of dopamine agonist in the form of generic Mirapex and is encouraged to  consider this. Starting with a dopamine agonist for a new onset Parkinson's disease and a younger patient is not a bad choice. He has been fearful of side effects. I explained to him that there are no medications are without potential side effects, not every medication works well for everybody. He has noted progression of his symptoms. He is advised to continue to pursue healthy lifestyle, stay active physically, mentally, stay well hydrated, well rested and pursue exercise on a regular basis. He has been doing well in that  regard. Thankfully, he does not have much in the way of nonmotor symptoms at this time. Monotherapy with generic Artane to reduce primarily the tremor is a potential, he could also start Azilect as monotherapy, generic. Of course, treatment can also be initiated in the form of generic Sinemet. We talked a little bit about the side effect profile and mechanism of action of these different medications. Ultimately, he will have to make a decision whether or not he wants to start symptomatic medication. He is encouraged to pick up this discussion with his neurologist, Dr. Carles Collet.   I answered all his questions today and the patient was in agreement.  Thank you very much for allowing me to participate in the care of this nice patient. I appreciate the opportunity to weigh in. If I can be of any further assistance to you please do not hesitate to call me at [phone].  Sincerely,   Star Age, MD, PhD

## 2016-03-10 DIAGNOSIS — M79671 Pain in right foot: Secondary | ICD-10-CM | POA: Diagnosis not present

## 2016-03-10 DIAGNOSIS — M21621 Bunionette of right foot: Secondary | ICD-10-CM | POA: Diagnosis not present

## 2016-03-10 DIAGNOSIS — G5761 Lesion of plantar nerve, right lower limb: Secondary | ICD-10-CM | POA: Diagnosis not present

## 2016-03-10 DIAGNOSIS — R2689 Other abnormalities of gait and mobility: Secondary | ICD-10-CM | POA: Diagnosis not present

## 2016-03-23 DIAGNOSIS — L602 Onychogryphosis: Secondary | ICD-10-CM | POA: Diagnosis not present

## 2016-04-12 NOTE — Progress Notes (Signed)
Dan [name] was seen today in the movement disorders clinic for neurologic consultation at the request of [name], MD.   The patient presents today for neurologic consultation for left foot drop.  The patient's symptoms started about one year ago when the patient noticed that he began to drag his left leg with walking, especially at work.  He states that the leg feels heavy and he has to remind himself to pick the foot up.  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  His friends noted tremor about a year ago, but he didn't notice it until about 6 months ago when he went to pick up a coffee cup and spilled some coffee.  His partner noted that he could no longer keep up with him when they would take walks.  04/23/15 update:  The patient is here today for six-month follow-up.  I have reviewed records made available to me.  He has been working out with a trainer Monday, Wednesday, and Friday at [facility]).  He has noted that the L arm wants to "pull inward."  He has recently been noticing more fatigue.  He had some labwork in that regard.  His TSH was normal.  His B12 was normal at 427.  His vitamin D was slightly low and he was recently started on supplementation.  These labs were done on 04/10/2015.  He has not had any falls since last visit.  No lightheadedness or near syncope.  No hallucinations.  He continues to work at [employer] on [employer] but he puts in 12,000 steps a day.  He notes L arm tremor and feels an inner tremor in the L but no outer tremor.  Sometimes L foot will catch on the ground.    07/16/15 update:  The patient presents today for follow-up.  Last visit, the patient was formally diagnosed with idiopathic Parkinson's disease.  I felt that he would benefit from a dopamine agonist, but he wanted to hold on that.   Since our last visit, the patient feels that he has overall been doing well.  He has not had any falls.  No hallucinations.  No lightheadedness or near syncope.  He is going to the [facility] gym 3 days a week.  Dx with Vit D deficiency and started on replacement and that helped with fatigue.  Saw dermatology (Dr. Renda Rolls) today and had skin examination  10/22/15 update:  The patient follows up today.  He has a history of Parkinson's disease.  He has wanted no medication for this.  He has noted increased tremor in the L arm.  He is exercising at the [facility] 3 days a week. He does state that he has had plantar fasciitis in the L foot.  He saw podiatry and had a cortisone injection and it is helping.  He has seen his PCP.  He has had labs and his B12, testosterone, vit D is normal.   He has not had any falls.  No hallucinations.  No lightheadedness or near syncope.  Mood is improved.  He attributes this to the fact that his mother has been on list for friends home [place] and she is moving in first week of November.    01/14/16 update:  Patient follows up for his history  of Parkinson's disease.  He is on no medication, which is by his choice.  He continues to exercise 3 days per week at the [facility] and states that he works on core and balance.  Still working full time.  Pt denies falls.  Pt denies lightheadedness unless really working out hard at the gym and no near syncope.  No hallucinations.  No longer having RBD.    Does not that L hand shakes most of the time but not interfering with ADLS's The records that were made available to me were reviewed.  Dr. Yong Channel started patient on zoloft in November and appreciate talking with him about that.  Pt states that after talking with Dr. Yong Channel about the feelings, they resolved and he feels much better.  04/14/16 update:  Patient is seen today in follow-up.  Since our last visit, he did seek a second opinion with Dr. Rexene Alberts.  I have reviewed her records.  She agreed with the  diagnosis.  She agreed with the working plan.  The patient continues to exercise 3 times per week at [facility].  He changed working schedule so only working 4 days per week now.  He denies any falls.  More trouble getting in car with lifting left leg in the car.   He denies any lightheadedness or near syncope.  No hallucinations.  He continues to have left hand tremor and thinks that it has gotten worse.  ADL's are taking longer.  Taking nearly 2 hours to get ready in the AM.   No other new medical problems since our last visit.  Is ready to start medication.  Sleeping well.   PREVIOUS MEDICATIONS: none to date  ALLERGIES:  No Known Allergies  CURRENT MEDICATIONS:  Outpatient Encounter Prescriptions as of 04/14/2016  Medication Sig  . Ascorbic Acid (VITAMIN C PO) Take by mouth.  . Ascorbic Acid (VITAMIN C) 1000 MG tablet Take 1,000 mg by mouth daily.  . B Complex Vitamins (VITAMIN B COMPLEX PO) Take by mouth.  . cholecalciferol (VITAMIN D) 1000 units tablet Take 1,000 Units by mouth daily.  . clomiPHENE (CLOMID) 50 MG tablet Take 50 mg by mouth daily.  Marland Kitchen doxycycline (VIBRAMYCIN) 100 MG capsule Take 100 mg by mouth 2 (two) times daily.  Marland Kitchen FOLIC ACID PO Take 846 mg by mouth.   Marland Kitchen glucosamine-chondroitin 500-400 MG tablet Take 1 tablet by mouth 3 (three) times daily.  Marland Kitchen L-Lysine 500 MG TABS Take 500 mg by mouth daily.  . Multiple Vitamin (MULTIVITAMIN) tablet Take 1 tablet by mouth daily.  . Omega-3 Fatty Acids (FISH OIL) 1000 MG CAPS Take 1,000 mg by mouth daily.  . saw palmetto 160 MG capsule Take 160 mg by mouth daily.   No facility-administered encounter medications on file as of 04/14/2016.     PAST MEDICAL HISTORY:   Past Medical History:  Diagnosis Date  . Anxiety   . Bleeding hemorrhoid 05/24/2010  . Depression 08/11/2006   Anxiety as well around down market- tough time with job loss in [employer]    . Hyperlipidemia LDL goal < 130 05/24/2010  . Irritable bowel syndrome 08/29/2007    Improved with diet change      PAST SURGICAL HISTORY:   Past Surgical History:  Procedure Laterality Date  . APPENDECTOMY    . COLONOSCOPY  05-01-00  . HERNIA REPAIR    . ROOT CANAL    . TONSILLECTOMY  1958    SOCIAL HISTORY:   Social History  Social History  . Marital status: Single    Spouse name: N/A  . Number of children: N/A  . Years of education: N/A   Occupational History  . Public relations account executive     works at [employer]   Social History Main Topics  . Smoking status: Never Smoker  . Smokeless tobacco: Never Used  . Alcohol use 0.0 oz/week     Comment: once a week  . Drug use: No  . Sexual activity: Not on file   Other Topics Concern  . Not on file   Social History Narrative   Lives alone. Long term partner - does not live with him.       Works at the [employer]    Formerly Designer, multimedia.    Goes to [employer]      Hobbies: Exercises regularly, relax with friends    FAMILY HISTORY:   Family Status  Relation Status  . Brother Alive   HIV controlled  . Brother Alive   HIV controlled  . Sister Alive   healthy  . Mother Alive   Breast cancer  . Father Deceased   25, prostate cancer 10 years prior to death     ROS:  A complete 10 system review of systems was obtained and was unremarkable apart from what is mentioned above.  PHYSICAL EXAMINATION:    VITALS:   Vitals:   04/14/16 1456  BP: 122/72  Pulse: 84  SpO2: 96%  Weight: 205 lb (93 kg)  Height: 6' 2.5" (1.892 m)   Wt Readings from Last 3 Encounters:  04/14/16 205 lb (93 kg)  02/18/16 201 lb (91.2 kg)  01/14/16 201 lb (91.2 kg)     GEN:  The patient appears stated age and is in NAD. HEENT:  Normocephalic, atraumatic.  The mucous membranes are moist. The superficial temporal arteries are without ropiness or tenderness. CV:  RRR Lungs:  CTAB Neck/HEME:  There are no carotid bruits bilaterally.  Neurological  examination:  Orientation: The patient is alert and oriented x3.  Cranial nerves: There is good facial symmetry.   Soft palate rises symmetrically and there is no tongue deviation. Hearing is intact to conversational tone. Sensation: Sensation is intact to light touch throughout Motor: Strength is 5/5 in the bilateral upper and lower extremities.   Shoulder shrug is equal and symmetric.  There is no pronator drift.  No foot drop noted   Movement examination: Tone: There is mild increased tone in the LUE and mild to mod increased tone in the LUE Abnormal movements: There is L arm tremor that is near constant Coordination:  There is decremation, with all form of RAMS, including alternating supination and pronation of the forearm, hand opening and closing, finger taps, heel taps and toe taps on the L only.  RAMs on the right are good. Gait and Station: The patient has no difficulty arising out of a deep-seated chair without the use of the hands. The patient's stride length is normal with decreased arm swing on the L but there is the emergence of a resting tremor on the L with ambulation.  He is able to run down the hall well but tremor does re-emerge.  The patient has a negative pull test.      ASSESSMENT/PLAN:  1.  Idiopathic Parkinsons disease  -pt has sought 2nd opinion with Dr. Rexene Alberts.  Dx/plan was not changed but 2nd opinions are always welcome.  - We discussed this  today in detail.  I would advise that he start medication.   Discussed mirapex again today.   Discussed extensively r/b/se, including compulsive behaviors.  We will start and work up to 0.5 mg tid.  Risks, benefits, side effects and alternative therapies were discussed.  The opportunity to ask questions was given and they were answered to the best of my ability.  The patient expressed understanding and willingness to follow the outlined treatment protocols.  Discussed risk of compulsive behavior/sleep attacks.    -He is working with a  Clinical research associate m/w/f and I congratulated him on all the exercise he is doing.  Encouraged to increase the CV exercise  -invited to our PD symposium in [place]  F/u in 3-4 months.  Much greater than 50% of this visit was spent in counseling and coordinating care.  Total face to face time:  30  min

## 2016-04-14 ENCOUNTER — Encounter: Payer: Self-pay | Admitting: Neurology

## 2016-04-14 ENCOUNTER — Ambulatory Visit (INDEPENDENT_AMBULATORY_CARE_PROVIDER_SITE_OTHER): Payer: BLUE CROSS/BLUE SHIELD | Admitting: Neurology

## 2016-04-14 VITALS — BP 122/72 | HR 84 | Ht 74.5 in | Wt 205.0 lb

## 2016-04-14 DIAGNOSIS — G20 Parkinson's disease: Secondary | ICD-10-CM

## 2016-04-14 MED ORDER — PRAMIPEXOLE DIHYDROCHLORIDE 0.5 MG PO TABS: 0.5000 mg | ORAL_TABLET | Freq: Three times a day (TID) | ORAL | 2 refills | Status: DC

## 2016-04-14 MED ORDER — PRAMIPEXOLE DIHYDROCHLORIDE 0.125 MG PO TABS: ORAL_TABLET | ORAL | 0 refills | Status: DC

## 2016-04-14 NOTE — Patient Instructions (Signed)
1. Start mirapex (pramipexole) as follows:  0.125 mg - 1 tablet three times per day for a week, then 2 tablets three times per day for a week and then fill the 0.5 mg tablet and take that, 1 pill three times per day  

## 2016-04-20 DIAGNOSIS — M79671 Pain in right foot: Secondary | ICD-10-CM | POA: Diagnosis not present

## 2016-04-20 DIAGNOSIS — M79672 Pain in left foot: Secondary | ICD-10-CM | POA: Diagnosis not present

## 2016-04-20 DIAGNOSIS — L03032 Cellulitis of left toe: Secondary | ICD-10-CM | POA: Diagnosis not present

## 2016-04-20 DIAGNOSIS — G5761 Lesion of plantar nerve, right lower limb: Secondary | ICD-10-CM | POA: Diagnosis not present

## 2016-04-22 ENCOUNTER — Ambulatory Visit (INDEPENDENT_AMBULATORY_CARE_PROVIDER_SITE_OTHER): Payer: BLUE CROSS/BLUE SHIELD | Admitting: Family Medicine

## 2016-04-22 ENCOUNTER — Encounter: Payer: Self-pay | Admitting: Family Medicine

## 2016-04-22 VITALS — BP 126/76 | HR 66 | Temp 98.2°F | Ht 74.5 in | Wt 205.2 lb

## 2016-04-22 DIAGNOSIS — S39011A Strain of muscle, fascia and tendon of abdomen, initial encounter: Secondary | ICD-10-CM

## 2016-04-22 NOTE — Progress Notes (Signed)
Subjective:  Dan [name] is a 61 y.o. year old very pleasant male patient who presents for/with See problem oriented charting ROS- see ROS listed below   Past Medical History-  Patient Active Problem List   Diagnosis Date Noted  . Hypotestosteronemia 11/05/2015    Priority: High  . Elevated PSA 10/30/2014    Priority: High  . Parkinsonism ([place]) 10/30/2014    Priority: High  . Low serum vitamin D 04/16/2015    Priority: Medium  . Hyperlipidemia 05/24/2010    Priority: Medium  . Anxiety state 08/11/2006    Priority: Medium  . Bleeding hemorrhoid 05/24/2010    Priority: Low  . Irritable bowel syndrome 08/29/2007    Priority: Low    Medications- reviewed and updated Current Outpatient Prescriptions  Medication Sig Dispense Refill  . Ascorbic Acid (VITAMIN C PO) Take by mouth.    . Ascorbic Acid (VITAMIN C) 1000 MG tablet Take 1,000 mg by mouth daily.    . B Complex Vitamins (VITAMIN B COMPLEX PO) Take by mouth.    . cholecalciferol (VITAMIN D) 1000 units tablet Take 1,000 Units by mouth daily.    Marland Kitchen doxycycline (VIBRAMYCIN) 100 MG capsule Take 100 mg by mouth 2 (two) times daily.  3  . FOLIC ACID PO Take 878 mg by mouth.     Marland Kitchen glucosamine-chondroitin 500-400 MG tablet Take 1 tablet by mouth 3 (three) times daily.    Marland Kitchen L-Lysine 500 MG TABS Take 500 mg by mouth daily.    . Multiple Vitamin (MULTIVITAMIN) tablet Take 1 tablet by mouth daily.    . Omega-3 Fatty Acids (FISH OIL) 1000 MG CAPS Take 1,000 mg by mouth daily.    . pramipexole (MIRAPEX) 0.125 MG tablet One TID for one week, then two TID for one week, then switch to 0.5 mg tablets 63 tablet 0  . pramipexole (MIRAPEX) 0.5 MG tablet Take 1 tablet (0.5 mg total) by mouth 3 (three) times daily. 90 tablet 2  . saw palmetto 160 MG capsule Take 160 mg by mouth daily.    . clomiPHENE (CLOMID) 50 MG tablet Take 50 mg by mouth daily.  3   No current facility-administered medications for this visit.     Objective: BP 126/76  (BP Location: Left Arm, Patient Position: Sitting, Cuff Size: Large)   Pulse 66   Temp 98.2 F (36.8 C) (Oral)   Ht 6' 2.5" (1.892 m)   Wt 205 lb 3.2 oz (93.1 kg)   SpO2 97%   BMI 25.99 kg/m  Gen: NAD, resting comfortably CV: RRR no murmurs rubs or gallops Lungs: CTAB no crackles, wheeze, rhonchi Abdomen: soft/nontender except for pain in right lower quadrant with palpation and worse when patient flexes abdomen then prssed upon/nondistended/normal bowel sounds. No rebound or guarding.  Ext: no edema Skin: warm, dry, no redness over abdomen Neuro:  Baseline tremor, slow gait  Assessment/Plan:  Strain of abdominal muscle, initial encounter S: MWF going to gym - on Wednesday did leg press machine for first time in a while. Work yesterday did 9k steps. Since the gym on Wednesday he has had RLQ pain with stretching out. Mild to moderate when occurs. Sometimes changing positions bothers him. Lasts about a minute when occurs. Perhaps 10x a day- only occurring with certain movements.   ROS- no diarrhea, constipation, fever, nausea, vomiting, sweating. Regular daily bowel movements.  A/P: Abdominal strain. Some pain with palpation of RLQ but significantly worsens when pressed upon as he flexes his abdomen.  Conservative care per avs "The fact you had your appendix removed is reassuring  I would use ibuprofen or tylenol as needed for pain over next 1-2 weeks. Hopefully within 3 weeks this has calmed down  For 2 weeks take it easy with lifting- goal 5 lbs or less of lifting  If you were to develop diarrhea, constipation, fever, nausea, vomiting, sweating, worsening abdominal pain- I would want to see you back to reevalute"  Return precautions advised.  [name], MD

## 2016-04-22 NOTE — Patient Instructions (Addendum)
I think this is an abdominal strain- possibly from doing the leg press  The fact you had your appendix removed is reassuring  I would use ibuprofen or tylenol as needed for pain over next 1-2 weeks. Hopefully within 3 weeks this has calmed down  For 2 weeks take it easy with lifting- goal 5 lbs or less of lifting  If you were to develop diarrhea, constipation, fever, nausea, vomiting, sweating, worsening abdominal pain- I would want to see you back to reevalute

## 2016-04-22 NOTE — Progress Notes (Signed)
Pre visit review using our clinic review tool, if applicable. No additional management support is needed unless otherwise documented below in the visit note. 

## 2016-04-28 ENCOUNTER — Ambulatory Visit: Payer: BLUE CROSS/BLUE SHIELD | Admitting: Neurology

## 2016-05-04 ENCOUNTER — Ambulatory Visit: Payer: BLUE CROSS/BLUE SHIELD | Admitting: Neurology

## 2016-06-01 DIAGNOSIS — M79671 Pain in right foot: Secondary | ICD-10-CM | POA: Diagnosis not present

## 2016-06-01 DIAGNOSIS — G5761 Lesion of plantar nerve, right lower limb: Secondary | ICD-10-CM | POA: Diagnosis not present

## 2016-06-08 DIAGNOSIS — E291 Testicular hypofunction: Secondary | ICD-10-CM | POA: Diagnosis not present

## 2016-06-08 DIAGNOSIS — R972 Elevated prostate specific antigen [PSA]: Secondary | ICD-10-CM | POA: Diagnosis not present

## 2016-06-08 LAB — PSA: PSA: 4.34

## 2016-06-15 DIAGNOSIS — E291 Testicular hypofunction: Secondary | ICD-10-CM | POA: Diagnosis not present

## 2016-06-15 DIAGNOSIS — R972 Elevated prostate specific antigen [PSA]: Secondary | ICD-10-CM | POA: Diagnosis not present

## 2016-06-15 DIAGNOSIS — N400 Enlarged prostate without lower urinary tract symptoms: Secondary | ICD-10-CM | POA: Diagnosis not present

## 2016-06-27 ENCOUNTER — Encounter: Payer: Self-pay | Admitting: Family Medicine

## 2016-06-29 DIAGNOSIS — G5761 Lesion of plantar nerve, right lower limb: Secondary | ICD-10-CM | POA: Diagnosis not present

## 2016-07-18 NOTE — Progress Notes (Deleted)
Dan [name] was seen today in the movement disorders clinic for neurologic consultation at the request of Marin Olp, MD.   The patient presents today for neurologic consultation for left foot drop.  The patient's symptoms started about one year ago when the patient noticed that he began to drag his left leg with walking, especially at work.  He states that the leg feels heavy and he has to remind himself to pick the foot up.  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  His friends noted tremor about a year ago, but he didn't notice it until about 6 months ago when he went to pick up a coffee cup and spilled some coffee.  His partner noted that he could no longer keep up with him when they would take walks.  04/23/15 update:  The patient is here today for six-month follow-up.  I have reviewed records made available to me.  He has been working out with a trainer Monday, Wednesday, and Friday at [facility]).  He has noted that the L arm wants to "pull inward."  He has recently been noticing more fatigue.  He had some labwork in that regard.  His TSH was normal.  His B12 was normal at 427.  His vitamin D was slightly low and he was recently started on supplementation.  These labs were done on 04/10/2015.  He has not had any falls since last visit.  No lightheadedness or near syncope.  No hallucinations.  He continues to work at [employer] on [employer] but he puts in 12,000 steps a day.  He notes L arm tremor and feels an inner tremor in the L but no outer tremor.  Sometimes L foot will catch on the ground.    07/16/15 update:  The patient presents today for follow-up.  Last visit, the patient was formally diagnosed with idiopathic Parkinson's disease.  I felt that he would benefit from a dopamine agonist, but he wanted to hold on that.   Since our last visit, the patient feels that he has overall been doing well.  He has not had any falls.  No hallucinations.  No lightheadedness or near syncope.  He is going to the [facility] gym 3 days a week.  Dx with Vit D deficiency and started on replacement and that helped with fatigue.  Saw dermatology (Dr. Renda Rolls) today and had skin examination  10/22/15 update:  The patient follows up today.  He has a history of Parkinson's disease.  He has wanted no medication for this.  He has noted increased tremor in the L arm.  He is exercising at the [facility] gym 3 days a week. He does state that he has had plantar fasciitis in the L foot.  He saw podiatry and had a cortisone injection and it is helping.  He has seen his PCP.  He has had labs and his B12, testosterone, vit D is normal.   He has not had any falls.  No hallucinations.  No lightheadedness or near syncope.  Mood is improved.  He attributes this to the fact that his mother has been on list for friends home [place] and she is moving in first week of November.    01/14/16 update:  Patient follows up for his  history of Parkinson's disease.  He is on no medication, which is by his choice.  He continues to exercise 3 days per week at the [facility] gym and states that he works on core and balance.  Still working full time.  Pt denies falls.  Pt denies lightheadedness unless really working out hard at the gym and no near syncope.  No hallucinations.  No longer having RBD.    Does not that L hand shakes most of the time but not interfering with ADLS's The records that were made available to me were reviewed.  Dr. Yong Channel started patient on zoloft in November and appreciate talking with him about that.  Pt states that after talking with Dr. Yong Channel about the feelings, they resolved and he feels much better.  04/14/16 update:  Patient is seen today in follow-up.  Since our last visit, he did seek a second opinion with Dr. Rexene Alberts.  I have reviewed her records.  She agreed with the  diagnosis.  She agreed with the working plan.  The patient continues to [facility] 3 times per [facility] at [facility].  He changed working schedule so only working 4 days per week now.  He denies any falls.  More trouble getting in car with lifting left leg in the car.   He denies any lightheadedness or near syncope.  No hallucinations.  He continues to have left hand tremor and thinks that it has gotten worse.  ADL's are taking longer.  Taking nearly 2 hours to get ready in the AM.   No other new medical problems since our last visit.  Is ready to start medication.  Sleeping well.   07/20/16 update:  Patient seen today in follow-up.  Last visit, we decided to start him on pramipexole and work to 0.5 mg 3 times daily.  He denies sleep attacks.  Denies compulsive behaviors.  Denies hallucinations.  PREVIOUS MEDICATIONS: none to date  ALLERGIES:  No Known Allergies  CURRENT MEDICATIONS:  Outpatient Encounter Prescriptions as of 07/20/2016  Medication Sig  . Ascorbic Acid (VITAMIN C PO) Take by mouth.  . Ascorbic Acid (VITAMIN C) 1000 MG tablet Take 1,000 mg by mouth daily.  . B Complex Vitamins (VITAMIN B COMPLEX PO) Take by mouth.  . cholecalciferol (VITAMIN D) 1000 units tablet Take 1,000 Units by mouth daily.  . clomiPHENE (CLOMID) 50 MG tablet Take 50 mg by mouth daily.  Marland Kitchen doxycycline (VIBRAMYCIN) 100 MG capsule Take 100 mg by mouth 2 (two) times daily.  Marland Kitchen FOLIC ACID PO Take 161 mg by mouth.   Marland Kitchen glucosamine-chondroitin 500-400 MG tablet Take 1 tablet by mouth 3 (three) times daily.  Marland Kitchen L-Lysine 500 MG TABS Take 500 mg by mouth daily.  . Multiple Vitamin (MULTIVITAMIN) tablet Take 1 tablet by mouth daily.  . Omega-3 Fatty Acids (FISH OIL) 1000 MG CAPS Take 1,000 mg by mouth daily.  . pramipexole (MIRAPEX) 0.125 MG tablet One TID for one week, then two TID for one week, then switch to 0.5 mg tablets  . pramipexole (MIRAPEX) 0.5 MG tablet Take 1 tablet (0.5 mg total) by mouth 3 (three) times daily.  . saw  palmetto 160 MG capsule Take 160 mg by mouth daily.   No facility-administered encounter medications on file as of 07/20/2016.     PAST MEDICAL HISTORY:   Past Medical History:  Diagnosis Date  . Anxiety   . Bleeding hemorrhoid 05/24/2010  . Depression 08/11/2006   Anxiety as well around down market- tough time  with job loss in [employer]    . Hyperlipidemia LDL goal < 130 05/24/2010  . Irritable bowel syndrome 08/29/2007   Improved with diet change      PAST SURGICAL HISTORY:   Past Surgical History:  Procedure Laterality Date  . APPENDECTOMY    . COLONOSCOPY  05-01-00  . HERNIA REPAIR    . ROOT CANAL    . TONSILLECTOMY  1958    SOCIAL HISTORY:   Social History   Social History  . Marital status: Single    Spouse name: N/A  . Number of children: N/A  . Years of education: N/A   Occupational History  . Public relations account executive     works at [employer]   Social History Main Topics  . Smoking status: Never Smoker  . Smokeless tobacco: Never Used  . Alcohol use 0.0 oz/week     Comment: once a week  . Drug use: No  . Sexual activity: Not on file   Other Topics Concern  . Not on file   Social History Narrative   Lives alone. Long term partner - does not live with him.       Works at the [employer]    Formerly Designer, multimedia.    Goes to [employer]      Hobbies: Exercises regularly, relax with friends    FAMILY HISTORY:   Family Status  Relation Status  . Brother Alive       HIV controlled  . Brother Alive       HIV controlled  . Sister Alive       healthy  . Mother Alive       Breast cancer  . Father Deceased       40, prostate cancer 10 years prior to death     ROS:  A complete 10 system review of systems was obtained and was unremarkable apart from what is mentioned above.  PHYSICAL EXAMINATION:    VITALS:   There were no vitals filed for this visit. Wt Readings from Last 3  Encounters:  04/22/16 205 lb 3.2 oz (93.1 kg)  04/14/16 205 lb (93 kg)  02/18/16 201 lb (91.2 kg)     GEN:  The patient appears stated age and is in NAD. HEENT:  Normocephalic, atraumatic.  The mucous membranes are moist. The superficial temporal arteries are without ropiness or tenderness. CV:  RRR Lungs:  CTAB Neck/HEME:  There are no carotid bruits bilaterally.  Neurological examination:  Orientation: The patient is alert and oriented x3.  Cranial nerves: There is good facial symmetry.   Soft palate rises symmetrically and there is no tongue deviation. Hearing is intact to conversational tone. Sensation: Sensation is intact to light touch throughout Motor: Strength is 5/5 in the bilateral upper and lower extremities.   Shoulder shrug is equal and symmetric.  There is no pronator drift.  No foot drop noted   Movement examination: Tone: There is mild increased tone in the LUE and mild to mod increased tone in the LUE Abnormal movements: There is L arm tremor that is near constant Coordination:  There is decremation, with all form of RAMS, including alternating supination and pronation of the forearm, hand opening and closing, finger taps, heel taps and toe taps on the L only.  RAMs on the right are good. Gait and Station: The patient has no difficulty arising out of a deep-seated chair without the use of the hands. The  patient's stride length is normal with decreased arm swing on the L but there is the emergence of a resting tremor on the L with ambulation.  He is able to run down the hall well but tremor does re-emerge.  The patient has a negative pull test.      ASSESSMENT/PLAN:  1.  Idiopathic Parkinsons disease  -pt has sought 2nd opinion with Dr. Rexene Alberts.  Dx/plan was not changed but 2nd opinions are always welcome.  - We discussed this today in detail.  I would advise that he start medication.   Discussed mirapex again today.   Discussed extensively r/b/se, including compulsive  behaviors.  We will start and work up to 0.5 mg tid.  Risks, benefits, side effects and alternative therapies were discussed.  The opportunity to ask questions was given and they were answered to the best of my ability.  The patient expressed understanding and willingness to follow the outlined treatment protocols.  Discussed risk of compulsive behavior/sleep attacks.    -He is working with a Clinical research associate m/w/f and I congratulated him on all the exercise he is doing.  Encouraged to increase the CV exercise 2.  F/u in 3-4 months.  Much greater than 50% of this visit was spent in counseling and coordinating care.  Total face to face time:  30  min

## 2016-07-20 ENCOUNTER — Ambulatory Visit: Payer: BLUE CROSS/BLUE SHIELD | Admitting: Neurology

## 2016-07-20 ENCOUNTER — Ambulatory Visit (INDEPENDENT_AMBULATORY_CARE_PROVIDER_SITE_OTHER): Payer: BLUE CROSS/BLUE SHIELD | Admitting: Family Medicine

## 2016-07-20 ENCOUNTER — Encounter: Payer: Self-pay | Admitting: Family Medicine

## 2016-07-20 ENCOUNTER — Telehealth: Payer: Self-pay | Admitting: Neurology

## 2016-07-20 DIAGNOSIS — G20 Parkinson's disease: Secondary | ICD-10-CM

## 2016-07-20 DIAGNOSIS — R7989 Other specified abnormal findings of blood chemistry: Secondary | ICD-10-CM

## 2016-07-20 DIAGNOSIS — E785 Hyperlipidemia, unspecified: Secondary | ICD-10-CM

## 2016-07-20 NOTE — Progress Notes (Signed)
Subjective:  RIAAN TOLEDO is a 61 y.o. year old very pleasant male patient who presents for/with See problem oriented charting ROS- No chest pain or shortness of breath. No headache or blurry vision.  Continued tremor. Having some more fatigue by afternoon.    Past Medical History-  Patient Active Problem List   Diagnosis Date Noted  . Hypotestosteronemia 11/05/2015    Priority: High  . Elevated PSA 10/30/2014    Priority: High  . Parkinsonism ([place]) 10/30/2014    Priority: High  . Low serum vitamin D 04/16/2015    Priority: Medium  . Hyperlipidemia 05/24/2010    Priority: Medium  . Anxiety state 08/11/2006    Priority: Medium  . Bleeding hemorrhoid 05/24/2010    Priority: Low  . Irritable bowel syndrome 08/29/2007    Priority: Low    Medications- reviewed and updated Current Outpatient Prescriptions  Medication Sig Dispense Refill  . Ascorbic Acid (VITAMIN C) 1000 MG tablet Take 1,000 mg by mouth daily.    . B Complex Vitamins (VITAMIN B COMPLEX PO) Take by mouth.    . cholecalciferol (VITAMIN D) 1000 units tablet Take 1,000 Units by mouth daily.    Marland Kitchen doxycycline (VIBRAMYCIN) 100 MG capsule Take 100 mg by mouth 2 (two) times daily.  3  . FOLIC ACID PO Take 916 mg by mouth.     Marland Kitchen glucosamine-chondroitin 500-400 MG tablet Take 1 tablet by mouth 3 (three) times daily.    Marland Kitchen L-Lysine 500 MG TABS Take 500 mg by mouth daily.    . Multiple Vitamin (MULTIVITAMIN) tablet Take 1 tablet by mouth daily.    . Omega-3 Fatty Acids (FISH OIL) 1000 MG CAPS Take 1,000 mg by mouth daily.    . pramipexole (MIRAPEX) 0.5 MG tablet Take 1 tablet (0.5 mg total) by mouth 3 (three) times daily. 90 tablet 2  . saw palmetto 160 MG capsule Take 160 mg by mouth daily.     No current facility-administered medications for this visit.     Objective: BP 110/78 (BP Location: Left Arm, Patient Position: Sitting, Cuff Size: Large)   Pulse 85   Temp 98.5 F (36.9 C) (Oral)   Ht 6' 2.5" (1.892 m)   Wt  209 lb 12.8 oz (95.2 kg)   SpO2 96%   BMI 26.58 kg/m  Gen: NAD, resting comfortably CV: RRR no murmurs rubs or gallops Lungs: CTAB no crackles, wheeze, rhonchi Abdomen: soft/nontender/nondistended/normal bowel sounds. No rebound or guarding. overweight Ext: no edema Skin: warm, dry Neuro: tremor left hand at rest  Assessment/Plan:  Cap weight at 210  Low serum vitamin D S: vitamin D last April was 27. We boosted him with 8 weeks of 50k units A/P: update vitamin D in 6 months with CPE  Parkinsonism ([place]) S: continues his regular exercise pattern with parkinson's group on [email or web address] with mirapex TID- occasional bouts of nausea since getting back from [place] and wonders if medication related. Sees Dr. Carles Collet later today and will discuss that with her- also some constipation. Did cut back to 4 days - physically and mentally needed this transition.  A/P: I would advise trial of once a day stool softener- colace/docusate- could be related to drug but also could be related to parkinsons. He feels the nausea is tolerable but will ask Dr. Carles Collet about this.   Hyperlipidemia Resolved 2017 with regular exercise from parkinsons. We discussed continuing exercise, watching weight- recheck at CPE  11/04/16 or later for CPE  Return precautions advised.  [name], MD

## 2016-07-20 NOTE — Assessment & Plan Note (Signed)
Resolved 2017 with regular exercise from parkinsons. We discussed continuing exercise, watching weight- recheck at CPE

## 2016-07-20 NOTE — Assessment & Plan Note (Signed)
S: vitamin D last April was 27. We boosted him with 8 weeks of 50k units A/P: update vitamin D in 6 months with CPE

## 2016-07-20 NOTE — Telephone Encounter (Signed)
Caller: Pilar Plate   Urgent? No  Reason for the call: Dan [name] arrived 15 min late thinking his appt was at 1:45 instead of 1:30. He just had a question regarding his medication Pramipexole. He said he is still shaking and having side effects. Please call. Thanks

## 2016-07-20 NOTE — Assessment & Plan Note (Signed)
S: continues his regular exercise pattern with parkinson's group on [email or web address] with mirapex TID- occasional bouts of nausea since getting back from [place] and wonders if medication related. Sees Dr. Carles Collet later today and will discuss that with her- also some constipation. Did cut back to 4 days - physically and mentally needed this transition.  A/P: I would advise trial of once a day stool softener- colace/docusate- could be related to drug but also could be related to parkinsons. He feels the nausea is tolerable but will ask Dr. Carles Collet about this.

## 2016-07-20 NOTE — Patient Instructions (Addendum)
I would advise trial of once a day stool softener- colace/docusate  11/04/16 or later for physical. Come fasting and we will update labs.

## 2016-07-21 ENCOUNTER — Ambulatory Visit: Payer: BLUE CROSS/BLUE SHIELD | Admitting: Family Medicine

## 2016-07-21 NOTE — Telephone Encounter (Signed)
Left message on machine for patient to call back.

## 2016-07-23 ENCOUNTER — Encounter: Payer: Self-pay | Admitting: Neurology

## 2016-07-25 ENCOUNTER — Telehealth: Payer: Self-pay | Admitting: Family Medicine

## 2016-07-25 NOTE — Telephone Encounter (Signed)
Patient called back as I was typing message.   I offered sooner appt. He could not come in the two days I offered due to work. He states he has already stopped medication on his own. He wanted to keep previous appt.

## 2016-07-25 NOTE — Telephone Encounter (Signed)
Pt is requesting dr hunter to call him back personally. Pt did not elaborate the reason

## 2016-07-25 NOTE — Telephone Encounter (Signed)
Discussed concerns with patient. No medical needs

## 2016-08-12 ENCOUNTER — Ambulatory Visit: Payer: BLUE CROSS/BLUE SHIELD | Admitting: Neurology

## 2016-08-30 NOTE — Progress Notes (Signed)
Dan [name] was seen today in the movement disorders clinic for neurologic consultation at the request of Marin Olp, MD.   The patient presents today for neurologic consultation for left foot drop.  The patient's symptoms started about one year ago when the patient noticed that he began to drag his left leg with walking, especially at work.  He states that the leg feels heavy and he has to remind himself to pick the foot up.  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  His friends noted tremor about a year ago, but he didn't notice it until about 6 months ago when he went to pick up a coffee cup and spilled some coffee.  His partner noted that he could no longer keep up with him when they would take walks.  04/23/15 update:  The patient is here today for six-month follow-up.  I have reviewed records made available to me.  He has been working out with a trainer Monday, Wednesday, and Friday at [facility]).  He has noted that the L arm wants to "pull inward."  He has recently been noticing more fatigue.  He had some labwork in that regard.  His TSH was normal.  His B12 was normal at 427.  His vitamin D was slightly low and he was recently started on supplementation.  These labs were done on 04/10/2015.  He has not had any falls since last visit.  No lightheadedness or near syncope.  No hallucinations.  He continues to work at [employer] on [employer] but he puts in 12,000 steps a day.  He notes L arm tremor and feels an inner tremor in the L but no outer tremor.  Sometimes L foot will catch on the ground.    07/16/15 update:  The patient presents today for follow-up.  Last visit, the patient was formally diagnosed with idiopathic Parkinson's disease.  I felt that he would benefit from a dopamine agonist, but he wanted to hold on that.   Since our last visit, the patient feels that he has overall been doing well.  He has not had any falls.  No hallucinations.  No lightheadedness or near syncope.  He is going to the [facility] 3 days a week.  Dx with Vit D deficiency and started on replacement and that helped with fatigue.  Saw dermatology (Dr. Renda Rolls) today and had skin examination  10/22/15 update:  The patient follows up today.  He has a history of Parkinson's disease.  He has wanted no medication for this.  He has noted increased tremor in the L arm.  He is exercising at the [facility] 3 days a week. He does state that he has had plantar fasciitis in the L foot.  He saw podiatry and had a cortisone injection and it is helping.  He has seen his PCP.  He has had labs and his B12, testosterone, vit D is normal.   He has not had any falls.  No hallucinations.  No lightheadedness or near syncope.  Mood is improved.  He attributes this to the fact that his mother has been on list for friends home [place] and she is moving in first week of November.    01/14/16 update:  Patient follows up for his  history of Parkinson's disease.  He is on no medication, which is by his choice.  He continues to exercise 3 days per week at the [facility] and states that he works on core and balance.  Still working full time.  Pt denies falls.  Pt denies lightheadedness unless really working out hard at the gym and no near syncope.  No hallucinations.  No longer having RBD.    Does not that L hand shakes most of the time but not interfering with ADLS's The records that were made available to me were reviewed.  Dr. Yong Channel started patient on zoloft in November and appreciate talking with him about that.  Pt states that after talking with Dr. Yong Channel about the feelings, they resolved and he feels much better.  04/14/16 update:  Patient is seen today in follow-up.  Since our last visit, he did seek a second opinion with Dr. Rexene Alberts.  I have reviewed her records.  She agreed with the  diagnosis.  She agreed with the working plan.  The patient continues to [facility] 3 times per week at [facility].  He changed working schedule so only working 4 days per week now.  He denies any falls.  More trouble getting in car with lifting left leg in the car.   He denies any lightheadedness or near syncope.  No hallucinations.  He continues to have left hand tremor and thinks that it has gotten worse.  ADL's are taking longer.  Taking nearly 2 hours to get ready in the AM.   No other new medical problems since our last visit.  Is ready to start medication.  Sleeping well.   08/31/16 update:  Patient seen today in follow-up for his Parkinson's disease.  He was started on pramipexole last visit and work to 0.5 mg 3 times per day.  He ended up stopping the medication in early July.  He felt that it worsened feeling of being "blue" (interestingly, this medication has been used for the treatment of depression in the past) and felt that it was causing morning headache, constipation and mild nausea.  Strangely, the headache would go away once awake (and medication is off at night).  He also did not think that was particularly helping his tremor.  Tremor is worse at work but better during the evening.  If he gets agitated, he will have no tremor.  Otherwise, he feels better off of the medication.  He does think that he needs to cut back more at work because he is pretty exhausted when he gets home and worries he will have an accident in the shower if he is too tired (he hasn't).  He took zoloft for 2 days and felt it didn't work and d/c it.  He did injure the L hip and occasionally it goes down the left leg.  He just did that a week ago.    PREVIOUS MEDICATIONS: Pramipexole (patient thought that it worsened depression and caused headache and nausea)  ALLERGIES:  No Known Allergies  CURRENT MEDICATIONS:  Outpatient Encounter Prescriptions as of 08/31/2016  Medication Sig  . Ascorbic Acid (VITAMIN C) 1000 MG tablet Take  1,000 mg by mouth daily.  . B Complex Vitamins (VITAMIN B COMPLEX PO) Take by mouth.  . cholecalciferol (VITAMIN D) 1000 units tablet Take 1,000 Units by mouth daily.  Marland Kitchen doxycycline (VIBRAMYCIN) 100 MG capsule Take 100 mg by mouth 2 (two) times daily.  Marland Kitchen FOLIC ACID PO Take 237 mg by mouth.   Marland Kitchen  glucosamine-chondroitin 500-400 MG tablet Take 1 tablet by mouth 3 (three) times daily.  Marland Kitchen L-Lysine 500 MG TABS Take 500 mg by mouth daily.  . Multiple Vitamin (MULTIVITAMIN) tablet Take 1 tablet by mouth daily.  . Omega-3 Fatty Acids (FISH OIL) 1000 MG CAPS Take 1,000 mg by mouth daily.  . saw palmetto 160 MG capsule Take 160 mg by mouth daily.  . [DISCONTINUED] pramipexole (MIRAPEX) 0.5 MG tablet Take 1 tablet (0.5 mg total) by mouth 3 (three) times daily.   No facility-administered encounter medications on file as of 08/31/2016.     PAST MEDICAL HISTORY:   Past Medical History:  Diagnosis Date  . Anxiety   . Bleeding hemorrhoid 05/24/2010  . Depression 08/11/2006   Anxiety as well around down market- tough time with job loss in [employer]    . Hyperlipidemia LDL goal < 130 05/24/2010  . Irritable bowel syndrome 08/29/2007   Improved with diet change      PAST SURGICAL HISTORY:   Past Surgical History:  Procedure Laterality Date  . APPENDECTOMY    . COLONOSCOPY  05-01-00  . HERNIA REPAIR    . ROOT CANAL    . TONSILLECTOMY  1958    SOCIAL HISTORY:   Social History   Social History  . Marital status: Single    Spouse name: N/A  . Number of children: N/A  . Years of education: N/A   Occupational History  . Public relations account executive     works at [employer]   Social History Main Topics  . Smoking status: Never Smoker  . Smokeless tobacco: Never Used  . Alcohol use 0.0 oz/week     Comment: once a week  . Drug use: No  . Sexual activity: Not on file   Other Topics Concern  . Not on file   Social History Narrative   Lives alone. Long term partner - does not live with him.        Works at the [employer]    Formerly Designer, multimedia.    Goes to [employer]      Hobbies: Exercises regularly, relax with friends    FAMILY HISTORY:   Family Status  Relation Status  . Brother Alive       HIV controlled  . Brother Alive       HIV controlled  . Sister Alive       healthy  . Mother Alive       Breast cancer  . Father Deceased       71, prostate cancer 10 years prior to death     ROS:  A complete 10 system review of systems was obtained and was unremarkable apart from what is mentioned above.  PHYSICAL EXAMINATION:    VITALS:   Vitals:   08/31/16 1004  BP: 130/74  Pulse: 72  SpO2: 95%  Weight: 203 lb (92.1 kg)  Height: 6' 2.5" (1.892 m)   Wt Readings from Last 3 Encounters:  08/31/16 203 lb (92.1 kg)  07/20/16 209 lb 12.8 oz (95.2 kg)  04/22/16 205 lb 3.2 oz (93.1 kg)     GEN:  The patient appears stated age and is in NAD. HEENT:  Normocephalic, atraumatic.  The mucous membranes are moist. The superficial temporal arteries are without ropiness or tenderness. CV:  RRR Lungs:  CTAB Neck/HEME:  There are no carotid bruits bilaterally.  Neurological examination:  Orientation: The patient is alert and oriented x3.  Cranial nerves: There is good facial symmetry.   Soft palate rises symmetrically and there is no tongue deviation. Hearing is intact to conversational tone. Sensation: Sensation is intact to light touch throughout Motor: Strength is 5/5 in the bilateral upper and lower extremities.   Shoulder shrug is equal and symmetric.  There is no pronator drift.  No foot drop noted   Movement examination: Tone: There is mild increased tone in the LUE and mild to mod increased tone in the LUE Abnormal movements: There is a constant LUE resting tremor and a mild tremor in the R thumb that is intermittent Coordination:  There is decremation with any form of RAMS, including alternating  supination and pronation of the forearm, hand opening and closing, finger taps, heel taps and toe taps on the L.   RAMs on the right are good. Gait and Station: The patient has mild difficulty arising out of a deep-seated chair without the use of the hands (due to hip pain on the right). The patient's stride length is normal with decreased arm swing on the L but there is the emergence of a resting tremor on the L with ambulation.  He drags the L leg due to hip pain and walk is a bit antalgic  ASSESSMENT/PLAN:  1.  Idiopathic Parkinsons disease  -pt has sought 2nd opinion with Dr. Rexene Alberts.  Dx/plan was not changed but 2nd opinions are always welcome.  -Patient continues to exercise regularly with a trainer and encouraged him to continue to do so.  -As above, the patient stopped his pramipexole as he thought that it worsened depressive symptoms (although this medication has been used as a treatment for depression in the past and other patients).  He also thought it caused headache, constipation and nausea.    -Pt asks about levodopa and asks about that today.  I think it would be reasonable.  I talked to him about possiblity of levodopa resistant tremor.  Will work to carbidopa/levodopa 25/100 tid  -is having more trouble working and told him that we will support him in whatever needs are  -handicap decal provided today 2.  Follow up is anticipated in the next few months, sooner should new neurologic issues arise.Much greater than 50% of this visit was spent in counseling and coordinating care.  Total face to face time:  30 min

## 2016-08-31 ENCOUNTER — Encounter: Payer: Self-pay | Admitting: Neurology

## 2016-08-31 ENCOUNTER — Ambulatory Visit (INDEPENDENT_AMBULATORY_CARE_PROVIDER_SITE_OTHER): Payer: BLUE CROSS/BLUE SHIELD | Admitting: Neurology

## 2016-08-31 VITALS — BP 130/74 | HR 72 | Ht 74.5 in | Wt 203.0 lb

## 2016-08-31 DIAGNOSIS — G20 Parkinson's disease: Secondary | ICD-10-CM

## 2016-08-31 MED ORDER — CARBIDOPA-LEVODOPA 25-100 MG PO TABS: 1.0000 | ORAL_TABLET | Freq: Three times a day (TID) | ORAL | 3 refills | Status: DC

## 2016-08-31 NOTE — Patient Instructions (Signed)
Start carbidopa/levodopa 25/100, 1/2 tab three times a day 30 min before meals x 1 wk, then 1/2 in am & noon & 1 in evening for a week, then 1/2 in am &1 at noon &one in evening for a week, then 1 tablet three times a day 30 min before meals

## 2016-09-19 ENCOUNTER — Telehealth: Payer: Self-pay | Admitting: Family Medicine

## 2016-09-19 NOTE — Telephone Encounter (Signed)
Noted  

## 2016-09-19 NOTE — Telephone Encounter (Signed)
Patient Name: Dan [name]  DOB: 29-Dec-1955    Initial Comment Caller states he has been on new medicine for the third week after increasing slowly. He is now having heaviness in his chest and heavy breathing. The medicine is Levadopa, for Parkinson's disease.   Nurse Assessment  Nurse: Leilani Merl, RN, Heather Date/Time (Eastern Time): 09/19/2016 11:59:38 AM  Confirm and document reason for call. If symptomatic, describe symptoms. ---Caller states he has been on new medicine for the third week after increasing slowly. He is now having heaviness in his chest and heavy breathing. The medicine is Levadopa, for Parkinson's disease. No chest pain  Does the patient have any new or worsening symptoms? ---Yes  Will a triage be completed? ---Yes  Related visit to physician within the last 2 weeks? ---No  Does the PT have any chronic conditions? (i.e. diabetes, asthma, etc.) ---Yes  List chronic conditions. ---See MR  Is this a behavioral health or substance abuse call? ---No     Guidelines    Guideline Title Affirmed Question Affirmed Notes  Chest Pain [1] Chest pain lasting <= 5 minutes AND [2] NO chest pain or cardiac symptoms now (Exceptions: pains lasting a few seconds)    Final Disposition User   See Physician within 24 Hours Standifer, RN, Water quality scientist    Comments  Appt with Dr. Yong Channel tomorrow at [place] [facility]

## 2016-09-20 ENCOUNTER — Ambulatory Visit (INDEPENDENT_AMBULATORY_CARE_PROVIDER_SITE_OTHER)
Admission: RE | Admit: 2016-09-20 | Discharge: 2016-09-20 | Disposition: A | Discharge status: Home / Self Care | Payer: BLUE CROSS/BLUE SHIELD | Source: Ambulatory Visit | Attending: Family Medicine | Admitting: Family Medicine

## 2016-09-20 ENCOUNTER — Encounter: Payer: Self-pay | Admitting: Family Medicine

## 2016-09-20 ENCOUNTER — Ambulatory Visit (INDEPENDENT_AMBULATORY_CARE_PROVIDER_SITE_OTHER): Payer: BLUE CROSS/BLUE SHIELD | Admitting: Family Medicine

## 2016-09-20 VITALS — BP 100/64 | HR 80 | Temp 98.7°F | Ht 74.5 in | Wt 200.8 lb

## 2016-09-20 DIAGNOSIS — R0789 Other chest pain: Secondary | ICD-10-CM | POA: Diagnosis not present

## 2016-09-20 DIAGNOSIS — R0600 Dyspnea, unspecified: Secondary | ICD-10-CM

## 2016-09-20 DIAGNOSIS — R0602 Shortness of breath: Secondary | ICD-10-CM | POA: Diagnosis not present

## 2016-09-20 LAB — COMPREHENSIVE METABOLIC PANEL
ALBUMIN: 4.5 g/dL (ref 3.5–5.2)
ALK PHOS: 44 U/L (ref 39–117)
ALT: 9 U/L (ref 0–53)
AST: 16 U/L (ref 0–37)
BILIRUBIN TOTAL: 0.6 mg/dL (ref 0.2–1.2)
BUN: 16 mg/dL (ref 6–23)
CO2: 31 meq/L (ref 19–32)
Calcium: 9.8 mg/dL (ref 8.4–10.5)
Chloride: 102 mEq/L (ref 96–112)
Creatinine, Ser: 0.85 mg/dL (ref 0.40–1.50)
GFR: 97.39 mL/min (ref >60.00)
GLUCOSE: 80 mg/dL (ref 70–99)
Potassium: 4.2 mEq/L (ref 3.5–5.1)
SODIUM: 140 meq/L (ref 135–145)
Total Protein: 6.8 g/dL (ref 6.0–8.3)

## 2016-09-20 LAB — CBC
HEMATOCRIT: 45.5 % (ref 39.0–52.0)
Hemoglobin: 15.1 g/dL (ref 13.0–17.0)
MCHC: 33.2 g/dL (ref 30.0–36.0)
MCV: 95.7 fl (ref 78.0–100.0)
Platelets: 201 10*3/uL (ref 150.0–400.0)
RBC: 4.76 Mil/uL (ref 4.22–5.81)
RDW: 13.6 % (ref 11.5–15.5)
WBC: 5.6 10*3/uL (ref 4.0–10.5)

## 2016-09-20 LAB — BRAIN NATRIURETIC PEPTIDE: Pro B Natriuretic peptide (BNP): 9 pg/mL (ref 0.0–100.0)

## 2016-09-20 NOTE — Patient Instructions (Signed)
DG Chest 2 View- rule out pneumonia CBC-rule out low blood counts Comprehensive metabolic panel- evaluate kidney and liver  D-dimer- rule out blood clot. If test positive would need to do CT scan of chest Quantitative, Brain Natriuretic Peptide- evaluate for heart failure  Update me in 1 week-if symptoms persist and no cause found on above testing would then get stress test given age and being a male. Let us know immediately if new or worsening symptoms   Please go to [facility] - located [address] across the [address] from [place] - in the basement - Hours: 8:30-5:30 PM M-F. Do not need appointment.

## 2016-09-20 NOTE — Progress Notes (Signed)
Subjective:  Dan [name] is a 61 y.o. year old very pleasant male patient who presents for/with See problem oriented charting ROS- no chest pain. No dizziness. No edema. Admits to some stress but no clear strong increase from baseline.    Past Medical History-  Patient Active Problem List   Diagnosis Date Noted  . Hypotestosteronemia 11/05/2015    Priority: High  . Elevated PSA 10/30/2014    Priority: High  . Parkinsonism ([place]) 10/30/2014    Priority: High  . Low serum vitamin D 04/16/2015    Priority: Medium  . Hyperlipidemia 05/24/2010    Priority: Medium  . Anxiety state 08/11/2006    Priority: Medium  . Bleeding hemorrhoid 05/24/2010    Priority: Low  . Irritable bowel syndrome 08/29/2007    Priority: Low    Medications- reviewed and updated Current Outpatient Prescriptions  Medication Sig Dispense Refill  . Ascorbic Acid (VITAMIN C) 1000 MG tablet Take 1,000 mg by mouth daily.    . B Complex Vitamins (VITAMIN B COMPLEX PO) Take by mouth.    . carbidopa-levodopa (SINEMET IR) 25-100 MG tablet Take 1 tablet by mouth 3 (three) times daily. 90 tablet 3  . cholecalciferol (VITAMIN D) 1000 units tablet Take 1,000 Units by mouth daily.    Marland Kitchen doxycycline (VIBRAMYCIN) 100 MG capsule Take 100 mg by mouth 2 (two) times daily.  3  . FOLIC ACID PO Take 193 mg by mouth.     Marland Kitchen glucosamine-chondroitin 500-400 MG tablet Take 1 tablet by mouth 3 (three) times daily.    Marland Kitchen L-Lysine 500 MG TABS Take 500 mg by mouth daily.    . Multiple Vitamin (MULTIVITAMIN) tablet Take 1 tablet by mouth daily.    . Omega-3 Fatty Acids (FISH OIL) 1000 MG CAPS Take 1,000 mg by mouth daily.    . saw palmetto 160 MG capsule Take 160 mg by mouth daily.     No current facility-administered medications for this visit.     Objective: BP 100/64 (BP Location: Left Arm, Patient Position: Sitting, Cuff Size: Large)   Pulse 80   Temp 98.7 F (37.1 C) (Oral)   Ht 6' 2.5" (1.892 m)   Wt 200 lb 12.8 oz (91.1 kg)    SpO2 96%   BMI 25.44 kg/m  Gen: NAD, resting comfortably No jvd or hepatojugular reflux CV: RRR no murmurs rubs or gallops Lungs: CTAB no crackles, wheeze, rhonchi Abdomen: soft/nontender/nondistended/normal bowel sounds. No rebound or guarding.  Ext: no edema Skin: warm, dry Neuro: tremor noted per baseline  EKG: sinus rhythm with rate 60- first degree block based on PR 204 msec, qrs prolonged, rSR' in v1. No st or t wave changes. Unable to pull up EKG from 2011 but notes RSR in V1.   Assessment/Plan:  Atypical chest pain - Plan: EKG 12-Lead  Dyspnea, unspecified type - Plan: DG Chest 2 View, CBC, Comprehensive metabolic panel, D-dimer, Quantitative, Brain Natriuretic Peptide, CBC, Comprehensive metabolic panel, D-dimer, Quantitative, Brain Natriuretic Peptide S: occasionally feeling some fatigued feeling in his chest- feels like he is carrying something heavy even with normal activities. On Monday at the gym and noted it very slightly. No cough with it. No fever. Wonders if it could be stress related. No shortness of breath at rest.   No chest pain. No dizziness. No nausea. No left arm or neck pain. No sweating with it. Wonders about sinemet causing this- listed as less than 1 % on up todate. Never smoker.   No  recent prolonged travel. Gets out of car every hour when went to the beach. No leg edema.  A/P:  Unclear cause. Risk factors for him for cardiac cause include age, hyperlipidemia in past though currently not present, male sex. EKG without obvious ischemic cause. Symptoms are somewhat mild. Did opt to do lab workup to start and CXR. If no better in a week and workup unrevealing then would get stress test- likely pharmacological given his parkinsons. D-dimer checked but strongly doubt PE and no signs of DVT.   Of note- initially reported chest pain to [facility], LPN so ekg ordered under that but later clarified and denies chest pain- only heavy breathing with exertion.    Future Appointments Date Time Provider [place]  11/09/2016 8:45 AM Marin Olp, MD [facility] None  12/01/2016 8:15 AM Tat, Eustace Quail, DO [facility] None    Orders Placed This Encounter  Procedures  . DG Chest 2 View    Standing Status:   Future    Number of Occurrences:   1    Standing Expiration Date:   11/20/2017    Order Specific Question:   Reason for Exam (SYMPTOM  OR DIAGNOSIS REQUIRED)    Answer:   dyspnea for 3-4 days with activity    Order Specific Question:   Preferred imaging location?    Answer:   [facility]  . CBC    Standing Status:   Future    Number of Occurrences:   1    Standing Expiration Date:   09/20/2017  . Comprehensive metabolic panel    [place]    Standing Status:   Future    Number of Occurrences:   1    Standing Expiration Date:   09/20/2017  . D-dimer, Quantitative    Standing Status:   Future    Number of Occurrences:   1    Standing Expiration Date:   09/20/2017  . Brain Natriuretic Peptide    Standing Status:   Future    Number of Occurrences:   1    Standing Expiration Date:   09/20/2017  . EKG 12-Lead    Order Specific Question:   Where should this test be performed    Answer:   Other   The duration of face-to-face time during this visit was greater than 25 minutes. Greater than 50% of this time was spent in counseling, explanation of diagnosis, planning of further management, and/or coordination of care including discussing EKG results, testing for dyspnea, possible causes .    Return precautions advised.  [name], MD

## 2016-09-21 LAB — D-DIMER, QUANTITATIVE (NOT AT ARMC): D DIMER QUANT: 0.36 ug{FEU}/mL (ref <0.50)

## 2016-09-22 ENCOUNTER — Telehealth: Payer: Self-pay | Admitting: Neurology

## 2016-09-22 NOTE — Telephone Encounter (Signed)
He has been taking Carbidopa Levodopa for 3 weeks now. He would like to know when should he start to see changes? He also wanted to know if any other patient's taking this medication have complained of heaviness in their chest? He said he has seen his other Doctor and had test ran as well to make sure he was ok. Please Advise. Thanks

## 2016-09-22 NOTE — Telephone Encounter (Signed)
Spoke with patient. He has just started full dose of medication today. I let him know while the effects of medication should be immediate, he may not be on a high enough dose to notice the effects yet since he just now is on the full dose of one tablet TID. He will continue Levodopa for now.   He has had chest heaviness worked up with PCP with chest xray/EKG/labs/etc. I let him know I haven't heard of this reaction with Levodopa but I would run this by Dr. Carles Collet.   Please advise.

## 2016-09-22 NOTE — Telephone Encounter (Signed)
carbidopa/levodopa 25/100 should not result in chest heaviness.  Also, carbidopa/levodopa may be less effective with tremor than the agonist drugs (which is why I tried to start with mirapex in him) but still can help with rigidity and slowness and stiffness even if not helping tremor.  He should discuss cp with pcp.

## 2016-09-23 NOTE — Telephone Encounter (Signed)
Patient made aware.

## 2016-09-28 DIAGNOSIS — R972 Elevated prostate specific antigen [PSA]: Secondary | ICD-10-CM | POA: Diagnosis not present

## 2016-10-24 DIAGNOSIS — M5441 Lumbago with sciatica, right side: Secondary | ICD-10-CM | POA: Diagnosis not present

## 2016-10-24 DIAGNOSIS — M9903 Segmental and somatic dysfunction of lumbar region: Secondary | ICD-10-CM | POA: Diagnosis not present

## 2016-10-25 DIAGNOSIS — M5441 Lumbago with sciatica, right side: Secondary | ICD-10-CM | POA: Diagnosis not present

## 2016-10-25 DIAGNOSIS — M9903 Segmental and somatic dysfunction of lumbar region: Secondary | ICD-10-CM | POA: Diagnosis not present

## 2016-10-26 DIAGNOSIS — M9903 Segmental and somatic dysfunction of lumbar region: Secondary | ICD-10-CM | POA: Diagnosis not present

## 2016-10-26 DIAGNOSIS — M5441 Lumbago with sciatica, right side: Secondary | ICD-10-CM | POA: Diagnosis not present

## 2016-10-27 DIAGNOSIS — M5441 Lumbago with sciatica, right side: Secondary | ICD-10-CM | POA: Diagnosis not present

## 2016-10-27 DIAGNOSIS — M9903 Segmental and somatic dysfunction of lumbar region: Secondary | ICD-10-CM | POA: Diagnosis not present

## 2016-10-31 DIAGNOSIS — M5441 Lumbago with sciatica, right side: Secondary | ICD-10-CM | POA: Diagnosis not present

## 2016-10-31 DIAGNOSIS — M9903 Segmental and somatic dysfunction of lumbar region: Secondary | ICD-10-CM | POA: Diagnosis not present

## 2016-11-01 DIAGNOSIS — M9903 Segmental and somatic dysfunction of lumbar region: Secondary | ICD-10-CM | POA: Diagnosis not present

## 2016-11-01 DIAGNOSIS — M5441 Lumbago with sciatica, right side: Secondary | ICD-10-CM | POA: Diagnosis not present

## 2016-11-02 DIAGNOSIS — M5441 Lumbago with sciatica, right side: Secondary | ICD-10-CM | POA: Diagnosis not present

## 2016-11-02 DIAGNOSIS — M9903 Segmental and somatic dysfunction of lumbar region: Secondary | ICD-10-CM | POA: Diagnosis not present

## 2016-11-03 DIAGNOSIS — M5441 Lumbago with sciatica, right side: Secondary | ICD-10-CM | POA: Diagnosis not present

## 2016-11-03 DIAGNOSIS — M9903 Segmental and somatic dysfunction of lumbar region: Secondary | ICD-10-CM | POA: Diagnosis not present

## 2016-11-07 DIAGNOSIS — M9903 Segmental and somatic dysfunction of lumbar region: Secondary | ICD-10-CM | POA: Diagnosis not present

## 2016-11-07 DIAGNOSIS — M5441 Lumbago with sciatica, right side: Secondary | ICD-10-CM | POA: Diagnosis not present

## 2016-11-08 DIAGNOSIS — M9903 Segmental and somatic dysfunction of lumbar region: Secondary | ICD-10-CM | POA: Diagnosis not present

## 2016-11-08 DIAGNOSIS — M5441 Lumbago with sciatica, right side: Secondary | ICD-10-CM | POA: Diagnosis not present

## 2016-11-09 ENCOUNTER — Encounter: Payer: Self-pay | Admitting: Family Medicine

## 2016-11-09 ENCOUNTER — Ambulatory Visit (INDEPENDENT_AMBULATORY_CARE_PROVIDER_SITE_OTHER): Payer: BLUE CROSS/BLUE SHIELD | Admitting: Family Medicine

## 2016-11-09 VITALS — BP 126/88 | HR 69 | Temp 98.3°F | Ht 74.5 in | Wt 201.6 lb

## 2016-11-09 DIAGNOSIS — Z0000 Encounter for general adult medical examination without abnormal findings: Secondary | ICD-10-CM

## 2016-11-09 DIAGNOSIS — R7989 Other specified abnormal findings of blood chemistry: Secondary | ICD-10-CM

## 2016-11-09 DIAGNOSIS — E349 Endocrine disorder, unspecified: Secondary | ICD-10-CM | POA: Diagnosis not present

## 2016-11-09 DIAGNOSIS — F411 Generalized anxiety disorder: Secondary | ICD-10-CM

## 2016-11-09 DIAGNOSIS — M9903 Segmental and somatic dysfunction of lumbar region: Secondary | ICD-10-CM | POA: Diagnosis not present

## 2016-11-09 DIAGNOSIS — E785 Hyperlipidemia, unspecified: Secondary | ICD-10-CM

## 2016-11-09 DIAGNOSIS — M5441 Lumbago with sciatica, right side: Secondary | ICD-10-CM | POA: Diagnosis not present

## 2016-11-09 LAB — VITAMIN D 25 HYDROXY (VIT D DEFICIENCY, FRACTURES): VITD: 50.56 ng/mL (ref 30.00–100.00)

## 2016-11-09 LAB — LIPID PANEL
CHOLESTEROL: 180 mg/dL (ref 0–200)
HDL: 44.3 mg/dL (ref >39.00)
LDL Cholesterol: 108 mg/dL — ABNORMAL HIGH (ref 0–99)
NonHDL: 135.43
TRIGLYCERIDES: 139 mg/dL (ref 0.0–149.0)
Total CHOL/HDL Ratio: 4
VLDL: 27.8 mg/dL (ref 0.0–40.0)

## 2016-11-09 NOTE — Assessment & Plan Note (Signed)
Low Testosterone- also on every other day clomid through urology in past- has been off for some time

## 2016-11-09 NOTE — Assessment & Plan Note (Signed)
HLD- 2 years ago- normalized last year- update lipids. Sip of coffee with milk this morning- doubt substantially will change lipids

## 2016-11-09 NOTE — Assessment & Plan Note (Signed)
Low vitamin D- slightly low last check- treated with 50k 8 weeks

## 2016-11-09 NOTE — Patient Instructions (Signed)
Please stop by lab before you go  Glad you are doing well- hope the sciatica continues to improve with chiropractic care

## 2016-11-09 NOTE — Assessment & Plan Note (Signed)
Improved 2018- not on zoloft

## 2016-11-09 NOTE — Progress Notes (Signed)
Phone: [phone]  Subjective:  Patient presents today for their annual physical. Chief complaint-noted.   See problem oriented charting- ROS- full  review of systems was completed and negative except for: baseline tremor, some pain into right leg, slight contracture in left arm  The following were reviewed and entered/updated in epic: Past Medical History:  Diagnosis Date  . Anxiety   . Bleeding hemorrhoid 05/24/2010  . Depression 08/11/2006   Anxiety as well around down market- tough time with job loss in [employer]    . Hyperlipidemia LDL goal < 130 05/24/2010  . Irritable bowel syndrome 08/29/2007   Improved with diet change     Patient Active Problem List   Diagnosis Date Noted  . Hypotestosteronemia 11/05/2015    Priority: High  . Elevated PSA 10/30/2014    Priority: High  . Parkinsonism ([place]) 10/30/2014    Priority: High  . Low serum vitamin D 04/16/2015    Priority: Medium  . Hyperlipidemia 05/24/2010    Priority: Medium  . Anxiety state 08/11/2006    Priority: Medium  . Bleeding hemorrhoid 05/24/2010    Priority: Low  . Irritable bowel syndrome 08/29/2007    Priority: Low   Past Surgical History:  Procedure Laterality Date  . APPENDECTOMY    . COLONOSCOPY  05-01-00  . HERNIA REPAIR    . ROOT CANAL    . TONSILLECTOMY  1958    Family History  Problem Relation Age of Onset  . Hypertension Mother   . Breast cancer Mother   . Prostate cancer Father        complications from radioactive seeds, colostomy for last 10 years    Medications- reviewed and updated Current Outpatient Medications  Medication Sig Dispense Refill  . Ascorbic Acid (VITAMIN C) 1000 MG tablet Take 1,000 mg by mouth daily.    . B Complex Vitamins (VITAMIN B COMPLEX PO) Take by mouth.    . carbidopa-levodopa (SINEMET IR) 25-100 MG tablet Take 1 tablet by mouth 3 (three) times daily. 90 tablet 3  . cholecalciferol (VITAMIN D) 1000 units tablet Take 1,000 Units by mouth daily.    Marland Kitchen  doxycycline (VIBRAMYCIN) 100 MG capsule Take 100 mg by mouth 2 (two) times daily.  3  . FOLIC ACID PO Take 193 mg by mouth.     Marland Kitchen glucosamine-chondroitin 500-400 MG tablet Take 1 tablet by mouth 3 (three) times daily.    Marland Kitchen L-Lysine 500 MG TABS Take 500 mg by mouth daily.    . Multiple Vitamin (MULTIVITAMIN) tablet Take 1 tablet by mouth daily.    . Omega-3 Fatty Acids (FISH OIL) 1000 MG CAPS Take 1,000 mg by mouth daily.    . saw palmetto 160 MG capsule Take 160 mg by mouth daily.     No current facility-administered medications for this visit.     Allergies-reviewed and updated No Known Allergies  Social History   Socioeconomic History  . Marital status: Single    Spouse name: None  . Number of children: None  . Years of education: None  . Highest education level: None  Social Needs  . Financial resource strain: None  . Food insecurity - worry: None  . Food insecurity - inability: None  . Transportation needs - medical: None  . Transportation needs - non-medical: None  Occupational History  . Occupation: Public relations account executive    Comment: works at [employer]  Tobacco Use  . Smoking status: Never Smoker  . Smokeless tobacco: Never Used  Substance  and Sexual Activity  . Alcohol use: Yes    Alcohol/week: 0.0 oz    Comment: once a week  . Drug use: No  . Sexual activity: None  Other Topics Concern  . None  Social History Narrative   Lives alone. Long term partner - does not live with him.       Works at the [employer]    Formerly Designer, multimedia.    Goes to [employer]      Hobbies: Exercises regularly, relax with friends    Objective: BP 126/88 (BP Location: Left Arm, Patient Position: Sitting, Cuff Size: Large)   Pulse 69   Temp 98.3 F (36.8 C) (Oral)   Ht 6' 2.5" (1.892 m)   Wt 201 lb 9.6 oz (91.4 kg)   SpO2 96%   BMI 25.54 kg/m  Gen: NAD, resting comfortably HEENT: Mucous membranes are moist.  Oropharynx normal Neck: no thyromegaly CV: RRR no murmurs rubs or gallops Lungs: CTAB no crackles, wheeze, rhonchi Abdomen: soft/nontender/nondistended/normal bowel sounds. No rebound or guarding.  Ext: no edema Skin: warm, dry Neuro: grossly normal, moves all extremities, PERRLA, resting tremor noted, shorter gait, slight contracture No rectal as sees urology  Assessment/Plan:  61 y.o. male presenting for annual physical.  Health Maintenance counseling: 1. Anticipatory guidance: Patient counseled regarding regular dental exams q6 months, eye exams yearly, wearing seatbelts.  2. Risk factor reduction:  Advised patient of need for regular exercise and diet rich and fruits and vegetables to reduce risk of heart attack and stroke. Exercise- continues exercise classes- yoga- and trainer MWF for parkinsons- has been down recently with sciatica. Diet-reasonable- largely stable weight over last few months.  Wt Readings from Last 3 Encounters:  11/09/16 201 lb 9.6 oz (91.4 kg)  09/20/16 200 lb 12.8 oz (91.1 kg)  08/31/16 203 lb (92.1 kg)  3. Immunizations/screenings/ancillary studies- had shingrix and flu - will let us know dates through [employer] History  Administered Date(s) Administered  . Influenza Whole 01/03/2002  . Influenza-Unspecified 09/21/2014, 10/08/2015  . Td 10/03/1996, 11/05/2007  . Zoster 11/18/2015  4. Prostate cancer screening- PSA followed at urology - q3 and 16 months planned repeat- luckily last test has gone back down.  Lab Results  Component Value Date   PSA 4.34 06/08/2016   PSA 6.05 (H) 10/29/2015   PSA 3.47 10/30/2014   5. Colon cancer screening - 09/2007 so will be due next year 6. Skin cancer screening- Dr. Renda Rolls yearly- also on doxycycline through them. advised regular sunscreen use. Denies worrisome, changing, or new skin lesions.   Status of chronic or acute concerns   Skip cbc and cmp as normal less than 2 months ago. Never smoker- no  urine test.   Parkinsons- still working with trainer MWF. On sinemet now 3x a day . Left foot drop and contracture left upper arm as well as tremor noted.   Some sciatic nerve pain- seeing chiropractor and helping him work through that- using cane short term. Getting better.   Heavy breathing with exertion. Doing much better- wonders if it was related to first parkinsons medication he was on  IBS- has been doing well - no recent issues  Anxiety state Improved 2018- not on zoloft  Hypotestosteronemia Low Testosterone- also on every other day clomid through urology in past- has been off for some time  Low serum vitamin D Low vitamin D- slightly low last check- treated with 50k 8 weeks  Hyperlipidemia HLD- 2 years ago- normalized last year- update lipids. Sip of coffee with milk this morning- doubt substantially will change lipids   Future Appointments  Date Time Provider [place]  12/01/2016  8:15 AM Tat, Eustace Quail, DO [facility] None   1 year physical or sooner if needed  Orders Placed This Encounter  Procedures  . Lipid panel    [place]    Order Specific Question:   Has the patient fasted?    Answer:   No  . VITAMIN D 25 Hydroxy (Vit-D Deficiency, Fractures)    [place]   Return precautions advised.  [name], MD

## 2016-11-11 ENCOUNTER — Telehealth: Payer: Self-pay

## 2016-11-11 NOTE — Telephone Encounter (Signed)
Spoke with patient who verbalized understanding of lab results 

## 2016-11-14 ENCOUNTER — Other Ambulatory Visit: Payer: Self-pay | Admitting: Neurology

## 2016-11-14 DIAGNOSIS — M5441 Lumbago with sciatica, right side: Secondary | ICD-10-CM | POA: Diagnosis not present

## 2016-11-14 DIAGNOSIS — M9903 Segmental and somatic dysfunction of lumbar region: Secondary | ICD-10-CM | POA: Diagnosis not present

## 2016-11-14 MED ORDER — CARBIDOPA-LEVODOPA 25-100 MG PO TABS: 1.0000 | ORAL_TABLET | Freq: Three times a day (TID) | ORAL | 0 refills | Status: DC

## 2016-11-15 DIAGNOSIS — M5441 Lumbago with sciatica, right side: Secondary | ICD-10-CM | POA: Diagnosis not present

## 2016-11-15 DIAGNOSIS — M9903 Segmental and somatic dysfunction of lumbar region: Secondary | ICD-10-CM | POA: Diagnosis not present

## 2016-11-29 NOTE — Progress Notes (Signed)
Dan [name] was seen today in the movement disorders clinic for neurologic consultation at the request of Marin Olp, MD.   The patient presents today for neurologic consultation for left foot drop.  The patient's symptoms started about one year ago when the patient noticed that he began to drag his left leg with walking, especially at work.  He states that the leg feels heavy and he has to remind himself to pick the foot up.  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  His friends noted tremor about a year ago, but he didn't notice it until about 6 months ago when he went to pick up a coffee cup and spilled some coffee.  His partner noted that he could no longer keep up with him when they would take walks.  04/23/15 update:  The patient is here today for six-month follow-up.  I have reviewed records made available to me.  He has been working out with a trainer Monday, Wednesday, and Friday at [facility]).  He has noted that the L arm wants to "pull inward."  He has recently been noticing more fatigue.  He had some labwork in that regard.  His TSH was normal.  His B12 was normal at 427.  His vitamin D was slightly low and he was recently started on supplementation.  These labs were done on 04/10/2015.  He has not had any falls since last visit.  No lightheadedness or near syncope.  No hallucinations.  He continues to work at [employer] on [employer] but he puts in 12,000 steps a day.  He notes L arm tremor and feels an inner tremor in the L but no outer tremor.  Sometimes L foot will catch on the ground.    07/16/15 update:  The patient presents today for follow-up.  Last visit, the patient was formally diagnosed with idiopathic Parkinson's disease.  I felt that he would benefit from a dopamine agonist, but he wanted to hold on that.   Since our last visit, the patient feels that he has overall been doing well.  He has not had any falls.  No hallucinations.  No lightheadedness or near syncope.  He is going to the [facility] 3 days a week.  Dx with Vit D deficiency and started on replacement and that helped with fatigue.  Saw dermatology (Dr. Renda Rolls) today and had skin examination  10/22/15 update:  The patient follows up today.  He has a history of Parkinson's disease.  He has wanted no medication for this.  He has noted increased tremor in the L arm.  He is exercising at the [facility] 3 days a week. He does state that he has had plantar fasciitis in the L foot.  He saw podiatry and had a cortisone injection and it is helping.  He has seen his PCP.  He has had labs and his B12, testosterone, vit D is normal.   He has not had any falls.  No hallucinations.  No lightheadedness or near syncope.  Mood is improved.  He attributes this to the fact that his mother has been on list for friends home [place] and she is moving in first week of November.    01/14/16 update:  Patient follows up for his  history of Parkinson's disease.  He is on no medication, which is by his choice.  He continues to exercise 3 days per week at the [facility] and states that he works on core and balance.  Still working full time.  Pt denies falls.  Pt denies lightheadedness unless really working out hard at the gym and no near syncope.  No hallucinations.  No longer having RBD.    Does not that L hand shakes most of the time but not interfering with ADLS's The records that were made available to me were reviewed.  Dr. Yong Channel started patient on zoloft in November and appreciate talking with him about that.  Pt states that after talking with Dr. Yong Channel about the feelings, they resolved and he feels much better.  04/14/16 update:  Patient is seen today in follow-up.  Since our last visit, he did seek a second opinion with Dr. Rexene Alberts.  I have reviewed her records.  She agreed with the  diagnosis.  She agreed with the working plan.  The patient continues to [facility] 3 times per week at [facility].  He changed working schedule so only working 4 days per week now.  He denies any falls.  More trouble getting in car with lifting left leg in the car.   He denies any lightheadedness or near syncope.  No hallucinations.  He continues to have left hand tremor and thinks that it has gotten worse.  ADL's are taking longer.  Taking nearly 2 hours to get ready in the AM.   No other new medical problems since our last visit.  Is ready to start medication.  Sleeping well.   08/31/16 update:  Patient seen today in follow-up for his Parkinson's disease.  He was started on pramipexole last visit and work to 0.5 mg 3 times per day.  He ended up stopping the medication in early July.  He felt that it worsened feeling of being "blue" (interestingly, this medication has been used for the treatment of depression in the past) and felt that it was causing morning headache, constipation and mild nausea.  Strangely, the headache would go away once awake (and medication is off at night).  He also did not think that was particularly helping his tremor.  Tremor is worse at work but better during the evening.  If he gets agitated, he will have no tremor.  Otherwise, he feels better off of the medication.  He does think that he needs to cut back more at work because he is pretty exhausted when he gets home and worries he will have an accident in the shower if he is too tired (he hasn't).  He took zoloft for 2 days and felt it didn't work and d/c it.  He did injure the L hip and occasionally it goes down the left leg.  He just did that a week ago.    11/30/16 update: Patient seen today in follow-up for his Parkinson's disease.  I have reviewed numerous records available to me since last visit.  We started him on carbidopa/levodopa 25/100, 1 tablet 3 times per day since last visit.  He initially thought that this was causing shortness  of breath and chest heaviness.  He went to his primary care physician who worked this up and did not find any etiology for this.  He is doing better in this regard.  He has no depression or nausea.  He changed his work schedule to 4 days a week but he is  exhausted at the end of the week so he may try to go to 3 days per week.  His sciatica is bothering him and he is going to the chiropractor but is using a cane.  He is getting somewhat better.  No falls.  Little more trouble getting in and out of the car because of PD.    PREVIOUS MEDICATIONS: Pramipexole (patient thought that it worsened depression and caused headache and nausea)  ALLERGIES:  No Known Allergies  CURRENT MEDICATIONS:  Outpatient Encounter Medications as of 12/01/2016  Medication Sig  . Ascorbic Acid (VITAMIN C) 1000 MG tablet Take 1,000 mg by mouth daily.  . B Complex Vitamins (VITAMIN B COMPLEX PO) Take by mouth.  . carbidopa-levodopa (SINEMET IR) 25-100 MG tablet Take 1 tablet 3 (three) times daily by mouth.  . cholecalciferol (VITAMIN D) 1000 units tablet Take 1,000 Units by mouth daily.  Marland Kitchen doxycycline (VIBRAMYCIN) 100 MG capsule Take 100 mg by mouth 2 (two) times daily.  Marland Kitchen FOLIC ACID PO Take 161 mg by mouth.   Marland Kitchen glucosamine-chondroitin 500-400 MG tablet Take 1 tablet by mouth 3 (three) times daily.  Marland Kitchen L-Lysine 500 MG TABS Take 500 mg by mouth daily.  . Multiple Vitamin (MULTIVITAMIN) tablet Take 1 tablet by mouth daily.  . Omega-3 Fatty Acids (FISH OIL) 1000 MG CAPS Take 1,000 mg by mouth daily.  . saw palmetto 160 MG capsule Take 160 mg by mouth daily.   No facility-administered encounter medications on file as of 12/01/2016.     PAST MEDICAL HISTORY:   Past Medical History:  Diagnosis Date  . Anxiety   . Bleeding hemorrhoid 05/24/2010  . Depression 08/11/2006   Anxiety as well around down market- tough time with job loss in [employer]    . Hyperlipidemia LDL goal < 130 05/24/2010  . Irritable bowel syndrome  08/29/2007   Improved with diet change      PAST SURGICAL HISTORY:   Past Surgical History:  Procedure Laterality Date  . APPENDECTOMY    . COLONOSCOPY  05-01-00  . HERNIA REPAIR    . ROOT CANAL    . TONSILLECTOMY  1958    SOCIAL HISTORY:   Social History   Socioeconomic History  . Marital status: Single    Spouse name: Not on file  . Number of children: Not on file  . Years of education: Not on file  . Highest education level: Not on file  Social Needs  . Financial resource strain: Not on file  . Food insecurity - worry: Not on file  . Food insecurity - inability: Not on file  . Transportation needs - medical: Not on file  . Transportation needs - non-medical: Not on file  Occupational History  . Occupation: Public relations account executive    Comment: works at [employer]  Tobacco Use  . Smoking status: Never Smoker  . Smokeless tobacco: Never Used  Substance and Sexual Activity  . Alcohol use: Yes    Alcohol/week: 0.0 oz    Comment: once a week  . Drug use: No  . Sexual activity: Not on file  Other Topics Concern  . Not on file  Social History Narrative   Lives alone. Long term partner - does not live with him.       Works at the [employer]    Formerly Designer, multimedia.    Goes to [employer]      Hobbies: Exercises regularly, relax with friends  FAMILY HISTORY:   Family Status  Relation Name Status  . Brother  Alive       HIV controlled  . Brother  Alive       HIV controlled  . Sister  Alive       healthy  . Mother  Alive       Breast cancer  . Father  Deceased       73, prostate cancer 10 years prior to death     ROS:  A complete 10 system review of systems was obtained and was unremarkable apart from what is mentioned above.  PHYSICAL EXAMINATION:    VITALS:   Vitals:   12/01/16 0759  BP: (!) 142/88  Pulse: 82  SpO2: 97%  Weight: 202 lb (91.6 kg)  Height: 6' 2.5" (1.892 m)   Wt  Readings from Last 3 Encounters:  12/01/16 202 lb (91.6 kg)  11/09/16 201 lb 9.6 oz (91.4 kg)  09/20/16 200 lb 12.8 oz (91.1 kg)     GEN:  The patient appears stated age and is in NAD. HEENT:  Normocephalic, atraumatic.  The mucous membranes are moist. The superficial temporal arteries are without ropiness or tenderness. CV:  RRR Lungs:  CTAB Neck/HEME:  There are no carotid bruits bilaterally.  Neurological examination:  Orientation: The patient is alert and oriented x3.  Cranial nerves: There is good facial symmetry.   Soft palate rises symmetrically and there is no tongue deviation. Hearing is intact to conversational tone. Sensation: Sensation is intact to light touch throughout Motor: Strength is 5/5 in the bilateral upper and lower extremities.   Shoulder shrug is equal and symmetric.  There is no pronator drift.  No foot drop noted   Movement examination: Tone: There is still mild increased tone in the LUE and mod in the LLE Abnormal movements: There is a near constant LUE tremor.   Coordination:  There is decremation with finger taps and toe taps on the L Gait and Station: The patient has mild difficulty arising out of a deep-seated chair without the use of the hands (due to hip pain on the right). The patient's stride length is normal with decreased arm swing on the L but there is the emergence of a resting tremor on the L with ambulation.  He drags the L leg due to hip pain and walk is a bit antalgic  ASSESSMENT/PLAN:  1.  Idiopathic Parkinsons disease  -Patient continues to exercise regularly with a trainer and encouraged him to continue to do so.  -we will continue carbidopa/levodopa 25/100 but told him to bring dosages closer together (currently taking last one at 8:30 pm) and will increase to 2 tablets in the AM and 1 in the afternoon and evening.  If doesn't help rigidity, will do on/off test   2.  Follow up is anticipated in the next few months, sooner should new  neurologic issues arise.  Much greater than 50% of this visit was spent in counseling and coordinating care.  Total face to face time:  25 min

## 2016-12-01 ENCOUNTER — Encounter: Payer: Self-pay | Admitting: Neurology

## 2016-12-01 ENCOUNTER — Ambulatory Visit: Payer: BLUE CROSS/BLUE SHIELD | Admitting: Neurology

## 2016-12-01 VITALS — BP 142/88 | HR 82 | Ht 74.5 in | Wt 202.0 lb

## 2016-12-01 DIAGNOSIS — G20 Parkinson's disease: Secondary | ICD-10-CM

## 2016-12-01 NOTE — Patient Instructions (Signed)
Increase carbidopa/levodopa 25/100 to 2 tablets at 6am, one tablet at 11 am, one tablet at 4 pm.  Let me know when you need a refill

## 2016-12-21 DIAGNOSIS — R972 Elevated prostate specific antigen [PSA]: Secondary | ICD-10-CM | POA: Diagnosis not present

## 2017-01-11 DIAGNOSIS — M4726 Other spondylosis with radiculopathy, lumbar region: Secondary | ICD-10-CM | POA: Diagnosis not present

## 2017-01-11 DIAGNOSIS — M5117 Intervertebral disc disorders with radiculopathy, lumbosacral region: Secondary | ICD-10-CM | POA: Diagnosis not present

## 2017-01-13 DIAGNOSIS — R972 Elevated prostate specific antigen [PSA]: Secondary | ICD-10-CM | POA: Diagnosis not present

## 2017-01-13 LAB — PSA: PSA: 5.62

## 2017-01-17 ENCOUNTER — Telehealth: Payer: Self-pay | Admitting: Neurology

## 2017-01-17 NOTE — Telephone Encounter (Signed)
Received MRI report of the lumbar spine completed on January 11, 2017.  Has me listed as authorizing provider.  Luvenia Starch, can you check on this as I do not see where I have ordered this but could be mistaken.  No films are present as [facility].  Stated there was a disc herniation at L5-S1 that displaces the left S1 nerve root and multilevel degenerative changes.

## 2017-01-17 NOTE — Telephone Encounter (Signed)
Spoke with [facility] who states they do not have Dr. Carles Collet as an authorizing provider. They state study ordered by Dr. Deatra Robinson, chiropractor. I did fax them a copy at [phone] to let them know it lists Dr. Carles Collet as authorizing provider on our copy. They will fix.

## 2017-01-18 DIAGNOSIS — E291 Testicular hypofunction: Secondary | ICD-10-CM | POA: Diagnosis not present

## 2017-01-18 DIAGNOSIS — R972 Elevated prostate specific antigen [PSA]: Secondary | ICD-10-CM | POA: Diagnosis not present

## 2017-01-18 DIAGNOSIS — N400 Enlarged prostate without lower urinary tract symptoms: Secondary | ICD-10-CM | POA: Diagnosis not present

## 2017-01-20 ENCOUNTER — Encounter: Payer: Self-pay | Admitting: Family Medicine

## 2017-01-20 DIAGNOSIS — Z8042 Family history of malignant neoplasm of prostate: Secondary | ICD-10-CM | POA: Diagnosis not present

## 2017-01-20 DIAGNOSIS — G20 Parkinson's disease: Secondary | ICD-10-CM | POA: Diagnosis not present

## 2017-01-20 DIAGNOSIS — R972 Elevated prostate specific antigen [PSA]: Secondary | ICD-10-CM | POA: Diagnosis not present

## 2017-02-11 ENCOUNTER — Other Ambulatory Visit: Payer: Self-pay | Admitting: Neurology

## 2017-03-01 DIAGNOSIS — G20 Parkinson's disease: Secondary | ICD-10-CM | POA: Diagnosis not present

## 2017-03-07 NOTE — Progress Notes (Signed)
Dan [name] was seen today in the movement disorders clinic for neurologic consultation at the request of Marin Olp, MD.   The patient presents today for neurologic consultation for left foot drop.  The patient's symptoms started about one year ago when the patient noticed that he began to drag his left leg with walking, especially at work.  He states that the leg feels heavy and he has to remind himself to pick the foot up.  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  His friends noted tremor about a year ago, but he didn't notice it until about 6 months ago when he went to pick up a coffee cup and spilled some coffee.  His partner noted that he could no longer keep up with him when they would take walks.  04/23/15 update:  The patient is here today for six-month follow-up.  I have reviewed records made available to me.  He has been working out with a trainer Monday, Wednesday, and Friday at [facility]).  He has noted that the L arm wants to "pull inward."  He has recently been noticing more fatigue.  He had some labwork in that regard.  His TSH was normal.  His B12 was normal at 427.  His vitamin D was slightly low and he was recently started on supplementation.  These labs were done on 04/10/2015.  He has not had any falls since last visit.  No lightheadedness or near syncope.  No hallucinations.  He continues to work at [employer] on [employer] but he puts in 12,000 steps a day.  He notes L arm tremor and feels an inner tremor in the L but no outer tremor.  Sometimes L foot will catch on the ground.    07/16/15 update:  The patient presents today for follow-up.  Last visit, the patient was formally diagnosed with idiopathic Parkinson's disease.  I felt that he would benefit from a dopamine agonist, but he wanted to hold on that.   Since our last visit, the patient feels that he has overall been doing well.  He has not had any falls.  No hallucinations.  No lightheadedness or near syncope.  He is going to the [facility] 3 days a week.  Dx with Vit D deficiency and started on replacement and that helped with fatigue.  Saw dermatology (Dr. Renda Rolls) today and had skin examination  10/22/15 update:  The patient follows up today.  He has a history of Parkinson's disease.  He has wanted no medication for this.  He has noted increased tremor in the L arm.  He is exercising at the [facility] 3 days a week. He does state that he has had plantar fasciitis in the L foot.  He saw podiatry and had a cortisone injection and it is helping.  He has seen his PCP.  He has had labs and his B12, testosterone, vit D is normal.   He has not had any falls.  No hallucinations.  No lightheadedness or near syncope.  Mood is improved.  He attributes this to the fact that his mother has been on list for friends home [place] and she is moving in first week of November.    01/14/16 update:  Patient follows up for his  history of Parkinson's disease.  He is on no medication, which is by his choice.  He continues to exercise 3 days per week at the [facility] and states that he works on core and balance.  Still working full time.  Pt denies falls.  Pt denies lightheadedness unless really working out hard at the gym and no near syncope.  No hallucinations.  No longer having RBD.    Does not that L hand shakes most of the time but not interfering with ADLS's The records that were made available to me were reviewed.  Dr. Yong Channel started patient on zoloft in November and appreciate talking with him about that.  Pt states that after talking with Dr. Yong Channel about the feelings, they resolved and he feels much better.  04/14/16 update:  Patient is seen today in follow-up.  Since our last visit, he did seek a second opinion with Dr. Rexene Alberts.  I have reviewed her records.  She agreed with the  diagnosis.  She agreed with the working plan.  The patient continues to exercise 3 times per week at [facility].  He changed working schedule so only working 4 days per week now.  He denies any falls.  More trouble getting in car with lifting left leg in the car.   He denies any lightheadedness or near syncope.  No hallucinations.  He continues to have left hand tremor and thinks that it has gotten worse.  ADL's are taking longer.  Taking nearly 2 hours to get ready in the AM.   No other new medical problems since our last visit.  Is ready to start medication.  Sleeping well.   08/31/16 update:  Patient seen today in follow-up for his Parkinson's disease.  He was started on pramipexole last visit and work to 0.5 mg 3 times per day.  He ended up stopping the medication in early July.  He felt that it worsened feeling of being "blue" (interestingly, this medication has been used for the treatment of depression in the past) and felt that it was causing morning headache, constipation and mild nausea.  Strangely, the headache would go away once awake (and medication is off at night).  He also did not think that was particularly helping his tremor.  Tremor is worse at work but better during the evening.  If he gets agitated, he will have no tremor.  Otherwise, he feels better off of the medication.  He does think that he needs to cut back more at work because he is pretty exhausted when he gets home and worries he will have an accident in the shower if he is too tired (he hasn't).  He took zoloft for 2 days and felt it didn't work and d/c it.  He did injure the L hip and occasionally it goes down the left leg.  He just did that a week ago.    11/30/16 update: Patient seen today in follow-up for his Parkinson's disease.  I have reviewed numerous records available to me since last visit.  We started him on carbidopa/levodopa 25/100, 1 tablet 3 times per day since last visit.  He initially thought that this was causing shortness  of breath and chest heaviness.  He went to his primary care physician who worked this up and did not find any etiology for this.  He is doing better in this regard.  He has no depression or nausea.  He changed his work schedule to 4 days a week but he is  exhausted at the end of the week so he may try to go to 3 days per week.  His sciatica is bothering him and he is going to the chiropractor but is using a cane.  He is getting somewhat better.  No falls.  Little more trouble getting in and out of the car because of PD.    03/09/17 update: Patient is seen today in follow-up for Parkinson's disease.  Last visit, we increased his carbidopa/levodopa 25/1 100-2 tablets in the morning, 1 in the afternoon and 1 in the evening.  He is doing that and feeling great.  He denies falls.  No lightheadedness.   He did seek a third opinion with Dr. Linus Mako on March 01, 2017.  I have reviewed those records.  No changes were made in his medications.  They mentioned that low-dose pramipexole could be initiated at 0.25 mg 3 times per day.  The patient has been on pramipexole in the past and he thought it worsened depression and caused headache and nausea.  They also discussed with him potentially starting Lexapro for mood.  He states that he is doing really good with mood as he is pain free.   He does state he is seeing chiropractics and he had an MRI lumbar spine which demonstrated LEFT disc herniation at L5-S1 but sx's were right sided.  I personally reviewed the MRI films.  He has not seen a physician.  He is no longer having pain down the right leg and feels really good in that regard.  "I'm better than normal" and feeling good.  PREVIOUS MEDICATIONS: Pramipexole (patient thought that it worsened depression and caused headache and nausea)  ALLERGIES:  No Known Allergies  CURRENT MEDICATIONS:  Outpatient Encounter Medications as of 03/09/2017  Medication Sig  . Ascorbic Acid (VITAMIN C) 1000 MG tablet Take 1,000 mg by  mouth daily.  . B Complex Vitamins (VITAMIN B COMPLEX PO) Take by mouth.  . carbidopa-levodopa (SINEMET IR) 25-100 MG tablet 2 tablets at 6am, one tablet at 11 am, one tablet at 4 pm  . cholecalciferol (VITAMIN D) 1000 units tablet Take 1,000 Units by mouth daily.  Marland Kitchen doxycycline (VIBRAMYCIN) 100 MG capsule Take 100 mg by mouth 2 (two) times daily.  Marland Kitchen FOLIC ACID PO Take 034 mg by mouth.   Marland Kitchen glucosamine-chondroitin 500-400 MG tablet Take 1 tablet by mouth 3 (three) times daily.  Marland Kitchen L-Lysine 500 MG TABS Take 500 mg by mouth daily.  . Multiple Vitamin (MULTIVITAMIN) tablet Take 1 tablet by mouth daily.  . Omega-3 Fatty Acids (FISH OIL) 1000 MG CAPS Take 1,000 mg by mouth daily.  . saw palmetto 160 MG capsule Take 160 mg by mouth daily.   No facility-administered encounter medications on file as of 03/09/2017.     PAST MEDICAL HISTORY:   Past Medical History:  Diagnosis Date  . Anxiety   . Bleeding hemorrhoid 05/24/2010  . Depression 08/11/2006   Anxiety as well around down market- tough time with job loss in [employer]    . Hyperlipidemia LDL goal < 130 05/24/2010  . Irritable bowel syndrome 08/29/2007   Improved with diet change      PAST SURGICAL HISTORY:   Past Surgical History:  Procedure Laterality Date  . APPENDECTOMY    . COLONOSCOPY  05-01-00  . HERNIA REPAIR    . ROOT CANAL    . TONSILLECTOMY  1958    SOCIAL HISTORY:   Social History   Socioeconomic History  .  Marital status: Single    Spouse name: Not on file  . Number of children: Not on file  . Years of education: Not on file  . Highest education level: Not on file  Social Needs  . Financial resource strain: Not on file  . Food insecurity - worry: Not on file  . Food insecurity - inability: Not on file  . Transportation needs - medical: Not on file  . Transportation needs - non-medical: Not on file  Occupational History  . Occupation: Public relations account executive    Comment: works at [employer]  Tobacco Use  .  Smoking status: Never Smoker  . Smokeless tobacco: Never Used  Substance and Sexual Activity  . Alcohol use: Yes    Alcohol/week: 0.0 oz    Comment: once a week  . Drug use: No  . Sexual activity: Not on file  Other Topics Concern  . Not on file  Social History Narrative   Lives alone. Long term partner - does not live with him.       Works at the [employer]    Formerly Designer, multimedia.    Goes to [employer]      Hobbies: Exercises regularly, relax with friends    FAMILY HISTORY:   Family Status  Relation Name Status  . Brother  Alive       HIV controlled  . Brother  Alive       HIV controlled  . Sister  Alive       healthy  . Mother  Alive       Breast cancer  . Father  Deceased       26, prostate cancer 10 years prior to death     ROS:  A complete 10 system review of systems was obtained and was unremarkable apart from what is mentioned above.  PHYSICAL EXAMINATION:    VITALS:   Vitals:   03/09/17 0802  BP: 124/72  Pulse: 82  SpO2: 98%  Weight: 205 lb (93 kg)  Height: 6' 2.5" (1.892 m)   Wt Readings from Last 3 Encounters:  03/09/17 205 lb (93 kg)  12/01/16 202 lb (91.6 kg)  11/09/16 201 lb 9.6 oz (91.4 kg)     GEN:  The patient appears stated age and is in NAD. HEENT:  Normocephalic, atraumatic.  The mucous membranes are moist. The superficial temporal arteries are without ropiness or tenderness. CV:  RRR Lungs:  CTAB Neck/HEME:  There are no carotid bruits bilaterally.  Neurological examination:  Orientation: The patient is alert and oriented x3.  Cranial nerves: There is good facial symmetry.   Soft palate rises symmetrically and there is no tongue deviation. Hearing is intact to conversational tone. Sensation: Sensation is intact to light touch throughout Motor: Strength is at least antigravity x 4   Movement examination: Tone: There is normal tone today Abnormal movements: There  is a mild LUE resting tremor. Coordination:  There is decremation with toe taps only on the L Gait and Station: The patient has no difficulty arising out of a deep-seated chair without the use of the hands.  He walks well down the hall with good arm swing  Labs:    Chemistry      Component Value Date/Time   NA 140 09/20/2016 1132   K 4.2 09/20/2016 1132   CL 102 09/20/2016 1132   CO2 31 09/20/2016 1132   BUN 16 09/20/2016 1132   CREATININE 0.85 09/20/2016  1132      Component Value Date/Time   CALCIUM 9.8 09/20/2016 1132   ALKPHOS 44 09/20/2016 1132   AST 16 09/20/2016 1132   ALT 9 09/20/2016 1132   BILITOT 0.6 09/20/2016 1132     Lab Results  Component Value Date   TSH 1.16 10/29/2015     ASSESSMENT/PLAN:  1.  Idiopathic Parkinsons disease  -Patient doing really well.  -he looks great today.  He will continue carbidopa/levodopa 25/100, 2/1/1.  He looks much better with extra addition of carbidopa/levodopa in the AM.    -next time has lab work, need TSH checked.  Will have done at PCP.   2.  Low back pain, right (lumbar radic)  -he is doing much better.   MRI didn't correlate well with side of sx's but he is pain free and feels well  3.  Follow up is anticipated in the next few months, sooner should new neurologic issues arise.  Much greater than 50% of this visit was spent in counseling and coordinating care.  Total face to face time:  25 min

## 2017-03-09 ENCOUNTER — Ambulatory Visit: Payer: BLUE CROSS/BLUE SHIELD | Admitting: Neurology

## 2017-03-09 ENCOUNTER — Encounter: Payer: Self-pay | Admitting: Neurology

## 2017-03-09 VITALS — BP 124/72 | HR 82 | Ht 74.5 in | Wt 205.0 lb

## 2017-03-09 DIAGNOSIS — G20 Parkinson's disease: Secondary | ICD-10-CM | POA: Diagnosis not present

## 2017-03-09 DIAGNOSIS — M5431 Sciatica, right side: Secondary | ICD-10-CM | POA: Diagnosis not present

## 2017-03-09 NOTE — Patient Instructions (Signed)
  [facility]'s [facility]  The [facility] team know that living well with a movement disorder extends far beyond our clinic walls. We are together with you. Our team is passionate about providing resources to you and your loved ones who are living with Parkinson's disease and movement disorders. Participate in these programs and join our community. These resources are free or low cost!   [facility] is adding:   Innovative educational programs for patients and caregivers.   Support groups for patients and caregivers living with Parkinson's disease.   Parkinson's specific exercise programs.   Custom tailored therapeutic programs that will benefit patient's living with Parkinson's disease.   We are in this together. You can help and contribute to grow these programs and resources in our community. 100% of the funds donated to the Movement Disorders Fund stays right here in our community to support patients and their caregivers.  To make a tax deductible contribution:  -ask for a [facility] Parkinson's envelope in the office today.  - call the Office of Institutional Advancement at [phone].         

## 2017-05-09 ENCOUNTER — Ambulatory Visit: Payer: BLUE CROSS/BLUE SHIELD | Admitting: Family Medicine

## 2017-05-09 ENCOUNTER — Other Ambulatory Visit: Payer: Self-pay | Admitting: Neurology

## 2017-05-09 ENCOUNTER — Encounter: Payer: Self-pay | Admitting: Family Medicine

## 2017-05-09 VITALS — BP 122/78 | HR 71 | Temp 98.0°F | Ht 74.5 in | Wt 201.6 lb

## 2017-05-09 DIAGNOSIS — F411 Generalized anxiety disorder: Secondary | ICD-10-CM | POA: Diagnosis not present

## 2017-05-09 DIAGNOSIS — R972 Elevated prostate specific antigen [PSA]: Secondary | ICD-10-CM

## 2017-05-09 DIAGNOSIS — E785 Hyperlipidemia, unspecified: Secondary | ICD-10-CM | POA: Diagnosis not present

## 2017-05-09 DIAGNOSIS — G20 Parkinson's disease: Secondary | ICD-10-CM | POA: Diagnosis not present

## 2017-05-09 LAB — TSH: TSH: 0.99 u[IU]/mL (ref 0.35–4.50)

## 2017-05-09 LAB — PSA: PSA: 4.64 ng/mL — AB (ref 0.10–4.00)

## 2017-05-09 NOTE — Progress Notes (Signed)
Subjective:  Dan [name] is a 62 y.o. year old very pleasant male patient who presents for/with See problem oriented charting ROS- No chest pain or shortness of breath. No headache or blurry vision.     Past Medical History-  Patient Active Problem List   Diagnosis Date Noted  . Hypotestosteronemia 11/05/2015    Priority: High  . Elevated PSA 10/30/2014    Priority: High  . Parkinsonism ([place]) 10/30/2014    Priority: High  . Low serum vitamin D 04/16/2015    Priority: Medium  . Hyperlipidemia 05/24/2010    Priority: Medium  . Anxiety state 08/11/2006    Priority: Medium  . Bleeding hemorrhoid 05/24/2010    Priority: Low  . Irritable bowel syndrome 08/29/2007    Priority: Low    Medications- reviewed and updated Current Outpatient Medications  Medication Sig Dispense Refill  . Ascorbic Acid (VITAMIN C) 1000 MG tablet Take 1,000 mg by mouth daily.    . B Complex Vitamins (VITAMIN B COMPLEX PO) Take by mouth.    . carbidopa-levodopa (SINEMET IR) 25-100 MG tablet 2 tablets at 6am, one tablet at 11 am, one tablet at 4 pm 360 tablet 0  . cholecalciferol (VITAMIN D) 1000 units tablet Take 1,000 Units by mouth daily.    Marland Kitchen FOLIC ACID PO Take 165 mg by mouth.     Marland Kitchen glucosamine-chondroitin 500-400 MG tablet Take 1 tablet by mouth 3 (three) times daily.    Marland Kitchen L-Lysine 500 MG TABS Take 500 mg by mouth daily.    . Multiple Vitamin (MULTIVITAMIN) tablet Take 1 tablet by mouth daily.    . Omega-3 Fatty Acids (FISH OIL) 1000 MG CAPS Take 1,000 mg by mouth daily.    . saw palmetto 160 MG capsule Take 160 mg by mouth daily.     No current facility-administered medications for this visit.     Objective: BP 122/78 (BP Location: Left Arm, Patient Position: Sitting, Cuff Size: Normal)   Pulse 71   Temp 98 F (36.7 C) (Oral)   Ht 6' 2.5" (1.892 m)   Wt 201 lb 9.6 oz (91.4 kg)   SpO2 95%   BMI 25.54 kg/m  Gen: NAD, resting comfortably CV: RRR no murmurs rubs or gallops Lungs: CTAB no  crackles, wheeze, rhonchi Abdomen: soft/nontender/nondistended/normal bowel sounds.   Ext: no edema Skin: warm, dry Neuro: Resting tremor noted.  Minimal contracture left arm  Assessment/Plan:  Hyperlipidemia S: mild poorly controlled on no statin. Last visit we focused on healthy eating and regular exercise.  10 year risk still under 9% on last check Lab Results  Component Value Date   CHOL 180 11/09/2016   HDL 44.30 11/09/2016   LDLCALC 108 (H) 11/09/2016   TRIG 139.0 11/09/2016   CHOLHDL 4 11/09/2016   A/P: continue regular exercise, healthy eating efforts- hoping his LDL back under 100 as had been in 2017 but will wait until CPE  Parkinsonism (Dan [name]) S: For his Parkinson's he remains on Sinemet 3 times a day.  Continues to suffer with left foot drop and contracture left upper arm.  Tremor noted.  He is still working with trainer Monday Wednesday Friday. A/P: continue follow up with Dr. Carles [name]. I told him he only needs to see one movement disorder specialist- he says he had some people in his life pressure him to see Dan [name]- he will consider seeing Dr. Carles [name] only- would help him cut down on visits and she is already providing excellent care to patient.   -  Dr. Carles [name] requesting updated TSH so we will [name] that with bloodwork  Anxiety state S: anxiety has heightened as mother with ovarian cancer beginning of stage III. Just found out last Tuesday. Having to go back to same hospital where dad was treated and that is hard for him.  A/P: GAD7 elevated but this is situational. Continue without medication or counseling for now- he declines both.   Elevated PSA now seeing Dan [name] instead of [facility]. He wants to avoid biopsy- repeat PSA is due now per Dan [name] notes . Plan had been MRI but insurance didn't cover- may end up having to have biopsy Lab Results  Component Value Date   PSA 5.62 01/13/2017   PSA 4.34 06/08/2016   PSA 6.05 (H) 10/29/2015     Future Appointments  Date  Time Provider [place]  08/17/2017  8:15 AM Dan [name] [facility] None  11/10/2017  8:30 AM Dan [name] [facility]  For physical   Lab/Order associations: Elevated PSA - Plan: PSA  Hyperlipidemia, unspecified hyperlipidemia type - Plan: TSH  Parkinson's disease ([place]) - Plan: TSH  Return precautions advised.  Dan Reddish, [name]

## 2017-05-09 NOTE — Assessment & Plan Note (Addendum)
S: For his Parkinson's he remains on Sinemet 3 times a day.  Continues to suffer with left foot drop and contracture left upper arm.  Tremor noted.  He is still working with trainer Monday Wednesday Friday. A/P: continue follow up with Dr. Carles Collet. I told him he only needs to see one movement disorder specialist- he says he had some people in his life pressure him to see [place]- he will consider seeing Dr. Carles Collet only- would help him cut down on visits and she is already providing excellent care to patient.   - Dr. Carles Collet requesting updated TSH so we will do that with bloodwork

## 2017-05-09 NOTE — Progress Notes (Signed)
PSA has come down slightly.  Can we please send this to Dr. Rosana Hoes with [facility] urology?  Thyroid is normal.  I am sending this to Dr. Carles Collet who requested the test.

## 2017-05-09 NOTE — Assessment & Plan Note (Signed)
now seeing Dr. Rosana Hoes instead of [facility]. He wants to avoid biopsy- repeat PSA is due now per Dr. Rosana Hoes notes . Plan had been MRI but insurance didn't cover- may end up having to have biopsy Lab Results  Component Value Date   PSA 5.62 01/13/2017   PSA 4.34 06/08/2016   PSA 6.05 (H) 10/29/2015

## 2017-05-09 NOTE — Assessment & Plan Note (Signed)
S: anxiety has heightened as mother with ovarian cancer beginning of stage III. Just found out last Tuesday. Having to go back to same hospital where dad was treated and that is hard for him.  A/P: GAD7 elevated but this is situational. Continue without medication or counseling for now- he declines both.

## 2017-05-09 NOTE — Assessment & Plan Note (Signed)
S: mild poorly controlled on no statin. Last visit we focused on healthy eating and regular exercise.  10 year risk still under 9% on last check Lab Results  Component Value Date   CHOL 180 11/09/2016   HDL 44.30 11/09/2016   LDLCALC 108 (H) 11/09/2016   TRIG 139.0 11/09/2016   CHOLHDL 4 11/09/2016   A/P: continue regular exercise, healthy eating efforts- hoping his LDL back under 100 as had been in 2017 but will wait until CPE

## 2017-05-09 NOTE — Patient Instructions (Addendum)
Please stop by lab before you go   If I do not mention in your MyChart results that I am forwarding results to Dr. Rosana Hoes please send [place] a message back as the plan is to forward the results to him.  Let us know if you change your mind about medication or counseling for the anxiety you are dealing with.  I am so sorry about the diagnosis for your mom but it seems like both you and her are in a very good space mentally to approach this.  Keep up the great job with exercise.  Come fasting next visit.

## 2017-05-10 ENCOUNTER — Telehealth: Payer: Self-pay

## 2017-05-10 ENCOUNTER — Telehealth: Payer: Self-pay | Admitting: Family Medicine

## 2017-05-10 NOTE — Telephone Encounter (Signed)
Pt. was being given lab results per phone.  He asked for a recommendation by Dr. Yong Channel on a Podiatrist.  Reported he has seen Dr. Rosemary Holms for a left foot neuroma, and feels like he is "walking on a bunched up sock."  He does not feel that her treatment has helped.  Advised will send message to Dr. Yong Channel.

## 2017-05-10 NOTE — Telephone Encounter (Signed)
Called patient and left a message to call office back.  

## 2017-05-11 NOTE — Telephone Encounter (Signed)
[email or web address]  Anyone from this group. Ive heard a lot of good feedback about Dr. Paulla Dolly

## 2017-05-11 NOTE — Telephone Encounter (Signed)
Please advise 

## 2017-05-15 NOTE — Telephone Encounter (Signed)
Not sure why it this came to me.

## 2017-05-18 NOTE — Telephone Encounter (Signed)
Called patient and left a detailed message asking for a return phone call. I did provide information as provided by Dr. Yong Channel.

## 2017-06-21 DIAGNOSIS — M530 Cervicocranial syndrome: Secondary | ICD-10-CM | POA: Diagnosis not present

## 2017-06-21 DIAGNOSIS — M9903 Segmental and somatic dysfunction of lumbar region: Secondary | ICD-10-CM | POA: Diagnosis not present

## 2017-06-21 DIAGNOSIS — M9902 Segmental and somatic dysfunction of thoracic region: Secondary | ICD-10-CM | POA: Diagnosis not present

## 2017-06-21 DIAGNOSIS — M9901 Segmental and somatic dysfunction of cervical region: Secondary | ICD-10-CM | POA: Diagnosis not present

## 2017-06-22 DIAGNOSIS — M530 Cervicocranial syndrome: Secondary | ICD-10-CM | POA: Diagnosis not present

## 2017-06-22 DIAGNOSIS — M9901 Segmental and somatic dysfunction of cervical region: Secondary | ICD-10-CM | POA: Diagnosis not present

## 2017-06-22 DIAGNOSIS — M9902 Segmental and somatic dysfunction of thoracic region: Secondary | ICD-10-CM | POA: Diagnosis not present

## 2017-06-22 DIAGNOSIS — M9903 Segmental and somatic dysfunction of lumbar region: Secondary | ICD-10-CM | POA: Diagnosis not present

## 2017-06-26 DIAGNOSIS — M9903 Segmental and somatic dysfunction of lumbar region: Secondary | ICD-10-CM | POA: Diagnosis not present

## 2017-06-26 DIAGNOSIS — M530 Cervicocranial syndrome: Secondary | ICD-10-CM | POA: Diagnosis not present

## 2017-06-26 DIAGNOSIS — M9902 Segmental and somatic dysfunction of thoracic region: Secondary | ICD-10-CM | POA: Diagnosis not present

## 2017-06-26 DIAGNOSIS — M9901 Segmental and somatic dysfunction of cervical region: Secondary | ICD-10-CM | POA: Diagnosis not present

## 2017-06-27 DIAGNOSIS — M9901 Segmental and somatic dysfunction of cervical region: Secondary | ICD-10-CM | POA: Diagnosis not present

## 2017-06-27 DIAGNOSIS — M9902 Segmental and somatic dysfunction of thoracic region: Secondary | ICD-10-CM | POA: Diagnosis not present

## 2017-06-27 DIAGNOSIS — M9903 Segmental and somatic dysfunction of lumbar region: Secondary | ICD-10-CM | POA: Diagnosis not present

## 2017-06-27 DIAGNOSIS — M530 Cervicocranial syndrome: Secondary | ICD-10-CM | POA: Diagnosis not present

## 2017-06-28 DIAGNOSIS — M9901 Segmental and somatic dysfunction of cervical region: Secondary | ICD-10-CM | POA: Diagnosis not present

## 2017-06-28 DIAGNOSIS — M9903 Segmental and somatic dysfunction of lumbar region: Secondary | ICD-10-CM | POA: Diagnosis not present

## 2017-06-28 DIAGNOSIS — M530 Cervicocranial syndrome: Secondary | ICD-10-CM | POA: Diagnosis not present

## 2017-06-28 DIAGNOSIS — M9902 Segmental and somatic dysfunction of thoracic region: Secondary | ICD-10-CM | POA: Diagnosis not present

## 2017-06-29 DIAGNOSIS — M9903 Segmental and somatic dysfunction of lumbar region: Secondary | ICD-10-CM | POA: Diagnosis not present

## 2017-06-29 DIAGNOSIS — M530 Cervicocranial syndrome: Secondary | ICD-10-CM | POA: Diagnosis not present

## 2017-06-29 DIAGNOSIS — M9902 Segmental and somatic dysfunction of thoracic region: Secondary | ICD-10-CM | POA: Diagnosis not present

## 2017-06-29 DIAGNOSIS — M9901 Segmental and somatic dysfunction of cervical region: Secondary | ICD-10-CM | POA: Diagnosis not present

## 2017-07-10 DIAGNOSIS — M9903 Segmental and somatic dysfunction of lumbar region: Secondary | ICD-10-CM | POA: Diagnosis not present

## 2017-07-10 DIAGNOSIS — M530 Cervicocranial syndrome: Secondary | ICD-10-CM | POA: Diagnosis not present

## 2017-07-10 DIAGNOSIS — M9902 Segmental and somatic dysfunction of thoracic region: Secondary | ICD-10-CM | POA: Diagnosis not present

## 2017-07-10 DIAGNOSIS — M9901 Segmental and somatic dysfunction of cervical region: Secondary | ICD-10-CM | POA: Diagnosis not present

## 2017-07-12 DIAGNOSIS — M530 Cervicocranial syndrome: Secondary | ICD-10-CM | POA: Diagnosis not present

## 2017-07-12 DIAGNOSIS — M9902 Segmental and somatic dysfunction of thoracic region: Secondary | ICD-10-CM | POA: Diagnosis not present

## 2017-07-12 DIAGNOSIS — M9903 Segmental and somatic dysfunction of lumbar region: Secondary | ICD-10-CM | POA: Diagnosis not present

## 2017-07-12 DIAGNOSIS — M9901 Segmental and somatic dysfunction of cervical region: Secondary | ICD-10-CM | POA: Diagnosis not present

## 2017-07-17 DIAGNOSIS — M9902 Segmental and somatic dysfunction of thoracic region: Secondary | ICD-10-CM | POA: Diagnosis not present

## 2017-07-17 DIAGNOSIS — M9901 Segmental and somatic dysfunction of cervical region: Secondary | ICD-10-CM | POA: Diagnosis not present

## 2017-07-17 DIAGNOSIS — M9903 Segmental and somatic dysfunction of lumbar region: Secondary | ICD-10-CM | POA: Diagnosis not present

## 2017-07-17 DIAGNOSIS — M530 Cervicocranial syndrome: Secondary | ICD-10-CM | POA: Diagnosis not present

## 2017-07-18 DIAGNOSIS — R972 Elevated prostate specific antigen [PSA]: Secondary | ICD-10-CM | POA: Diagnosis not present

## 2017-07-21 DIAGNOSIS — R972 Elevated prostate specific antigen [PSA]: Secondary | ICD-10-CM | POA: Diagnosis not present

## 2017-07-21 DIAGNOSIS — G20 Parkinson's disease: Secondary | ICD-10-CM | POA: Diagnosis not present

## 2017-07-21 DIAGNOSIS — Z8042 Family history of malignant neoplasm of prostate: Secondary | ICD-10-CM | POA: Diagnosis not present

## 2017-07-26 DIAGNOSIS — M9903 Segmental and somatic dysfunction of lumbar region: Secondary | ICD-10-CM | POA: Diagnosis not present

## 2017-07-26 DIAGNOSIS — M9901 Segmental and somatic dysfunction of cervical region: Secondary | ICD-10-CM | POA: Diagnosis not present

## 2017-07-26 DIAGNOSIS — M530 Cervicocranial syndrome: Secondary | ICD-10-CM | POA: Diagnosis not present

## 2017-07-26 DIAGNOSIS — M9902 Segmental and somatic dysfunction of thoracic region: Secondary | ICD-10-CM | POA: Diagnosis not present

## 2017-08-02 DIAGNOSIS — M9903 Segmental and somatic dysfunction of lumbar region: Secondary | ICD-10-CM | POA: Diagnosis not present

## 2017-08-02 DIAGNOSIS — M530 Cervicocranial syndrome: Secondary | ICD-10-CM | POA: Diagnosis not present

## 2017-08-02 DIAGNOSIS — M9901 Segmental and somatic dysfunction of cervical region: Secondary | ICD-10-CM | POA: Diagnosis not present

## 2017-08-02 DIAGNOSIS — M9902 Segmental and somatic dysfunction of thoracic region: Secondary | ICD-10-CM | POA: Diagnosis not present

## 2017-08-15 NOTE — Progress Notes (Signed)
Dan [name] was seen today in the movement disorders clinic for neurologic consultation at the request of Marin Olp, MD.   The patient presents today for neurologic consultation for left foot drop.  The patient's symptoms started about one year ago when the patient noticed that he began to drag his left leg with walking, especially at work.  He states that the leg feels heavy and he has to remind himself to pick the foot up.  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  His friends noted tremor about a year ago, but he didn't notice it until about 6 months ago when he went to pick up a coffee cup and spilled some coffee.  His partner noted that he could no longer keep up with him when they would take walks.  04/23/15 update:  The patient is here today for six-month follow-up.  I have reviewed records made available to me.  He has been working out with a trainer Monday, Wednesday, and Friday at [facility]).  He has noted that the L arm wants to "pull inward."  He has recently been noticing more fatigue.  He had some labwork in that regard.  His TSH was normal.  His B12 was normal at 427.  His vitamin D was slightly low and he was recently started on supplementation.  These labs were done on 04/10/2015.  He has not had any falls since last visit.  No lightheadedness or near syncope.  No hallucinations.  He continues to work at [employer] on [employer] but he puts in 12,000 steps a day.  He notes L arm tremor and feels an inner tremor in the L but no outer tremor.  Sometimes L foot will catch on the ground.    07/16/15 update:  The patient presents today for follow-up.  Last visit, the patient was formally diagnosed with idiopathic Parkinson's disease.  I felt that he would benefit from a dopamine agonist, but he wanted to hold on that.   Since our last visit, the patient feels that he has overall been doing well.  He has not had any falls.  No hallucinations.  No lightheadedness or near syncope.  He is going to the [facility] 3 days a week.  Dx with Vit D deficiency and started on replacement and that helped with fatigue.  Saw dermatology (Dr. Renda Rolls) today and had skin examination  10/22/15 update:  The patient follows up today.  He has a history of Parkinson's disease.  He has wanted no medication for this.  He has noted increased tremor in the L arm.  He is exercising at the [facility] gym 3 days a week. He does state that he has had plantar fasciitis in the L foot.  He saw podiatry and had a cortisone injection and it is helping.  He has seen his PCP.  He has had labs and his B12, testosterone, vit D is normal.   He has not had any falls.  No hallucinations.  No lightheadedness or near syncope.  Mood is improved.  He attributes this to the fact that his mother has been on list for friends home [place] and she is moving in first week of November.    01/14/16 update:  Patient follows up for his  history of Parkinson's disease.  He is on no medication, which is by his choice.  He continues to exercise 3 days per week at the [facility] and states that he works on core and balance.  Still working full time.  Pt denies falls.  Pt denies lightheadedness unless really working out hard at the gym and no near syncope.  No hallucinations.  No longer having RBD.    Does not that L hand shakes most of the time but not interfering with ADLS's The records that were made available to me were reviewed.  Dr. Yong Channel started patient on zoloft in November and appreciate talking with him about that.  Pt states that after talking with Dr. Yong Channel about the feelings, they resolved and he feels much better.  04/14/16 update:  Patient is seen today in follow-up.  Since our last visit, he did seek a second opinion with Dr. Rexene Alberts.  I have reviewed her records.  She agreed with the  diagnosis.  She agreed with the working plan.  The patient continues to exercise 3 times per week at [facility].  He changed working schedule so only working 4 days per week now.  He denies any falls.  More trouble getting in car with lifting left leg in the car.   He denies any lightheadedness or near syncope.  No hallucinations.  He continues to have left hand tremor and thinks that it has gotten worse.  ADL's are taking longer.  Taking nearly 2 hours to get ready in the AM.   No other new medical problems since our last visit.  Is ready to start medication.  Sleeping well.   08/31/16 update:  Patient seen today in follow-up for his Parkinson's disease.  He was started on pramipexole last visit and work to 0.5 mg 3 times per day.  He ended up stopping the medication in early July.  He felt that it worsened feeling of being "blue" (interestingly, this medication has been used for the treatment of depression in the past) and felt that it was causing morning headache, constipation and mild nausea.  Strangely, the headache would go away once awake (and medication is off at night).  He also did not think that was particularly helping his tremor.  Tremor is worse at work but better during the evening.  If he gets agitated, he will have no tremor.  Otherwise, he feels better off of the medication.  He does think that he needs to cut back more at work because he is pretty exhausted when he gets home and worries he will have an accident in the shower if he is too tired (he hasn't).  He took zoloft for 2 days and felt it didn't work and d/c it.  He did injure the L hip and occasionally it goes down the left leg.  He just did that a week ago.    11/30/16 update: Patient seen today in follow-up for his Parkinson's disease.  I have reviewed numerous records available to me since last visit.  We started him on carbidopa/levodopa 25/100, 1 tablet 3 times per day since last visit.  He initially thought that this was causing shortness  of breath and chest heaviness.  He went to his primary care physician who worked this up and did not find any etiology for this.  He is doing better in this regard.  He has no depression or nausea.  He changed his work schedule to 4 days a week but he is  exhausted at the end of the week so he may try to go to 3 days per week.  His sciatica is bothering him and he is going to the chiropractor but is using a cane.  He is getting somewhat better.  No falls.  Little more trouble getting in and out of the car because of PD.    03/09/17 update: Patient is seen today in follow-up for Parkinson's disease.  Last visit, we increased his carbidopa/levodopa 25/1 100-2 tablets in the morning, 1 in the afternoon and 1 in the evening.  He is doing that and feeling great.  He denies falls.  No lightheadedness.   He did seek a third opinion with Dr. Linus Mako on March 01, 2017.  I have reviewed those records.  No changes were made in his medications.  They mentioned that low-dose pramipexole could be initiated at 0.25 mg 3 times per day.  The patient has been on pramipexole in the past and he thought it worsened depression and caused headache and nausea.  They also discussed with him potentially starting Lexapro for mood.  He states that he is doing really good with mood as he is pain free.   He does state he is seeing chiropractics and he had an MRI lumbar spine which demonstrated LEFT disc herniation at L5-S1 but sx's were right sided.  I personally reviewed the MRI films.  He has not seen a physician.  He is no longer having pain down the right leg and feels really good in that regard.  "I'm better than normal" and feeling good.  08/17/17 update: Patient is seen today in follow-up for Parkinson's disease.  He is on carbidopa/levodopa 25/100, 2 tablets in the morning, 1 in the afternoon and 1 in the evening.  He has tried to take 2 tablets in the middle of the day and may feel better.  "I've had no issues related to my  Parkinsons."  Pt denies falls.  Pt denies lightheadedness, near syncope.  No hallucinations.  Mood has been good.  Records are reviewed since last visit.  The patient saw urology on July 21, 2017 for elevated PSA.  They are just following the PSA at this point in time.  "My biggest issue is my sciatica."  Reports today that symptoms are in the L leg (previously right).  States that he is seeing chiropractics for this and working with [facility] with stretching and balance.  Mom dx with ovarian CA and has been through 4 chemo's and is doing well.  Noting vivid dreams but not acting them out.  Finds dreams are pleasant.  PREVIOUS MEDICATIONS: Pramipexole (patient thought that it worsened depression and caused headache and nausea)  ALLERGIES:  No Known Allergies  CURRENT MEDICATIONS:  Outpatient Encounter Medications as of 08/17/2017  Medication Sig  . Ascorbic Acid (VITAMIN C) 1000 MG tablet Take 1,000 mg by mouth daily.  . B Complex Vitamins (VITAMIN B COMPLEX PO) Take by mouth.  . carbidopa-levodopa (SINEMET IR) 25-100 MG tablet 2 TABLETS AT 6AM, ONE TABLET AT 11 AM, ONE TABLET AT 4 PM  . cholecalciferol (VITAMIN D) 1000 units tablet Take 1,000 Units by mouth daily.  Marland Kitchen FOLIC ACID PO Take 326 mg by mouth.   Marland Kitchen glucosamine-chondroitin 500-400 MG tablet Take 1 tablet by mouth 3 (three) times daily.  Marland Kitchen L-Lysine 500 MG TABS Take 500 mg by mouth daily.  . Multiple Vitamin (MULTIVITAMIN) tablet Take 1 tablet by mouth daily.  . Omega-3 Fatty Acids (FISH OIL)  1000 MG CAPS Take 1,000 mg by mouth daily.  . saw palmetto 160 MG capsule Take 160 mg by mouth daily.   No facility-administered encounter medications on file as of 08/17/2017.     PAST MEDICAL HISTORY:   Past Medical History:  Diagnosis Date  . Anxiety   . Bleeding hemorrhoid 05/24/2010  . Depression 08/11/2006   Anxiety as well around down market- tough time with job loss in [employer]    . Hyperlipidemia LDL goal < 130 05/24/2010  .  Irritable bowel syndrome 08/29/2007   Improved with diet change      PAST SURGICAL HISTORY:   Past Surgical History:  Procedure Laterality Date  . APPENDECTOMY    . COLONOSCOPY  05-01-00  . HERNIA REPAIR    . ROOT CANAL    . TONSILLECTOMY  1958    SOCIAL HISTORY:   Social History   Socioeconomic History  . Marital status: Single    Spouse name: Not on file  . Number of children: Not on file  . Years of education: Not on file  . Highest education level: Not on file  Occupational History  . Occupation: Public relations account executive    Comment: works at [employer]  . Financial resource strain: Not on file  . Food insecurity:    Worry: Not on file    Inability: Not on file  . Transportation needs:    Medical: Not on file    Non-medical: Not on file  Tobacco Use  . Smoking status: Never Smoker  . Smokeless tobacco: Never Used  Substance and Sexual Activity  . Alcohol use: Yes    Alcohol/week: 0.0 standard drinks    Comment: once a week  . Drug use: No  . Sexual activity: Not on file  Lifestyle  . Physical activity:    Days per week: Not on file    Minutes per session: Not on file  . Stress: Not on file  Relationships  . Social connections:    Talks on phone: Not on file    Gets together: Not on file    Attends religious service: Not on file    Active member of club or organization: Not on file    Attends meetings of clubs or organizations: Not on file    Relationship status: Not on file  . Intimate partner violence:    Fear of current or ex partner: Not on file    Emotionally abused: Not on file    Physically abused: Not on file    Forced sexual activity: Not on file  Other Topics Concern  . Not on file  Social History Narrative   Lives alone. Long term partner - does not live with him.       Works at the [employer]    Formerly Designer, multimedia.    Goes to [employer]      Hobbies: Exercises  regularly, relax with friends    FAMILY HISTORY:   Family Status  Relation Name Status  . Brother  Alive       HIV controlled  . Brother  Alive       HIV controlled  . Sister  Alive       healthy  . Mother  Alive       Breast cancer  . Father  Deceased       77, prostate cancer 10 years prior to death     ROS:  Review of Systems  Constitutional: Negative.   HENT: Negative.   Eyes: Negative.   Respiratory: Negative.   Cardiovascular: Negative.   Gastrointestinal: Negative.   Genitourinary: Negative.   Musculoskeletal: Positive for back pain.  Skin: Negative.      PHYSICAL EXAMINATION:    VITALS:   There were no vitals filed for this visit. Wt Readings from Last 3 Encounters:  05/09/17 201 lb 9.6 oz (91.4 kg)  03/09/17 205 lb (93 kg)  12/01/16 202 lb (91.6 kg)    GEN:  The patient appears stated age and is in NAD. HEENT:  Normocephalic, atraumatic.  The mucous membranes are moist. The superficial temporal arteries are without ropiness or tenderness. CV:  RRR Lungs:  CTAB Neck/HEME:  There are no carotid bruits bilaterally.  Neurological examination:  Orientation: The patient is alert and oriented x3. Cranial nerves: There is good facial symmetry. The speech is fluent and clear. Soft palate rises symmetrically and there is no tongue deviation. Hearing is intact to conversational tone. Sensation: Sensation is intact to light touch throughout Motor: Strength is 5/5 in the bilateral upper and lower extremities.   Shoulder shrug is equal and symmetric.  There is no pronator drift.   Movement examination: Tone: There is normal tone in the UE/LE Abnormal movements: There is a mild LUE resting tremor.  He is generally tremulous.  He has mild postural/intention tremor.   Coordination:  There is decremation with toe taps only on the L Gait and Station: The patient has no difficulty arising out of a deep-seated chair without the use of the hands.  He walks well down the  hall with good arm swing.  He also runs down the hall and has no issue  Labs:    Chemistry      Component Value Date/Time   NA 140 09/20/2016 1132   K 4.2 09/20/2016 1132   CL 102 09/20/2016 1132   CO2 31 09/20/2016 1132   BUN 16 09/20/2016 1132   CREATININE 0.85 09/20/2016 1132      Component Value Date/Time   CALCIUM 9.8 09/20/2016 1132   ALKPHOS 44 09/20/2016 1132   AST 16 09/20/2016 1132   ALT 9 09/20/2016 1132   BILITOT 0.6 09/20/2016 1132     Lab Results  Component Value Date   TSH 0.99 05/09/2017     ASSESSMENT/PLAN:  1.  Idiopathic Parkinsons disease  -doing well  -talked about getting dermatology visit.  He was seeing dermatology specialists but he didn't like it there.  Talked to him about calling [facility] for appt.  Discussed increased risk of melanoma with PD  -continue carbidopa/levodopa, 2/1/1.  Can take extra if needed  -invited PARTS   2.  Low back pain, right (lumbar radic)  -he is seeing chiropractor.  Initially had R sided sx's but now L sided.  3.  Follow up is anticipated in the next few months, sooner should new neurologic issues arise.  Much greater than 50% of this visit was spent in counseling and coordinating care.  Total face to face time:  25 min

## 2017-08-16 DIAGNOSIS — M9902 Segmental and somatic dysfunction of thoracic region: Secondary | ICD-10-CM | POA: Diagnosis not present

## 2017-08-16 DIAGNOSIS — M530 Cervicocranial syndrome: Secondary | ICD-10-CM | POA: Diagnosis not present

## 2017-08-16 DIAGNOSIS — M9901 Segmental and somatic dysfunction of cervical region: Secondary | ICD-10-CM | POA: Diagnosis not present

## 2017-08-16 DIAGNOSIS — M9903 Segmental and somatic dysfunction of lumbar region: Secondary | ICD-10-CM | POA: Diagnosis not present

## 2017-08-17 ENCOUNTER — Ambulatory Visit: Payer: BLUE CROSS/BLUE SHIELD | Admitting: Neurology

## 2017-08-17 ENCOUNTER — Encounter: Payer: Self-pay | Admitting: Neurology

## 2017-08-17 VITALS — BP 120/80 | HR 78 | Ht 74.5 in | Wt 201.2 lb

## 2017-08-17 DIAGNOSIS — G20 Parkinson's disease: Secondary | ICD-10-CM

## 2017-08-17 MED ORDER — CARBIDOPA-LEVODOPA 25-100 MG PO TABS: ORAL_TABLET | ORAL | 3 refills | Status: DC

## 2017-08-17 NOTE — Patient Instructions (Signed)
[facility] Address: [address] [place], [place], [place] [address] Phone: [phone]

## 2017-08-23 DIAGNOSIS — M530 Cervicocranial syndrome: Secondary | ICD-10-CM | POA: Diagnosis not present

## 2017-08-23 DIAGNOSIS — M9902 Segmental and somatic dysfunction of thoracic region: Secondary | ICD-10-CM | POA: Diagnosis not present

## 2017-08-23 DIAGNOSIS — M9901 Segmental and somatic dysfunction of cervical region: Secondary | ICD-10-CM | POA: Diagnosis not present

## 2017-08-23 DIAGNOSIS — M9903 Segmental and somatic dysfunction of lumbar region: Secondary | ICD-10-CM | POA: Diagnosis not present

## 2017-10-30 ENCOUNTER — Encounter: Payer: Self-pay | Admitting: Gastroenterology

## 2017-11-10 ENCOUNTER — Encounter: Payer: Self-pay | Admitting: Family Medicine

## 2017-11-10 ENCOUNTER — Ambulatory Visit (INDEPENDENT_AMBULATORY_CARE_PROVIDER_SITE_OTHER): Payer: BLUE CROSS/BLUE SHIELD | Admitting: Family Medicine

## 2017-11-10 VITALS — BP 108/80 | HR 61 | Temp 97.6°F | Ht 74.5 in | Wt 204.8 lb

## 2017-11-10 DIAGNOSIS — Z1211 Encounter for screening for malignant neoplasm of colon: Secondary | ICD-10-CM | POA: Diagnosis not present

## 2017-11-10 DIAGNOSIS — E785 Hyperlipidemia, unspecified: Secondary | ICD-10-CM

## 2017-11-10 DIAGNOSIS — R972 Elevated prostate specific antigen [PSA]: Secondary | ICD-10-CM

## 2017-11-10 DIAGNOSIS — Z23 Encounter for immunization: Secondary | ICD-10-CM | POA: Diagnosis not present

## 2017-11-10 DIAGNOSIS — E559 Vitamin D deficiency, unspecified: Secondary | ICD-10-CM

## 2017-11-10 DIAGNOSIS — Z0000 Encounter for general adult medical examination without abnormal findings: Secondary | ICD-10-CM

## 2017-11-10 LAB — COMPREHENSIVE METABOLIC PANEL
ALBUMIN: 4.4 g/dL (ref 3.5–5.2)
ALK PHOS: 45 U/L (ref 39–117)
ALT: 4 U/L (ref 0–53)
AST: 17 U/L (ref 0–37)
BUN: 17 mg/dL (ref 6–23)
CALCIUM: 9.4 mg/dL (ref 8.4–10.5)
CHLORIDE: 102 meq/L (ref 96–112)
CO2: 32 mEq/L (ref 19–32)
Creatinine, Ser: 0.81 mg/dL (ref 0.40–1.50)
GFR: 102.57 mL/min (ref >60.00)
Glucose, Bld: 89 mg/dL (ref 70–99)
POTASSIUM: 4.2 meq/L (ref 3.5–5.1)
Sodium: 139 mEq/L (ref 135–145)
TOTAL PROTEIN: 6.8 g/dL (ref 6.0–8.3)
Total Bilirubin: 0.8 mg/dL (ref 0.2–1.2)

## 2017-11-10 LAB — VITAMIN D 25 HYDROXY (VIT D DEFICIENCY, FRACTURES): VITD: 62.49 ng/mL (ref 30.00–100.00)

## 2017-11-10 LAB — LIPID PANEL
CHOLESTEROL: 182 mg/dL (ref 0–200)
HDL: 42.3 mg/dL (ref >39.00)
LDL CALC: 112 mg/dL — AB (ref 0–99)
NonHDL: 140.15
TRIGLYCERIDES: 143 mg/dL (ref 0.0–149.0)
Total CHOL/HDL Ratio: 4
VLDL: 28.6 mg/dL (ref 0.0–40.0)

## 2017-11-10 LAB — CBC
HEMATOCRIT: 44.8 % (ref 39.0–52.0)
HEMOGLOBIN: 15 g/dL (ref 13.0–17.0)
MCHC: 33.5 g/dL (ref 30.0–36.0)
MCV: 95 fl (ref 78.0–100.0)
Platelets: 182 10*3/uL (ref 150.0–400.0)
RBC: 4.71 Mil/uL (ref 4.22–5.81)
RDW: 13.4 % (ref 11.5–15.5)
WBC: 5.7 10*3/uL (ref 4.0–10.5)

## 2017-11-10 LAB — PSA: PSA: 4.91 ng/mL — AB (ref 0.10–4.00)

## 2017-11-10 NOTE — Patient Instructions (Addendum)
Thanks for doing your tetanus shot!   Please stop by lab before you go  We will call you within two weeks about your referral to GI. If you do not hear within 3 weeks, give [place] a call.

## 2017-11-10 NOTE — Progress Notes (Signed)
Phone: [phone]  Subjective:  Patient presents today for their annual physical. Chief complaint-noted.   See problem oriented charting- ROS- full  review of systems was completed and negative except for: back pain (working with chiropractor)   The following were reviewed and entered/updated in epic: Past Medical History:  Diagnosis Date  . Anxiety   . Bleeding hemorrhoid 05/24/2010  . Depression 08/11/2006   Anxiety as well around down market- tough time with job loss in [employer]    . Hyperlipidemia LDL goal < 130 05/24/2010  . Irritable bowel syndrome 08/29/2007   Improved with diet change     Patient Active Problem List   Diagnosis Date Noted  . Hypotestosteronemia 11/05/2015    Priority: High  . Elevated PSA 10/30/2014    Priority: High  . Parkinsonism ([place]) 10/30/2014    Priority: High  . Low serum vitamin D 04/16/2015    Priority: Medium  . Hyperlipidemia 05/24/2010    Priority: Medium  . Anxiety state 08/11/2006    Priority: Medium  . Bleeding hemorrhoid 05/24/2010    Priority: Low  . Irritable bowel syndrome 08/29/2007    Priority: Low   Past Surgical History:  Procedure Laterality Date  . APPENDECTOMY    . COLONOSCOPY  05-01-00  . HERNIA REPAIR    . ROOT CANAL    . TONSILLECTOMY  1958    Family History  Problem Relation Age of Onset  . Hypertension Mother   . Breast cancer Mother   . Prostate cancer Father        complications from radioactive seeds, colostomy for last 10 years    Medications- reviewed and updated Current Outpatient Medications  Medication Sig Dispense Refill  . Ascorbic Acid (VITAMIN C) 1000 MG tablet Take 1,000 mg by mouth daily.    . B Complex Vitamins (VITAMIN B COMPLEX PO) Take by mouth.    . carbidopa-levodopa (SINEMET IR) 25-100 MG tablet 2 in the AM, 1 in the afternoon, 1 in the evening 360 tablet 3  . cholecalciferol (VITAMIN D) 1000 units tablet Take 1,000 Units by mouth daily.    Marland Kitchen FOLIC ACID PO Take 381 mg by  mouth.     Marland Kitchen glucosamine-chondroitin 500-400 MG tablet Take 1 tablet by mouth 3 (three) times daily.    Marland Kitchen L-Lysine 500 MG TABS Take 500 mg by mouth daily.    . Multiple Vitamin (MULTIVITAMIN) tablet Take 1 tablet by mouth daily.    . Omega-3 Fatty Acids (FISH OIL) 1000 MG CAPS Take 1,000 mg by mouth daily.    . saw palmetto 160 MG capsule Take 160 mg by mouth daily.     No current facility-administered medications for this visit.     Allergies-reviewed and updated No Known Allergies  Social History   Social History Narrative   Lives alone. Long term partner - does not live with him.       Works at the [employer]    Formerly Designer, multimedia.    Goes to [employer]      Hobbies: Exercises regularly, relax with friends    Objective: BP 108/80 (BP Location: Left Arm, Patient Position: Sitting, Cuff Size: Large)   Pulse 61   Temp 97.6 F (36.4 C) (Oral)   Ht 6' 2.5" (1.892 m)   Wt 204 lb 12.8 oz (92.9 kg)   SpO2 96%   BMI 25.94 kg/m  Gen: NAD, resting comfortably HEENT: Mucous membranes are moist. Oropharynx normal  Neck: no thyromegaly CV: RRR no murmurs rubs or gallops Lungs: CTAB no crackles, wheeze, rhonchi Abdomen: soft/nontender/nondistended/normal bowel sounds. No rebound or guarding.  Ext: no edema Skin: warm, dry Neuro: resting tremor noted, moves all extremities, PERRLA  Assessment/Plan:  62 y.o. male presenting for annual physical.  Health Maintenance counseling: 1. Anticipatory guidance: Patient counseled regarding regular dental exams -q6 months with Dr. Darrick Grinder, eye exams -yearly- had at [facility],  avoiding smoking and second hand smoke, limiting alcohol to 2 beverages per day - he doesn't drink regularly- perhaps 1-2x a week .   2. Risk factor reduction:  Advised patient of need for regular exercise and diet rich and fruits and vegetables to reduce risk of heart attack and stroke. Exercise-continues his  exercise classes-does some yoga and works with a trainer on Monday Wednesday Friday for Parkinson's. Diet-reasonable diet-weight largely stable over recent years.- a lot of home cooked food.  Wt Readings from Last 3 Encounters:  11/10/17 204 lb 12.8 oz (92.9 kg)  08/17/17 201 lb 4 oz (91.3 kg)  05/09/17 201 lb 9.6 oz (91.4 kg)  3. Immunizations/screenings/ancillary studies-flu shot given previously.  We advised him of need for new tetanus shot- Tdap given today. Already had shingrix  Immunization History  Administered Date(s) Administered  . Influenza Whole 01/03/2002  . Influenza,inj,Quad PF,6+ Mos 08/19/2017  . Influenza-Unspecified 09/21/2014, 10/08/2015, 08/31/2016  . Td 10/03/1996, 11/05/2007  . Tdap 11/10/2017  . Zoster 11/18/2015  . Zoster Recombinat (Shingrix) 05/25/2016, 10/20/2016  4. Prostate cancer screening- following with urology Dr. Leonie Man a part of [facility].  I encouraged him to follow-up- fortunately last PSA was trending down in our office. They did a PSA and was 4.53 and then repeated 3 days accidentally it appears and up to 4.92. They advised 6 month repeat.  Lab Results  Component Value Date   PSA 4.91 (H) 11/10/2017   PSA 4.64 (H) 05/09/2017   PSA 5.62 01/13/2017   5. Colon cancer screening - 09/2007- refer him back as has been 10 years 6. Skin cancer screening-has upcoming dermatology visit- new dermatologistadvised regular sunscreen use. Denies worrisome, changing, or new skin lesions.  7.  Never smoker  Status of chronic or acute concerns   Parkinson's-continues to follow-up with Dr. Carles Collet.  He remains on Sinemet.  Left foot drop and contracture of left upper arm as well noted.  Also has tremor. He likes his job as it forces him to write, speak, and move which helps him in areas he struggles with - with parkinsons. No falls.   Was dealing with some sciatica last year-working with chiropractor- he states this is much better. He has tweaked gym regimen.    IBS-doing well, no recent issues   Anxiety-currently controlled without medication.  Had been on Zoloft in the past Last visit he did update me that anxiety was worse with mother with ovarian cancer-this is certainly understandable- she got through chemotherapy and is doing well- she is back at friends home and doing well.   Patient with history of low testosterone-he has been off the clomid for some time and doing ok without it. He will continue to follow up with urology  Low vitamin D-we will update this with labs.  Has been treated with boosts of 50,000 units in the past.  Hyperlipidemia- mild elevations in the past.  LDL slightly high at 108 last year.  Update lipids today and calculate ten-year risk if LDL over 100  1 year physical or sooner if needed.  Future Appointments  Date Time Provider [place]  03/07/2018  8:15 AM Tat, Eustace Quail, DO [facility] None   Offered 64-month visit-patient will need to follow-up in 1 year  Lab/Order associations: FASTING Preventative health care  Hyperlipidemia, unspecified hyperlipidemia type - Plan: CBC, Comprehensive metabolic panel, Lipid panel, Lipid panel, Comprehensive metabolic panel, CBC  Elevated PSA - Plan: PSA, PSA  Screening for colon cancer - Plan: Ambulatory referral to Gastroenterology  Need for prophylactic vaccination with combined diphtheria-tetanus-pertussis (DTP) vaccine - Plan: Tdap vaccine greater than or equal to 7yo IM  Vitamin D deficiency - Plan: VITAMIN D 25 Hydroxy (Vit-D Deficiency, Fractures), VITAMIN D 25 Hydroxy (Vit-D Deficiency, Fractures)  Return precautions advised.  [name], MD

## 2017-11-15 ENCOUNTER — Encounter: Payer: Self-pay | Admitting: Gastroenterology

## 2017-11-17 ENCOUNTER — Telehealth: Payer: Self-pay | Admitting: Family Medicine

## 2017-11-17 ENCOUNTER — Encounter: Payer: Self-pay | Admitting: Emergency Medicine

## 2017-11-17 NOTE — Telephone Encounter (Signed)
Noted. Mailed pt results. Faxed labs to Urology

## 2017-11-17 NOTE — Telephone Encounter (Signed)
Pt returned call regarding lab results. Results and recommendations given to him with verbal understanding.  He will work on his diet and exercising more. He also is requesting a copy of his labs to be mailed to him please. And he wanted to know if you could send a copy of his PSA to his urologist.

## 2017-11-29 ENCOUNTER — Ambulatory Visit (AMBULATORY_SURGERY_CENTER): Payer: Self-pay

## 2017-11-29 VITALS — Ht 74.5 in | Wt 203.5 lb

## 2017-11-29 DIAGNOSIS — Z1211 Encounter for screening for malignant neoplasm of colon: Secondary | ICD-10-CM

## 2017-11-29 MED ORDER — NA SULFATE-K SULFATE-MG SULF 17.5-3.13-1.6 GM/177ML PO SOLN: 1.0000 | Freq: Once | ORAL | 0 refills | Status: AC

## 2017-11-29 NOTE — Progress Notes (Signed)
Per pt, no allergies to soy or egg products.Pt not taking any weight loss meds or using  O2 at home.  Pt refused emmi video. 

## 2017-12-08 ENCOUNTER — Encounter: Payer: Self-pay | Admitting: Gastroenterology

## 2017-12-19 DIAGNOSIS — D2261 Melanocytic nevi of right upper limb, including shoulder: Secondary | ICD-10-CM | POA: Diagnosis not present

## 2017-12-19 DIAGNOSIS — D1801 Hemangioma of skin and subcutaneous tissue: Secondary | ICD-10-CM | POA: Diagnosis not present

## 2017-12-19 DIAGNOSIS — D2262 Melanocytic nevi of left upper limb, including shoulder: Secondary | ICD-10-CM | POA: Diagnosis not present

## 2017-12-19 DIAGNOSIS — D225 Melanocytic nevi of trunk: Secondary | ICD-10-CM | POA: Diagnosis not present

## 2017-12-22 ENCOUNTER — Encounter: Payer: Self-pay | Admitting: Gastroenterology

## 2017-12-22 ENCOUNTER — Ambulatory Visit (AMBULATORY_SURGERY_CENTER): Payer: BLUE CROSS/BLUE SHIELD | Admitting: Gastroenterology

## 2017-12-22 VITALS — BP 143/86 | HR 62 | Temp 98.0°F | Resp 16 | Ht 74.5 in | Wt 203.0 lb

## 2017-12-22 DIAGNOSIS — D123 Benign neoplasm of transverse colon: Secondary | ICD-10-CM | POA: Diagnosis not present

## 2017-12-22 DIAGNOSIS — D122 Benign neoplasm of ascending colon: Secondary | ICD-10-CM | POA: Diagnosis not present

## 2017-12-22 DIAGNOSIS — Z1211 Encounter for screening for malignant neoplasm of colon: Secondary | ICD-10-CM | POA: Diagnosis not present

## 2017-12-22 DIAGNOSIS — K635 Polyp of colon: Secondary | ICD-10-CM | POA: Diagnosis not present

## 2017-12-22 MED ORDER — SODIUM CHLORIDE 0.9 % IV SOLN: 500.0000 mL | INTRAVENOUS | Status: DC

## 2017-12-22 NOTE — Progress Notes (Signed)
Called to room to assist during endoscopic procedure.  Patient ID and intended procedure confirmed with present staff. Received instructions for my participation in the procedure from the performing physician.  

## 2017-12-22 NOTE — Progress Notes (Signed)
Report to PACU, RN, vss, BBS= Clear.  

## 2017-12-22 NOTE — Patient Instructions (Signed)
Handouts: Polyps  YOU HAD AN ENDOSCOPIC PROCEDURE TODAY AT THE [facility]:   Refer to the procedure report that was given to you for any specific questions about what was found during the examination.  If the procedure report does not answer your questions, please call your gastroenterologist to clarify.  If you requested that your care partner not be given the details of your procedure findings, then the procedure report has been included in a sealed envelope for you to review at your convenience later.  YOU SHOULD EXPECT: Some feelings of bloating in the abdomen. Passage of more gas than usual.  Walking can help get rid of the air that was put into your GI tract during the procedure and reduce the bloating. If you had a lower endoscopy (such as a colonoscopy or flexible sigmoidoscopy) you may notice spotting of blood in your stool or on the toilet paper. If you underwent a bowel prep for your procedure, you may not have a normal bowel movement for a few days.  Please Note:  You might notice some irritation and congestion in your nose or some drainage.  This is from the oxygen used during your procedure.  There is no need for concern and it should clear up in a day or so.  SYMPTOMS TO REPORT IMMEDIATELY:   Following lower endoscopy (colonoscopy or flexible sigmoidoscopy):  Excessive amounts of blood in the stool  Significant tenderness or worsening of abdominal pains  Swelling of the abdomen that is new, acute  Fever of 100F or higher  For urgent or emergent issues, a gastroenterologist can be reached at any hour by calling [phone].   DIET:  We do recommend a small meal at first, but then you may proceed to your regular diet.  Drink plenty of fluids but you should avoid alcoholic beverages for 24 hours.  ACTIVITY:  You should plan to take it easy for the rest of today and you should NOT DRIVE or use heavy machinery until tomorrow (because of the sedation medicines used  during the test).    FOLLOW UP: Our staff will call the number listed on your records the next business day following your procedure to check on you and address any questions or concerns that you may have regarding the information given to you following your procedure. If we do not reach you, we will leave a message.  However, if you are feeling well and you are not experiencing any problems, there is no need to return our call.  We will assume that you have returned to your regular daily activities without incident.  If any biopsies were taken you will be contacted by phone or by letter within the next 1-3 weeks.  Please call us at [phone] if you have not heard about the biopsies in 3 weeks.    SIGNATURES/CONFIDENTIALITY: You and/or your care partner have signed paperwork which will be entered into your electronic medical record.  These signatures attest to the fact that that the information above on your After Visit Summary has been reviewed and is understood.  Full responsibility of the confidentiality of this discharge information lies with you and/or your care-partner. 

## 2017-12-22 NOTE — Op Note (Signed)
[name] Patient Name: Dan [name] Procedure Date: 12/22/2017 8:25 AM MRN: [id number] Endoscopist: Remo Lipps P. Havery [name] , MD Age: 62 Referring MD:  Date of Birth: 1955/05/02 Gender: Male Account #: [id number] Procedure:                Colonoscopy Indications:              Screening for colorectal malignant neoplasm Medicines:                Monitored Anesthesia Care Procedure:                Pre-Anesthesia Assessment:                           - Prior to the procedure, a History and Physical                            was performed, and patient medications and                            allergies were reviewed. The patient's tolerance of                            previous anesthesia was also reviewed. The risks                            and benefits of the procedure and the sedation                            options and risks were discussed with the patient.                            All questions were answered, and informed consent                            was obtained. Prior Anticoagulants: The patient has                            taken no previous anticoagulant or antiplatelet                            agents. ASA Grade Assessment: II - A patient with                            mild systemic disease. After reviewing the risks                            and benefits, the patient was deemed in                            satisfactory condition to undergo the procedure.                           After obtaining informed consent, the colonoscope  was passed under direct vision. Throughout the                            procedure, the patient's blood pressure, pulse, and                            oxygen saturations were monitored continuously. The                            Model CF-HQ190L [id number]) scope was introduced                            through the anus and advanced to the the cecum,                            identified by  appendiceal orifice and ileocecal                            valve. The colonoscopy was performed without                            difficulty. The patient tolerated the procedure                            well. The quality of the bowel preparation was                            good. The ileocecal valve, appendiceal orifice, and                            rectum were photographed. Scope In: 8:36:52 AM Scope Out: 9:02:12 AM Scope Withdrawal Time: 0 hours 20 minutes 24 seconds  Total Procedure Duration: 0 hours 25 minutes 20 seconds  Findings:                 The perianal and digital rectal examinations were                            normal.                           A 4 mm polyp was found in the ascending colon. The                            polyp was flat. The polyp was removed with a cold                            snare. Resection and retrieval were complete.                           A 4 mm polyp was found in the transverse colon. The                            polyp was sessile. The polyp was removed  with a                            cold snare. Resection and retrieval were complete.                           Internal hemorrhoids were found during                            retroflexion. The hemorrhoids were moderate.                           The colon was tortuous. Right colon was quite                            redundant which prolonged the exam.                           The exam was otherwise without abnormality. Complications:            No immediate complications. Estimated blood loss:                            Minimal. Estimated Blood Loss:     Estimated blood loss was minimal. Impression:               - One 4 mm polyp in the ascending colon, removed                            with a cold snare. Resected and retrieved.                           - One 4 mm polyp in the transverse colon, removed                            with a cold snare. Resected and retrieved.                            - Internal hemorrhoids.                           - Tortuous colon.                           - The examination was otherwise normal. Recommendation:           - Patient has a contact number available for                            emergencies. The signs and symptoms of potential                            delayed complications were discussed with the                            patient. Return to normal activities tomorrow.  Written discharge instructions were provided to the                            patient.                           - Resume previous diet.                           - Continue present medications.                           - Await pathology results. Remo Lipps P. Eiza Canniff, MD 12/22/2017 9:06:31 AM This report has been signed electronically.

## 2017-12-25 ENCOUNTER — Telehealth: Payer: Self-pay

## 2017-12-25 ENCOUNTER — Telehealth: Payer: Self-pay | Admitting: *Deleted

## 2017-12-25 NOTE — Telephone Encounter (Signed)
Called [phone] and left a messaged we tried to reach pt for a follow up call. maw

## 2017-12-25 NOTE — Telephone Encounter (Signed)
Second attempt, left VM.  

## 2018-01-08 ENCOUNTER — Encounter: Payer: Self-pay | Admitting: Family Medicine

## 2018-01-08 DIAGNOSIS — Z860100 Personal history of colon polyps, unspecified: Secondary | ICD-10-CM | POA: Insufficient documentation

## 2018-01-17 ENCOUNTER — Ambulatory Visit: Payer: BLUE CROSS/BLUE SHIELD | Admitting: Neurology

## 2018-02-09 DIAGNOSIS — Z8042 Family history of malignant neoplasm of prostate: Secondary | ICD-10-CM | POA: Diagnosis not present

## 2018-02-09 DIAGNOSIS — G20 Parkinson's disease: Secondary | ICD-10-CM | POA: Diagnosis not present

## 2018-02-09 DIAGNOSIS — R972 Elevated prostate specific antigen [PSA]: Secondary | ICD-10-CM | POA: Diagnosis not present

## 2018-02-14 ENCOUNTER — Ambulatory Visit: Payer: BLUE CROSS/BLUE SHIELD | Admitting: Neurology

## 2018-02-14 ENCOUNTER — Encounter

## 2018-03-06 NOTE — Progress Notes (Signed)
Dan [name] was seen today in the movement disorders clinic for neurologic consultation at the request of Marin Olp, MD.   The patient presents today for neurologic consultation for left foot drop.  The patient's symptoms started about one year ago when the patient noticed that he began to drag his left leg with walking, especially at work.  He states that the leg feels heavy and he has to remind himself to pick the foot up.  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  His friends noted tremor about a year ago, but he didn't notice it until about 6 months ago when he went to pick up a coffee cup and spilled some coffee.  His partner noted that he could no longer keep up with him when they would take walks.  04/23/15 update:  The patient is here today for six-month follow-up.  I have reviewed records made available to me.  He has been working out with a trainer Monday, Wednesday, and Friday at [facility]).  He has noted that the L arm wants to "pull inward."  He has recently been noticing more fatigue.  He had some labwork in that regard.  His TSH was normal.  His B12 was normal at 427.  His vitamin D was slightly low and he was recently started on supplementation.  These labs were done on 04/10/2015.  He has not had any falls since last visit.  No lightheadedness or near syncope.  No hallucinations.  He continues to work at [employer] on [employer] but he puts in 12,000 steps a day.  He notes L arm tremor and feels an inner tremor in the L but no outer tremor.  Sometimes L foot will catch on the ground.    07/16/15 update:  The patient presents today for follow-up.  Last visit, the patient was formally diagnosed with idiopathic Parkinson's disease.  I felt that he would benefit from a dopamine agonist, but he wanted to hold on that.   Since our last visit, the patient feels that he has overall been doing well.  He has not had any falls.  No hallucinations.  No lightheadedness or near syncope.  He is going to the [facility] 3 days a week.  Dx with Vit D deficiency and started on replacement and that helped with fatigue.  Saw dermatology (Dr. Renda Rolls) today and had skin examination  10/22/15 update:  The patient follows up today.  He has a history of Parkinson's disease.  He has wanted no medication for this.  He has noted increased tremor in the L arm.  He is exercising at the [facility] 3 days a week. He does state that he has had plantar fasciitis in the L foot.  He saw podiatry and had a cortisone injection and it is helping.  He has seen his PCP.  He has had labs and his B12, testosterone, vit D is normal.   He has not had any falls.  No hallucinations.  No lightheadedness or near syncope.  Mood is improved.  He attributes this to the fact that his mother has been on list for friends home [place] and she is moving in first week of November.    01/14/16 update:  Patient follows up for his  history of Parkinson's disease.  He is on no medication, which is by his choice.  He continues to exercise 3 days per week at the [facility] and states that he works on core and balance.  Still working full time.  Pt denies falls.  Pt denies lightheadedness unless really working out hard at the gym and no near syncope.  No hallucinations.  No longer having RBD.    Does not that L hand shakes most of the time but not interfering with ADLS's The records that were made available to me were reviewed.  Dr. Yong Channel started patient on zoloft in November and appreciate talking with him about that.  Pt states that after talking with Dr. Yong Channel about the feelings, they resolved and he feels much better.  04/14/16 update:  Patient is seen today in follow-up.  Since our last visit, he did seek a second opinion with Dr. Rexene Alberts.  I have reviewed her records.  She agreed with the  diagnosis.  She agreed with the working plan.  The patient continues to exercise 3 times per week at [facility].  He changed working schedule so only working 4 days per week now.  He denies any falls.  More trouble getting in car with lifting left leg in the car.   He denies any lightheadedness or near syncope.  No hallucinations.  He continues to have left hand tremor and thinks that it has gotten worse.  ADL's are taking longer.  Taking nearly 2 hours to get ready in the AM.   No other new medical problems since our last visit.  Is ready to start medication.  Sleeping well.   08/31/16 update:  Patient seen today in follow-up for his Parkinson's disease.  He was started on pramipexole last visit and work to 0.5 mg 3 times per day.  He ended up stopping the medication in early July.  He felt that it worsened feeling of being "blue" (interestingly, this medication has been used for the treatment of depression in the past) and felt that it was causing morning headache, constipation and mild nausea.  Strangely, the headache would go away once awake (and medication is off at night).  He also did not think that was particularly helping his tremor.  Tremor is worse at work but better during the evening.  If he gets agitated, he will have no tremor.  Otherwise, he feels better off of the medication.  He does think that he needs to cut back more at work because he is pretty exhausted when he gets home and worries he will have an accident in the shower if he is too tired (he hasn't).  He took zoloft for 2 days and felt it didn't work and d/c it.  He did injure the L hip and occasionally it goes down the left leg.  He just did that a week ago.    11/30/16 update: Patient seen today in follow-up for his Parkinson's disease.  I have reviewed numerous records available to me since last visit.  We started him on carbidopa/levodopa 25/100, 1 tablet 3 times per day since last visit.  He initially thought that this was causing shortness  of breath and chest heaviness.  He went to his primary care physician who worked this up and did not find any etiology for this.  He is doing better in this regard.  He has no depression or nausea.  He changed his work schedule to 4 days a week but he is  exhausted at the end of the week so he may try to go to 3 days per week.  His sciatica is bothering him and he is going to the chiropractor but is using a cane.  He is getting somewhat better.  No falls.  Little more trouble getting in and out of the car because of PD.    03/09/17 update: Patient is seen today in follow-up for Parkinson's disease.  Last visit, we increased his carbidopa/levodopa 25/1 100-2 tablets in the morning, 1 in the afternoon and 1 in the evening.  He is doing that and feeling great.  He denies falls.  No lightheadedness.   He did seek a third opinion with Dr. Linus Mako on March 01, 2017.  I have reviewed those records.  No changes were made in his medications.  They mentioned that low-dose pramipexole could be initiated at 0.25 mg 3 times per day.  The patient has been on pramipexole in the past and he thought it worsened depression and caused headache and nausea.  They also discussed with him potentially starting Lexapro for mood.  He states that he is doing really good with mood as he is pain free.   He does state he is seeing chiropractics and he had an MRI lumbar spine which demonstrated LEFT disc herniation at L5-S1 but sx's were right sided.  I personally reviewed the MRI films.  He has not seen a physician.  He is no longer having pain down the right leg and feels really good in that regard.  "I'm better than normal" and feeling good.  08/17/17 update: Patient is seen today in follow-up for Parkinson's disease.  He is on carbidopa/levodopa 25/100, 2 tablets in the morning, 1 in the afternoon and 1 in the evening.  He has tried to take 2 tablets in the middle of the day and may feel better.  "I've had no issues related to my  Parkinsons."  Pt denies falls.  Pt denies lightheadedness, near syncope.  No hallucinations.  Mood has been good.  Records are reviewed since last visit.  The patient saw urology on July 21, 2017 for elevated PSA.  They are just following the PSA at this point in time.  "My biggest issue is my sciatica."  Reports today that symptoms are in the L leg (previously right).  States that he is seeing chiropractics for this and working with [facility] with stretching and balance.  Mom dx with ovarian CA and has been through 4 chemo's and is doing well.  Noting vivid dreams but not acting them out.  Finds dreams are pleasant.  03/07/18 update: Patient is seen today in follow-up for Parkinson's disease.  Patient is on carbidopa/levodopa 25/100, 2 tablets in the morning, now taking 2 in the afternoon and 1 in the evening.  Notes when tired, his body doesn't "move as smoothly."    Patient has had no falls.  No lightheadedness or near syncope.  No hallucinations.  His mood has been good.  He continues to [facility] through [facility].  Continues to work 4 days a week.  Medical records have been reviewed since our last visit.  Patient last saw his primary care physician on November 8.  PREVIOUS MEDICATIONS: Pramipexole (patient thought that it worsened depression and caused headache and nausea)  ALLERGIES:  No Known Allergies  CURRENT MEDICATIONS:  Outpatient Encounter Medications as of 03/07/2018  Medication Sig  . Alpha-Lipoic Acid 200 MG TABS Take by mouth daily.  . Ascorbic Acid (VITAMIN C)  1000 MG tablet Take 1,000 mg by mouth daily. With rose hips  . B Complex Vitamins (VITAMIN B COMPLEX PO) Take by mouth.  . carbidopa-levodopa (SINEMET IR) 25-100 MG tablet 2 in the AM, 1 in the afternoon, 1 in the evening (Patient taking differently: 2 in the AM, 2 in the afternoon, 1 in the evening)  . cholecalciferol (VITAMIN D) 1000 units tablet Take 1,000 Units by mouth daily. Vit d 3- Take 125 mcg daily  . FOLIC ACID PO Take 924  mg by mouth.   Marland Kitchen glucosamine-chondroitin 500-400 MG tablet Take 1 tablet by mouth 3 (three) times daily.  Marland Kitchen L-Lysine 500 MG TABS Take 1,000 mg by mouth daily.   . Multiple Vitamin (MULTIVITAMIN) tablet Take 1 tablet by mouth daily.  . Omega-3 Fatty Acids (FISH OIL) 1000 MG CAPS Take 1,000 mg by mouth daily.  . saw palmetto 160 MG capsule Take 450 mg by mouth daily.   . vitamin B-12 (CYANOCOBALAMIN) 1000 MCG tablet Take 1,000 mcg by mouth daily.   No facility-administered encounter medications on file as of 03/07/2018.     PAST MEDICAL HISTORY:   Past Medical History:  Diagnosis Date  . Allergy    seasonal  . Anxiety   . Bleeding hemorrhoid 05/24/2010  . Depression 08/11/2006   Anxiety as well around down market- tough time with job loss in [employer]    . Hyperlipidemia LDL goal < 130 05/24/2010  . Irritable bowel syndrome 08/29/2007   Improved with diet change    . Parkinson disease ([place])    since 2016    PAST SURGICAL HISTORY:   Past Surgical History:  Procedure Laterality Date  . APPENDECTOMY    . COLONOSCOPY  05-01-00  . HERNIA REPAIR  2014   Bil inguinal hernia repair  . ROOT CANAL    . TONSILLECTOMY  1958    SOCIAL HISTORY:   Social History   Socioeconomic History  . Marital status: Single    Spouse name: Not on file  . Number of children: Not on file  . Years of education: Not on file  . Highest education level: Not on file  Occupational History  . Occupation: Public relations account executive    Comment: works at [employer]  . Financial resource strain: Not on file  . Food insecurity:    Worry: Not on file    Inability: Not on file  . Transportation needs:    Medical: Not on file    Non-medical: Not on file  Tobacco Use  . Smoking status: Never Smoker  . Smokeless tobacco: Never Used  Substance and Sexual Activity  . Alcohol use: Yes    Alcohol/week: 0.0 standard drinks    Comment: social  . Drug use: No  . Sexual activity: Not on file    Lifestyle  . Physical activity:    Days per week: Not on file    Minutes per session: Not on file  . Stress: Not on file  Relationships  . Social connections:    Talks on phone: Not on file    Gets together: Not on file    Attends religious service: Not on file    Active member of club or organization: Not on file    Attends meetings of clubs or organizations: Not on file    Relationship status: Not on file  . Intimate partner violence:    Fear of current or ex partner: Not on file    Emotionally abused:  Not on file    Physically abused: Not on file    Forced sexual activity: Not on file  Other Topics Concern  . Not on file  Social History Narrative   Lives alone. Long term partner - does not live with him.       Works at the [employer]    Formerly Designer, multimedia.    Goes to [employer]      Hobbies: Exercises regularly, relax with friends    FAMILY HISTORY:   Family Status  Relation Name Status  . Brother  Alive       HIV controlled  . Brother  Alive       HIV controlled  . Sister  Alive       healthy  . Mother  Alive       Breast cancer  . Father  Deceased       27, prostate cancer 10 years prior to death     ROS:  Review of Systems  Constitutional: Negative.   HENT: Negative.   Eyes: Negative.   Respiratory: Negative.   Cardiovascular: Negative.   Gastrointestinal: Negative.   Genitourinary: Negative.   Musculoskeletal: Negative.   Skin: Negative.      PHYSICAL EXAMINATION:    VITALS:   Vitals:   03/07/18 0807  BP: 100/66  Pulse: 80  SpO2: 96%  Weight: 203 lb (92.1 kg)  Height: 6' 2.5" (1.892 m)   Wt Readings from Last 3 Encounters:  03/07/18 203 lb (92.1 kg)  12/22/17 203 lb (92.1 kg)  11/29/17 203 lb 8.6 oz (92.3 kg)    GEN:  The patient appears stated age and is in NAD. HEENT:  Normocephalic, atraumatic.  The mucous membranes are moist. The superficial temporal arteries are  without ropiness or tenderness. CV:  RRR Lungs:  CTAB Neck/HEME:  There are no carotid bruits bilaterally.  Neurological examination:  Orientation: The patient is alert and oriented x3. Cranial nerves: There is good facial symmetry. The speech is fluent and clear. Soft palate rises symmetrically and there is no tongue deviation. Hearing is intact to conversational tone. Sensation: Sensation is intact to light touch throughout Motor: Strength is 5/5 in the bilateral upper and lower extremities.   Shoulder shrug is equal and symmetric.  There is no pronator drift.   Movement examination: Tone: There is normal tone in the UE/LE Abnormal movements: There is a intermittent LUE resting tremor that increases with distraction. Coordination:  There is no decremation, with any form of RAMS, including alternating supination and pronation of the forearm, hand opening and closing, finger taps, heel taps and toe taps bilaterally.   Gait and Station: The patient has no difficulty arising out of a deep-seated chair without the use of the hands.  He walks well down the hall with good arm swing.  Negative pull test  Labs:    Chemistry      Component Value Date/Time   NA 139 11/10/2017 0854   K 4.2 11/10/2017 0854   CL 102 11/10/2017 0854   CO2 32 11/10/2017 0854   BUN 17 11/10/2017 0854   CREATININE 0.81 11/10/2017 0854      Component Value Date/Time   CALCIUM 9.4 11/10/2017 0854   ALKPHOS 45 11/10/2017 0854   AST 17 11/10/2017 0854   ALT 4 11/10/2017 0854   BILITOT 0.8 11/10/2017 0854     Lab Results  Component Value Date   TSH 0.99 05/09/2017  ASSESSMENT/PLAN:  1.  Idiopathic Parkinsons disease  -doing well.    -just did dermatology visit with Dr. Ronnald Ramp.  Will continue with that yearly.    -continue carbidopa/levodopa 25/100, 2/2/1.    -asked about pathophysiology/prognosis   2.  Low back pain, right (lumbar radic)  -stretching exercises and yoga have helped.  3.   Follow  up is anticipated in the next 6 months, sooner should new neurologic issues arise.  Much greater than 50% of this visit was spent in counseling and coordinating care.  Total face to face time:  25 min.

## 2018-03-07 ENCOUNTER — Encounter: Payer: Self-pay | Admitting: Neurology

## 2018-03-07 ENCOUNTER — Ambulatory Visit: Payer: BLUE CROSS/BLUE SHIELD | Admitting: Neurology

## 2018-03-07 VITALS — BP 100/66 | HR 80 | Ht 74.5 in | Wt 203.0 lb

## 2018-03-07 DIAGNOSIS — G20 Parkinson's disease: Secondary | ICD-10-CM

## 2018-03-07 NOTE — Patient Instructions (Signed)
The physicians and staff at [facility] are committed to providing excellent care. You may receive a survey requesting feedback about your experience at our office. We strive to receive "very good" responses to the survey questions. If you feel that your experience would prevent you from giving the office a "very good " response, please contact our office to try to remedy the situation. We may be reached at [phone]. Thank you for taking the time out of your busy day to complete the survey.  

## 2018-09-04 ENCOUNTER — Other Ambulatory Visit: Payer: Self-pay | Admitting: Neurology

## 2018-09-04 NOTE — Telephone Encounter (Signed)
Requested Prescriptions   Pending Prescriptions Disp Refills  . carbidopa-levodopa (SINEMET IR) 25-100 MG tablet [Pharmacy Med Name: CARBIDOPA-LEVODOPA 25-100 TAB] 360 tablet 3    Sig: TAKE 2 TABS IN THE AM, 1 IN THE AFTERNOON, 1 IN THE EVENING   Rx last filled: 08/17/2017 #360 3 refills  Pt last seen:03/07/18  Follow up appt scheduled:09/25/18

## 2018-09-12 ENCOUNTER — Ambulatory Visit: Payer: BLUE CROSS/BLUE SHIELD | Admitting: Neurology

## 2018-09-24 NOTE — Progress Notes (Signed)
Dan [name] was seen today in the movement disorders clinic for neurologic consultation at the request of Marin Olp, MD.   The patient presents today for neurologic consultation for left foot drop.  The patients symptoms started about one year ago when the patient noticed that he began to drag his left leg with walking, especially at work.  He states that the leg feels heavy and he has to remind himself to pick the foot up.  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  His friends noted tremor about a year ago, but he didn't notice it until about 6 months ago when he went to pick up a coffee cup and spilled some coffee.  His partner noted that he could no longer keep up with him when they would take walks.  04/23/15 update:  The patient is here today for six-month follow-up.  I have reviewed records made available to me.  He has been working out with a trainer Monday, Wednesday, and Friday at [facility]).  He has noted that the L arm wants to "pull inward."  He has recently been noticing more fatigue.  He had some labwork in that regard.  His TSH was normal.  His B12 was normal at 427.  His vitamin D was slightly low and he was recently started on supplementation.  These labs were done on 04/10/2015.  He has not had any falls since last visit.  No lightheadedness or near syncope.  No hallucinations.  He continues to work at [employer] on [employer] but he puts in 12,000 steps a day.  He notes L arm tremor and feels an inner tremor in the L but no outer tremor.  Sometimes L foot will catch on the ground.    07/16/15 update:  The patient presents today for follow-up.  Last visit, the patient was formally diagnosed with idiopathic Parkinsons disease.  I felt that he would benefit from a dopamine agonist, but he wanted to hold on that.   Since our last visit, the patient feels that he has overall been doing well.  He has not had any falls.  No hallucinations.  No lightheadedness or near syncope.  He is going to the [facility] gym 3 days a week.  Dx with Vit D deficiency and started on replacement and that helped with fatigue.  Saw dermatology (Dr. Renda Rolls) today and had skin examination  10/22/15 update:  The patient follows up today.  He has a history of Parkinson's disease.  He has wanted no medication for this.  He has noted increased tremor in the L arm.  He is exercising at the [facility] 3 days a week. He does state that he has had plantar fasciitis in the L foot.  He saw podiatry and had a cortisone injection and it is helping.  He has seen his PCP.  He has had labs and his B12, testosterone, vit D is normal.   He has not had any falls.  No hallucinations.  No lightheadedness or near syncope.  Mood is improved.  He attributes this to the fact that his mother has been on list for friends home [place] and she is moving in first week of November.    01/14/16 update:  Patient follows up for his  history of Parkinson's disease.  He is on no medication, which is by his choice.  He continues to exercise 3 days per week at the [facility] gym and states that he works on core and balance.  Still working full time.  Pt denies falls.  Pt denies lightheadedness unless really working out hard at the gym and no near syncope.  No hallucinations.  No longer having RBD.    Does not that L hand shakes most of the time but not interfering with ADLS's The records that were made available to me were reviewed.  Dr. Yong Channel started patient on zoloft in November and appreciate talking with him about that.  Pt states that after talking with Dr. Yong Channel about the feelings, they resolved and he feels much better.  04/14/16 update:  Patient is seen today in follow-up.  Since our last visit, he did seek a second opinion with Dr. Rexene Alberts.  I have reviewed her records.  She agreed with the  diagnosis.  She agreed with the working plan.  The patient continues to exercise 3 times per week at [facility].  He changed working schedule so only working 4 days per week now.  He denies any falls.  More trouble getting in car with lifting left leg in the car.   He denies any lightheadedness or near syncope.  No hallucinations.  He continues to have left hand tremor and thinks that it has gotten worse.  ADL's are taking longer.  Taking nearly 2 hours to get ready in the AM.   No other new medical problems since our last visit.  Is ready to start medication.  Sleeping well.   08/31/16 update:  Patient seen today in follow-up for his Parkinson's disease.  He was started on pramipexole last visit and work to 0.5 mg 3 times per day.  He ended up stopping the medication in early July.  He felt that it worsened feeling of being "blue" (interestingly, this medication has been used for the treatment of depression in the past) and felt that it was causing morning headache, constipation and mild nausea.  Strangely, the headache would go away once awake (and medication is off at night).  He also did not think that was particularly helping his tremor.  Tremor is worse at work but better during the evening.  If he gets agitated, he will have no tremor.  Otherwise, he feels better off of the medication.  He does think that he needs to cut back more at work because he is pretty exhausted when he gets home and worries he will have an accident in the shower if he is too tired (he hasn't).  He took zoloft for 2 days and felt it didn't work and d/c it.  He did injure the L hip and occasionally it goes down the left leg.  He just did that a week ago.    11/30/16 update: Patient seen today in follow-up for his Parkinson's disease.  I have reviewed numerous records available to me since last visit.  We started him on carbidopa/levodopa 25/100, 1 tablet 3 times per day since last visit.  He initially thought that this was causing shortness  of breath and chest heaviness.  He went to his primary care physician who worked this up and did not find any etiology for this.  He is doing better in this regard.  He has no depression or nausea.  He changed his work schedule to 4 days a week but he is  exhausted at the end of the week so he may try to go to 3 days per week.  His sciatica is bothering him and he is going to the chiropractor but is using a cane.  He is getting somewhat better.  No falls.  Little more trouble getting in and out of the car because of PD.    03/09/17 update: Patient is seen today in follow-up for Parkinson's disease.  Last visit, we increased his carbidopa/levodopa 25/1 100-2 tablets in the morning, 1 in the afternoon and 1 in the evening.  He is doing that and feeling great.  He denies falls.  No lightheadedness.   He did seek a third opinion with Dr. Linus Mako on March 01, 2017.  I have reviewed those records.  No changes were made in his medications.  They mentioned that low-dose pramipexole could be initiated at 0.25 mg 3 times per day.  The patient has been on pramipexole in the past and he thought it worsened depression and caused headache and nausea.  They also discussed with him potentially starting Lexapro for mood.  He states that he is doing really good with mood as he is pain free.   He does state he is seeing chiropractics and he had an MRI lumbar spine which demonstrated LEFT disc herniation at L5-S1 but sx's were right sided.  I personally reviewed the MRI films.  He has not seen a physician.  He is no longer having pain down the right leg and feels really good in that regard.  "I'm better than normal" and feeling good.  08/17/17 update: Patient is seen today in follow-up for Parkinson's disease.  He is on carbidopa/levodopa 25/100, 2 tablets in the morning, 1 in the afternoon and 1 in the evening.  He has tried to take 2 tablets in the middle of the day and may feel better.  "I've had no issues related to my  Parkinsons."  Pt denies falls.  Pt denies lightheadedness, near syncope.  No hallucinations.  Mood has been good.  Records are reviewed since last visit.  The patient saw urology on July 21, 2017 for elevated PSA.  They are just following the PSA at this point in time.  "My biggest issue is my sciatica."  Reports today that symptoms are in the L leg (previously right).  States that he is seeing chiropractics for this and working with [facility] with stretching and balance.  Mom dx with ovarian CA and has been through 4 chemo's and is doing well.  Noting vivid dreams but not acting them out.  Finds dreams are pleasant.  03/07/18 update: Patient is seen today in follow-up for Parkinson's disease.  Patient is on carbidopa/levodopa 25/100, 2 tablets in the morning, now taking 2 in the afternoon and 1 in the evening.  Notes when tired, his body doesn't "move as smoothly."    Patient has had no falls.  No lightheadedness or near syncope.  No hallucinations.  His mood has been good.  He continues to exercise through [facility].  Continues to work 4 days a week.  Medical records have been reviewed since our last visit.  Patient last saw his primary care physician on November 8.  09/25/18 update: Patient seen today in follow-up for Parkinson's disease.  He is on carbidopa/levodopa 25/100, 2 tablets in the morning, 2 in the afternoon and 1 in the evening.  He has had no falls since last visit no lightheadedness or near syncope.  He is continuing to exercise  faithfully at [facility].  Pt really pleased that he didn't gain weight during the pandemic and kept active.  He also modified his work schedule to mon/wed/frid and feels good about that.  He is less fatigued now.  His last dermatology visit was last March.  Having some intermittent flares of his "sciatica" and worse when standing on concrete.  He does yoga and stretching.    PREVIOUS MEDICATIONS: Pramipexole (patient thought that it worsened depression and caused headache and  nausea)  ALLERGIES:  No Known Allergies  CURRENT MEDICATIONS:  Outpatient Encounter Medications as of 09/25/2018  Medication Sig   Alpha-Lipoic Acid 200 MG TABS Take by mouth daily.   Ascorbic Acid (VITAMIN C) 1000 MG tablet Take 1,000 mg by mouth daily. With rose hips   B Complex Vitamins (VITAMIN B COMPLEX PO) Take by mouth.   carbidopa-levodopa (SINEMET IR) 25-100 MG tablet 2 in the AM, 2 in the afternoon, 1 in the evening   cholecalciferol (VITAMIN D) 1000 units tablet Take 1,000 Units by mouth daily. Vit d 3- Take 756 mcg daily   FOLIC ACID PO Take 433 mg by mouth.    glucosamine-chondroitin 500-400 MG tablet Take 1 tablet by mouth 3 (three) times daily.   L-Lysine 500 MG TABS Take 1,000 mg by mouth daily.    Multiple Vitamin (MULTIVITAMIN) tablet Take 1 tablet by mouth daily.   Omega-3 Fatty Acids (FISH OIL) 1000 MG CAPS Take 1,000 mg by mouth daily.   saw palmetto 160 MG capsule Take 450 mg by mouth daily.    vitamin B-12 (CYANOCOBALAMIN) 1000 MCG tablet Take 1,000 mcg by mouth daily.   No facility-administered encounter medications on file as of 09/25/2018.     PAST MEDICAL HISTORY:   Past Medical History:  Diagnosis Date   Allergy    seasonal   Anxiety    Bleeding hemorrhoid 05/24/2010   Depression 08/11/2006   Anxiety as well around down market- tough time with job loss in [place]     Hyperlipidemia LDL goal < 130 05/24/2010   Irritable bowel syndrome 08/29/2007   Improved with diet change     Parkinson disease ([place])    since 2016    PAST SURGICAL HISTORY:   Past Surgical History:  Procedure Laterality Date   APPENDECTOMY     COLONOSCOPY  05-01-00   HERNIA REPAIR  2014   Bil inguinal hernia repair   ROOT CANAL     TONSILLECTOMY  1958    SOCIAL HISTORY:   Social History   Socioeconomic History   Marital status: Single    Spouse name: Not on file   Number of children: Not on file   Years of education: Not on file    Highest education level: Not on file  Occupational History   Occupation: Public relations account executive    Comment: works at [employer] strain: Not on file   Food insecurity    Worry: Not on file    Inability: Not on file   Transportation needs    Medical: Not on file    Non-medical: Not on file  Tobacco Use   Smoking status: Never Smoker   Smokeless tobacco: Never Used  Substance and Sexual Activity   Alcohol use: Yes    Alcohol/week: 0.0 standard drinks    Comment: social   Drug use: No   Sexual activity: Not on file  Lifestyle   Physical activity    Days per week: Not  on file    Minutes per session: Not on file   Stress: Not on file  Relationships   Social connections    Talks on phone: Not on file    Gets together: Not on file    Attends religious service: Not on file    Active member of club or organization: Not on file    Attends meetings of clubs or organizations: Not on file    Relationship status: Not on file   Intimate partner violence    Fear of current or ex partner: Not on file    Emotionally abused: Not on file    Physically abused: Not on file    Forced sexual activity: Not on file  Other Topics Concern   Not on file  Social History Narrative   Lives alone. Long term partner - does not live with him.       Works at the [employer]    Formerly Designer, multimedia.    Goes to [employer]      Hobbies: Exercises regularly, relax with friends    FAMILY HISTORY:   Family Status  Relation Name Status   Brother  Alive       HIV controlled   Brother  Alive       HIV controlled   Sister  Alive       healthy   Mother  Alive       Breast cancer   Father  Deceased       75, prostate cancer 10 years prior to death     ROS:  Review of Systems  Constitutional: Positive for malaise/fatigue (after 4pm, has fatigue).  HENT: Negative.   Eyes: Negative.     Cardiovascular: Negative.   Gastrointestinal: Negative.   Genitourinary: Negative.   Musculoskeletal: Positive for back pain and joint pain.  Skin: Negative.      PHYSICAL EXAMINATION:    VITALS:   There were no vitals filed for this visit. Wt Readings from Last 3 Encounters:  03/07/18 203 lb (92.1 kg)  12/22/17 203 lb (92.1 kg)  11/29/17 203 lb 8.6 oz (92.3 kg)    GEN:  The patient appears stated age and is in NAD. HEENT:  Normocephalic, atraumatic.  The mucous membranes are moist. The superficial temporal arteries are without ropiness or tenderness. CV:  RRR Lungs:  CTAB Neck/HEME:  There are no carotid bruits bilaterally.  Neurological examination:  Orientation: The patient is alert and oriented x3. Cranial nerves: There is good facial symmetry. The speech is fluent and clear. Soft palate rises symmetrically and there is no tongue deviation. Hearing is intact to conversational tone. Sensation: Sensation is intact to light touch throughout Motor: Strength is 5/5 in the bilateral upper and lower extremities.   Shoulder shrug is equal and symmetric.  There is no pronator drift.   Movement examination: Tone: There is normal tone in the UE/LE Abnormal movements: There is LUE rest tremor Coordination:  There is mild decremation with toe taps on the L.  All other RAMs are good  Gait and Station: The patient has no difficulty arising out of a deep-seated chair without the use of the hands.  He walks well down the hall with decreased arm swing on the L.   Negative pull test  Labs:    Chemistry      Component Value Date/Time   NA 139 11/10/2017 0854   K 4.2 11/10/2017 0854   CL 102  11/10/2017 0854   CO2 32 11/10/2017 0854   BUN 17 11/10/2017 0854   CREATININE 0.81 11/10/2017 0854      Component Value Date/Time   CALCIUM 9.4 11/10/2017 0854   ALKPHOS 45 11/10/2017 0854   AST 17 11/10/2017 0854   ALT 4 11/10/2017 0854   BILITOT 0.8 11/10/2017 0854     Lab Results   Component Value Date   TSH 0.99 05/09/2017     ASSESSMENT/PLAN:  1.  Idiopathic Parkinsons disease  -doing well.    -Continue to follow yearly with Dr. Ronnald Ramp  -continue carbidopa/levodopa 25/100, 2/2/1.    -Discussed importance of safe, cardiovascular exercise.  He is exercising with [facility]  -Met with my social worker today.  Resources provided.   2.  Low back pain, right (lumbar radic)  -stretching exercises and yoga have helped.  3.   Follow up is anticipated in the next 4-6 months, sooner should new neurologic issues arise. Much greater than 50% of this visit was spent in counseling and coordinating care.  Total face to face time:  25 min

## 2018-09-25 ENCOUNTER — Other Ambulatory Visit: Payer: Self-pay

## 2018-09-25 ENCOUNTER — Ambulatory Visit (INDEPENDENT_AMBULATORY_CARE_PROVIDER_SITE_OTHER): Payer: BC Managed Care – PPO | Admitting: Neurology

## 2018-09-25 ENCOUNTER — Encounter: Payer: Self-pay | Admitting: Neurology

## 2018-09-25 VITALS — BP 126/83 | HR 67 | Ht 74.5 in | Wt 194.2 lb

## 2018-09-25 DIAGNOSIS — G20 Parkinson's disease: Secondary | ICD-10-CM | POA: Diagnosis not present

## 2018-09-25 NOTE — Patient Instructions (Signed)
So good to see you today!  Let me know if there is anything you need before next visit.

## 2018-10-01 DIAGNOSIS — Z8042 Family history of malignant neoplasm of prostate: Secondary | ICD-10-CM | POA: Diagnosis not present

## 2018-10-01 DIAGNOSIS — G20 Parkinson's disease: Secondary | ICD-10-CM | POA: Diagnosis not present

## 2018-10-01 DIAGNOSIS — R972 Elevated prostate specific antigen [PSA]: Secondary | ICD-10-CM | POA: Diagnosis not present

## 2018-10-16 ENCOUNTER — Telehealth: Payer: Self-pay | Admitting: Neurology

## 2018-10-16 NOTE — Telephone Encounter (Signed)
I would still recommend the 2 in the AM when first wakes up, but then he can spread the other 3 out evenly.  So maybe 2 tablets at 7am/1 at 11/1 at 3pm/1 at 7pm

## 2018-10-16 NOTE — Telephone Encounter (Signed)
Spoke with patient he states 2 at 6 am 2 at 11am 1 at 4pm  Pt c/o fatigue since last visit.  Pot notice when medication wears off his left hand shakes a lot more.

## 2018-10-16 NOTE — Telephone Encounter (Signed)
Called patient to get times he is taken no answer left message to call office back with this.  Pt current taken 2/2/1 per last office visit

## 2018-10-16 NOTE — Telephone Encounter (Signed)
Patient wants to know if he can spread his  caridopa levodopa out through out the day. He states he take 5 pills a day and he is not feeling well. He states that when his medication wear off that he is very fatigue  Please call

## 2018-10-17 NOTE — Telephone Encounter (Signed)
Called no answer left message to contact office to received provider recommendations for Rx.

## 2018-10-18 NOTE — Telephone Encounter (Signed)
Pt called patient he was given provider response regarding instructions on Rx

## 2018-11-08 ENCOUNTER — Telehealth: Payer: Self-pay | Admitting: Neurology

## 2018-11-08 NOTE — Telephone Encounter (Signed)
Patient called requesting a call back from a nurse. He said he has some new symptoms that he wants to report like increased shaking.

## 2018-11-08 NOTE — Telephone Encounter (Signed)
Left message for patient to return call.

## 2018-11-09 NOTE — Telephone Encounter (Signed)
Patient states that he is shaking way to much now and is way to exhausted. Entering a phase of pain in his left hip. No other symptoms. Concerned that he is in "a different place now" and may need adjustments to his medication. He feels that he needs to be taking more tablets of the carbidopa levidopa. Please advise

## 2018-11-09 NOTE — Telephone Encounter (Signed)
Left message for a return call

## 2018-11-09 NOTE — Telephone Encounter (Signed)
Left a message for a return call.

## 2018-11-09 NOTE — Telephone Encounter (Signed)
Patient left msg returning call. Thanks!

## 2018-11-10 ENCOUNTER — Other Ambulatory Visit: Payer: Self-pay | Admitting: Neurology

## 2018-11-12 NOTE — Telephone Encounter (Signed)
Per patient request sent him a mychart message to discuss

## 2018-11-12 NOTE — Telephone Encounter (Signed)
Patient is aware of hip pain. He can not do a virtual visit but he could do a morning in person visit. Yes we do have the samples

## 2018-11-12 NOTE — Telephone Encounter (Signed)
Unfortunately, he would have to wait for that or be on a cancellation list.  Those appts are really booking out far and the work in days are video/virtual days.  Happy to put him on that cancellation list if that is best for him.  If he would rather that, he can work with the front desk to see next avail and be put on cx list

## 2018-11-12 NOTE — Telephone Encounter (Signed)
1.  Hip pain is unrelated to PD.  Make sure f/u with PCP.  I believe he has a chronic sciatic issue 2.  Do we have neupro samples 3.  Have front staff put him on for VV at 9:15 on wed

## 2018-11-12 NOTE — Telephone Encounter (Signed)
I won't be in the office on wed and that is my only day for work in visits unfortunately.  He doesn't have a smart phone or computer that has video capability?  He is pretty savvy from what I know of him.  Do we only have the 2mg  samples or do we have 4 mg as well?

## 2018-11-12 NOTE — Telephone Encounter (Signed)
He states that he has no privacy at work and needs a in person visit so that he can feel he is evaluated properly. Samples are for 1mg , 2&4mg , 2mg , and 4mg 

## 2018-11-12 NOTE — Telephone Encounter (Signed)
I offered appt to patient and he declined. He said he was going to "write down everything that has been going on and then review it and get back with [place]". Just FYI. Thanks!

## 2018-11-14 NOTE — Telephone Encounter (Signed)
Sending to clinical staff for review: Okay to sign/close encounter or is further follow up needed? ° °

## 2018-11-15 ENCOUNTER — Encounter: Payer: Self-pay | Admitting: Family Medicine

## 2018-11-15 ENCOUNTER — Other Ambulatory Visit: Payer: Self-pay

## 2018-11-15 ENCOUNTER — Ambulatory Visit (INDEPENDENT_AMBULATORY_CARE_PROVIDER_SITE_OTHER): Payer: BC Managed Care – PPO | Admitting: Family Medicine

## 2018-11-15 VITALS — BP 124/82 | HR 67 | Temp 98.7°F | Ht 74.5 in | Wt 193.6 lb

## 2018-11-15 DIAGNOSIS — Z860100 Personal history of colon polyps, unspecified: Secondary | ICD-10-CM

## 2018-11-15 DIAGNOSIS — R972 Elevated prostate specific antigen [PSA]: Secondary | ICD-10-CM

## 2018-11-15 DIAGNOSIS — Z0000 Encounter for general adult medical examination without abnormal findings: Secondary | ICD-10-CM

## 2018-11-15 DIAGNOSIS — E785 Hyperlipidemia, unspecified: Secondary | ICD-10-CM

## 2018-11-15 DIAGNOSIS — E349 Endocrine disorder, unspecified: Secondary | ICD-10-CM | POA: Diagnosis not present

## 2018-11-15 DIAGNOSIS — F411 Generalized anxiety disorder: Secondary | ICD-10-CM

## 2018-11-15 DIAGNOSIS — G20 Parkinson's disease: Secondary | ICD-10-CM | POA: Diagnosis not present

## 2018-11-15 DIAGNOSIS — R7989 Other specified abnormal findings of blood chemistry: Secondary | ICD-10-CM

## 2018-11-15 DIAGNOSIS — G20A1 Parkinson's disease without dyskinesia, without mention of fluctuations: Secondary | ICD-10-CM

## 2018-11-15 LAB — COMPREHENSIVE METABOLIC PANEL
ALT: 5 U/L (ref 0–53)
AST: 13 U/L (ref 0–37)
Albumin: 4.4 g/dL (ref 3.5–5.2)
Alkaline Phosphatase: 48 U/L (ref 39–117)
BUN: 16 mg/dL (ref 6–23)
CO2: 30 mEq/L (ref 19–32)
Calcium: 9.4 mg/dL (ref 8.4–10.5)
Chloride: 102 mEq/L (ref 96–112)
Creatinine, Ser: 0.89 mg/dL (ref 0.40–1.50)
GFR: 86.28 mL/min (ref >60.00)
Glucose, Bld: 96 mg/dL (ref 70–99)
Potassium: 4.1 mEq/L (ref 3.5–5.1)
Sodium: 139 mEq/L (ref 135–145)
Total Bilirubin: 0.8 mg/dL (ref 0.2–1.2)
Total Protein: 6.6 g/dL (ref 6.0–8.3)

## 2018-11-15 LAB — LIPID PANEL
Cholesterol: 182 mg/dL (ref 0–200)
HDL: 47.7 mg/dL (ref >39.00)
LDL Cholesterol: 116 mg/dL — ABNORMAL HIGH (ref 0–99)
NonHDL: 134.26
Total CHOL/HDL Ratio: 4
Triglycerides: 92 mg/dL (ref 0.0–149.0)
VLDL: 18.4 mg/dL (ref 0.0–40.0)

## 2018-11-15 LAB — CBC WITH DIFFERENTIAL/PLATELET
Basophils Absolute: 0 10*3/uL (ref 0.0–0.1)
Basophils Relative: 0.7 % (ref 0.0–3.0)
Eosinophils Absolute: 0.1 10*3/uL (ref 0.0–0.7)
Eosinophils Relative: 1.1 % (ref 0.0–5.0)
HCT: 43.8 % (ref 39.0–52.0)
Hemoglobin: 14.6 g/dL (ref 13.0–17.0)
Lymphocytes Relative: 31.4 % (ref 12.0–46.0)
Lymphs Abs: 1.7 10*3/uL (ref 0.7–4.0)
MCHC: 33.3 g/dL (ref 30.0–36.0)
MCV: 95.6 fl (ref 78.0–100.0)
Monocytes Absolute: 0.7 10*3/uL (ref 0.1–1.0)
Monocytes Relative: 12.6 % — ABNORMAL HIGH (ref 3.0–12.0)
Neutro Abs: 2.9 10*3/uL (ref 1.4–7.7)
Neutrophils Relative %: 54.2 % (ref 43.0–77.0)
Platelets: 182 10*3/uL (ref 150.0–400.0)
RBC: 4.58 Mil/uL (ref 4.22–5.81)
RDW: 13.2 % (ref 11.5–15.5)
WBC: 5.3 10*3/uL (ref 4.0–10.5)

## 2018-11-15 LAB — VITAMIN D 25 HYDROXY (VIT D DEFICIENCY, FRACTURES): VITD: 51.3 ng/mL (ref 30.00–100.00)

## 2018-11-15 NOTE — Patient Instructions (Addendum)
Great to see you today!  Glad you are doing so well  Please stop by lab before you go If you do not have mychart- we will call you about results within 5 business days of [place] receiving them.  If you have mychart- we will send your results within 3 business days of [place] receiving them.  If abnormal or we want to clarify a result, we will call or mychart you to make sure you receive the message.  If you have questions or concerns or don't hear within 5-7 days, please send [place] a message or call us.

## 2018-11-15 NOTE — Progress Notes (Signed)
Phone: [phone]   Subjective:  Patient presents today for their annual physical. Chief complaint-noted.   See problem oriented charting- Review of Systems  Constitutional: Negative.   HENT: Positive for tinnitus.   Eyes: Negative.   Respiratory: Negative.   Cardiovascular: Negative.   Musculoskeletal: Positive for back pain.       Pain in left side into hip   Skin: Negative.   Neurological: Positive for tremors and weakness.       History of parkinson has had some increased on left side    Endo/Heme/Allergies: Negative.   Psychiatric/Behavioral: Negative.   - full  review of systems was completed and negative  except for:   The following were reviewed and entered/updated in epic: Past Medical History:  Diagnosis Date  . Allergy    seasonal  . Anxiety   . Bleeding hemorrhoid 05/24/2010  . Depression 08/11/2006   Anxiety as well around down market- tough time with job loss in [employer]    . Hyperlipidemia LDL goal < 130 05/24/2010  . Irritable bowel syndrome 08/29/2007   Improved with diet change    . Parkinson disease ([place])    since 2016   Patient Active Problem List   Diagnosis Date Noted  . Hypotestosteronemia 11/05/2015    Priority: High  . Elevated PSA 10/30/2014    Priority: High  . Parkinsonism ([place]) 10/30/2014    Priority: High  . Low serum vitamin D 04/16/2015    Priority: Medium  . Hyperlipidemia 05/24/2010    Priority: Medium  . Anxiety state 08/11/2006    Priority: Medium  . Bleeding hemorrhoid 05/24/2010    Priority: Low  . Irritable bowel syndrome 08/29/2007    Priority: Low  . History of colon polyps 01/08/2018   Past Surgical History:  Procedure Laterality Date  . APPENDECTOMY    . COLONOSCOPY  05-01-00  . HERNIA REPAIR  2014   Bil inguinal hernia repair  . ROOT CANAL    . TONSILLECTOMY  1958    Family History  Problem Relation Age of Onset  . Hypertension Mother   . Breast cancer Mother   . Ovarian cancer Mother        in  her 67s  . Prostate cancer Father        complications from radioactive seeds, colostomy for last 10 years    Medications- reviewed and updated Current Outpatient Medications  Medication Sig Dispense Refill  . Alpha-Lipoic Acid 200 MG TABS Take by mouth daily.    . Ascorbic Acid (VITAMIN C) 1000 MG tablet Take 1,000 mg by mouth daily. With rose hips    . B Complex Vitamins (VITAMIN B COMPLEX PO) Take by mouth.    . carbidopa-levodopa (SINEMET IR) 25-100 MG tablet 2 IN THE AM, 2 IN THE AFTERNOON, 1 IN THE EVENING 360 tablet 0  . cholecalciferol (VITAMIN D) 1000 units tablet Take 1,000 Units by mouth daily. Vit d 3- Take 125 mcg daily    . FOLIC ACID PO Take [id number] mg by mouth.     Marland Kitchen glucosamine-chondroitin 500-400 MG tablet Take 1 tablet by mouth 3 (three) times daily.    Marland Kitchen L-Lysine 500 MG TABS Take 1,000 mg by mouth daily.     . Multiple Vitamin (MULTIVITAMIN) tablet Take 1 tablet by mouth daily.    . Omega-3 Fatty Acids (FISH OIL) 1000 MG CAPS Take 1,000 mg by mouth daily.    . saw palmetto 160 MG capsule Take 450 mg by mouth daily.     Marland Kitchen  vitamin B-12 (CYANOCOBALAMIN) 1000 MCG tablet Take 1,000 mcg by mouth daily.     No current facility-administered medications for this visit.     Allergies-reviewed and updated No Known Allergies  Social History   Social History Narrative   Lives alone. Long term partner - does not live with him.       Works at the [employer]    Formerly Designer, multimedia.    Goes to [employer]      Hobbies: Exercises regularly, relax with friends   Objective  Objective:  BP 124/82   Pulse 67   Temp 98.7 F (37.1 C) (Temporal)   Ht 6' 2.5" (1.892 m)   Wt 193 lb 9.6 oz (87.8 kg)   SpO2 96%   BMI 24.52 kg/m  Gen: NAD, resting comfortably HEENT: Mask not removed due to covid 19. TM normal. Bridge of nose normal. Eyelids normal.  Neck: no thyromegaly or cervical lymphadenopathy  CV: RRR no  murmurs rubs or gallops Lungs: CTAB no crackles, wheeze, rhonchi Abdomen: soft/nontender/nondistended/normal bowel sounds. No rebound or guarding.  Ext: no edema Skin: warm, dry Neuro: grossly normal, moves all extremities, PERRLA, resting tremor noted Msk: good ROM in hip    Assessment and Plan  63 y.o. male presenting for annual physical.  Health Maintenance counseling: 1. Anticipatory guidance: Patient counseled regarding regular dental exams q6 months, eye exams - close to yearly,  avoiding smoking and second hand smoke , limiting alcohol to 2 beverages per day . Patient does not drink much at all    2. Risk factor reduction:  Advised patient of need for regular exercise and diet rich and fruits and vegetables to reduce risk of heart attack and stroke. Exercise- yoga, goes to gym for PT 1 time a week now due to covid. In the past would go three times a week. He is very active at work. Diet-eats a lot at home. Tries to cook healthy avoid beef. Does not eat a lot of sweets.  Lost 10 lbs over the year- normal BMI at this time Wt Readings from Last 3 Encounters:  11/15/18 193 lb 9.6 oz (87.8 kg)  09/25/18 194 lb 3.2 oz (88.1 kg)  03/07/18 203 lb (92.1 kg)  3. Immunizations/screenings/ancillary studies- up to date  Immunization History  Administered Date(s) Administered  . Influenza Whole 01/03/2002  . Influenza,inj,Quad PF,6+ Mos 08/19/2017, 09/04/2018  . Influenza-Unspecified 09/21/2014, 10/08/2015, 08/31/2016  . Td 10/03/1996, 11/05/2007  . Tdap 11/10/2017  . Zoster 11/18/2015  . Zoster Recombinat (Shingrix) 05/25/2016, 10/20/2016  4. Prostate cancer screening/elevated PSA- followed by urology Dr. Rosana Hoes for this  PSA and seen 10/01/2018 and forHypotestosteronemia-had been on Clomid in the past-not at this time- especially with elevated PSA  Lab Results  Component Value Date   PSA 4.91 (H) 11/10/2017   PSA 4.64 (H) 05/09/2017   PSA 5.62 01/13/2017   5. Colon cancer screening - Due  12/23/2022 History of colon polyps-sessile serrated polyp December 2019 with 5-year repeat 6. Skin cancer screening- last app in 2019. Clear no issues since that time.  advised regular sunscreen use. Denies worrisome, changing, or new skin lesions.  7. never smoker  Status of chronic or acute concerns   Parkinson's disease ([facility])-continues to follow with neurology Dr. Carles Collet and is on Sinemet. Feels he does well other than some shaking. Hip pain bothers him when sinemet wears off as below.   Left low back and primarily lateral hip pain since  about June/July. Still doing PT and that helps some. Sinemet actually helps some. When sinemet wears off he feels the pain. Aleve did not make a big difference. Has seen chiropractor in past- has not gone back since this started. He is going to try massage.  - offered [facility] referral if not improving or worsens- he would consider Dr. Tamala Julian in particular with good experience with chiropractic treatment in the past.    Low serum vitamin D-takes 1000 units vitamin D daily.  Update vitamin D today  Hyperlipidemia, unspecified hyperlipidemia type-update cholesterol in 10-year risk of heart attack or stroke per ASCVD risk estimator. No family history heart issues- mom at 51 without issues -he strongly prefers to avoid medicine unless risk gets above 20% and will focus on healthy eating/regular exercise (he works with PT regularly with parkinson's)   Anxiety state-continues to do well without medication-has been on Zoloft in the past with a tough life transition . Had 6 weeks off with covid 19 and red collection closed and he states these were an amazing 6 weeks "staycation" really happy for him. He is able to be away from the public in his role.   Recommended follow up: 1 year physical  Future Appointments  Date Time Provider [place]  03/19/2019  8:15 AM Tat, Eustace Quail, DO [facility] None   Lab/Order associations: Not fasting today only had coffee   with milk.    Return precautions advised.  [name], MD

## 2019-01-08 NOTE — Progress Notes (Signed)
Dan [name] was seen today in follow up for Parkinsons disease.  My previous records were reviewed prior to todays visit.  Outside records that were made available to me were reviewed, including Dr. Ansel Bong records from November 12.  Patient contacted me in October stating that he was having some wearing off with his medication.  I told him instead of taking carbidopa/levodopa 2/2/1 that he could take 2 tablets in the morning and then take 1 tablet at 11 AM, 1 tablet at 3 PM and 1 tablet at 7 PM, so that the medication was brought out more evenly.  Today, he reports he is still taking carbidopa/levodopa 25/100, 2 at 6am/2 at 11am and 1 at 3pm.   He called me a few weeks later to state that he was more fatigued and having hip pain.  He was told that the hip pain was unrelated to Parkinson's disease.  He was offered a virtual visit for 2 days later, but declined the visit, as he wanted to be seen in person. He does note that when working he stands on concrete floors the hip pain is worse but also notes that taking carbidopa/levodopa seems to help the hip pain.  He also notes that he has tremor in the L hand around 10 am (next pill at 11am).  He doesn't notice it as much with the 2-3 pm but it is the end of the work day and it also doesn't bother him as much.  He also is noticing tremor at night.  States that he was having trouble sleeping but got marijuana gummies from friend in [place] and it helped.  Takes prn.  Still exercising faithfully.   Pt denies falls.  Pt denies lightheadedness, near syncope.  No hallucinations.    Current prescribed movement disorder medications: Carbidopa/levodopa 25/100, 2/2/1   PREVIOUS MEDICATIONS: pramipexole (patient thought worsen depression and cause headache/nausea)  ALLERGIES:  No Known Allergies  CURRENT MEDICATIONS:  Outpatient Encounter Medications as of 01/10/2019  Medication Sig  . Alpha-Lipoic Acid 200 MG TABS Take by mouth daily.  . Ascorbic Acid (VITAMIN  C) 1000 MG tablet Take 1,000 mg by mouth daily. With rose hips  . B Complex Vitamins (VITAMIN B COMPLEX PO) Take by mouth.  . carbidopa-levodopa (SINEMET IR) 25-100 MG tablet 2 IN THE AM, 2 IN THE AFTERNOON, 1 IN THE EVENING  . cholecalciferol (VITAMIN D) 1000 units tablet Take 1,000 Units by mouth daily. Vit d 3- Take 125 mcg daily  . FOLIC ACID PO Take [id number] mg by mouth.   Marland Kitchen glucosamine-chondroitin 500-400 MG tablet Take 1 tablet by mouth 3 (three) times daily.  Marland Kitchen L-Lysine 500 MG TABS Take 1,000 mg by mouth daily.   . Multiple Vitamin (MULTIVITAMIN) tablet Take 1 tablet by mouth daily.  . Omega-3 Fatty Acids (FISH OIL) 1000 MG CAPS Take 1,000 mg by mouth daily.  . saw palmetto 160 MG capsule Take 450 mg by mouth daily.   . vitamin B-12 (CYANOCOBALAMIN) 1000 MCG tablet Take 1,000 mcg by mouth daily.   No facility-administered encounter medications on file as of 01/10/2019.    PHYSICAL EXAMINATION:    VITALS:   Vitals:   01/10/19 0808  BP: (!) 146/89  Pulse: 74  Resp: 16  SpO2: 98%  Weight: 196 lb (88.9 kg)  Height: 6' 2.5" (1.892 m)    GEN:  The patient appears stated age and is in NAD. HEENT:  Normocephalic, atraumatic.  The mucous membranes are moist. The  superficial temporal arteries are without ropiness or tenderness. CV:  RRR Lungs:  CTAB Neck/HEME:  There are no carotid bruits bilaterally.  Neurological examination:  Orientation: The patient is alert and oriented x3. Cranial nerves: There is good facial symmetry with facial hypomimia. The speech is fluent and clear. Soft palate rises symmetrically and there is no tongue deviation. Hearing is intact to conversational tone. Sensation: Sensation is intact to light touch throughout Motor: Strength is at least antigravity x4.  Movement examination: Tone: There is normal tone in the UE/LE Abnormal movements: there is intermittent LUE resting tremor Coordination:  There is no decremation with RAM's, with any form of RAMS,  including alternating supination and pronation of the forearm, hand opening and closing, finger taps, heel taps and toe taps. Gait and Station: The patient has no difficulty arising out of a deep-seated chair without the use of the hands. The patient's stride length is good with good stride length.     ASSESSMENT/PLAN:  1.  Parkinson's disease  -Discussed rotigotine patch with the patient as well as entacapone/opicapone and increasing levodopa.  He would like to try and increase the levodopa.  I think that is reasonable.  He will increase the carbidopa/levodopa 25/100 to 2 tablets at 6am/2 at 10am/1 at 2pm/1 at 6pm  -if he has trouble with tremor and sleep in the future we can consider carbidopa/levodopa CR at bed  -Needs to make a f/u with Dr. Ronnald Ramp for skin checks.  Discussed with patient today.  2.  Hip and low back pain/right lumbar radiculopathy  -Had MRI of the lumbar spine 2 years ago with just degenerative changes.  Total time spent on today's visit was greater than 30 minutes, including both face-to-face time and nonface-to-face time.  Time included that spent on review of records (prior notes available to me/labs/imaging if pertinent), discussing treatment and goals, answering patient's questions and coordinating care.  Cc:  Marin Olp, MD

## 2019-01-10 ENCOUNTER — Encounter: Payer: Self-pay | Admitting: Neurology

## 2019-01-10 ENCOUNTER — Ambulatory Visit (INDEPENDENT_AMBULATORY_CARE_PROVIDER_SITE_OTHER): Payer: BC Managed Care – PPO | Admitting: Neurology

## 2019-01-10 ENCOUNTER — Other Ambulatory Visit: Payer: Self-pay

## 2019-01-10 VITALS — BP 146/89 | HR 74 | Resp 16 | Ht 74.5 in | Wt 196.0 lb

## 2019-01-10 DIAGNOSIS — G20 Parkinson's disease: Secondary | ICD-10-CM | POA: Diagnosis not present

## 2019-01-10 MED ORDER — CARBIDOPA-LEVODOPA 25-100 MG PO TABS: ORAL_TABLET | ORAL | 1 refills | Status: DC

## 2019-01-10 NOTE — Patient Instructions (Addendum)
Increase carbidopa/levodopa 25/100, 2 tablets at 6am/2 at 10am/1 at 2pm/1 at 6pm  The physicians and staff at [facility] are committed to providing excellent care. You may receive a survey requesting feedback about your experience at our office. We strive to receive "very good" responses to the survey questions. If you feel that your experience would prevent you from giving the office a "very good " response, please contact our office to try to remedy the situation. We may be reached at [phone]. Thank you for taking the time out of your busy day to complete the survey.

## 2019-01-11 ENCOUNTER — Telehealth: Payer: Self-pay | Admitting: Neurology

## 2019-01-11 NOTE — Telephone Encounter (Signed)
Left message with after hour service on 01-11-19 @ 12:45 pm    Caller states he was seen yesterday and was given a Alternative RX for something but cant remember what it was. It was a patch

## 2019-01-11 NOTE — Telephone Encounter (Signed)
Went over his visit yesterday in detail and the medications discussed the names so that he could look into them

## 2019-01-15 ENCOUNTER — Telehealth: Payer: Self-pay | Admitting: Neurology

## 2019-01-15 NOTE — Telephone Encounter (Signed)
Spoke with Dan [name] he does not want make a change at this time he was looking up information, He wanted to know if it was a weekly patch informed its a daily patch you change.  He asked about the dosage, he was informed it started at a low dose 1 mg and then you are worked up to a higher dose,  He asked about coupon card, pt informed it depends on insurance if you can use the cards or not,

## 2019-01-15 NOTE — Telephone Encounter (Signed)
Looking at last office note I do not see the dose got the Neurpo patch? I know we have samples if he does start this medication, I will look for discount card he is asking about.

## 2019-01-15 NOTE — Telephone Encounter (Signed)
Patient is calling in about the Nupro patch and wanting to know how long it will last per patch, dosage for this (mg) and then he said insurance said we would give a coupon card for this. Do we have these? He said to call him on the cell but if you don't reach him there to try the home phone. Thanks!

## 2019-01-15 NOTE — Telephone Encounter (Signed)
We discussed the patch but he decided to go a different route and increase the levodopa instead.  Lets stay with that route for now since we just did that.

## 2019-01-31 DIAGNOSIS — L738 Other specified follicular disorders: Secondary | ICD-10-CM | POA: Diagnosis not present

## 2019-01-31 DIAGNOSIS — D225 Melanocytic nevi of trunk: Secondary | ICD-10-CM | POA: Diagnosis not present

## 2019-01-31 DIAGNOSIS — D2262 Melanocytic nevi of left upper limb, including shoulder: Secondary | ICD-10-CM | POA: Diagnosis not present

## 2019-01-31 DIAGNOSIS — D235 Other benign neoplasm of skin of trunk: Secondary | ICD-10-CM | POA: Diagnosis not present

## 2019-03-19 ENCOUNTER — Ambulatory Visit: Payer: BC Managed Care – PPO | Admitting: Neurology

## 2019-03-26 ENCOUNTER — Ambulatory Visit: Payer: BC Managed Care – PPO | Admitting: Neurology

## 2019-04-08 DIAGNOSIS — R972 Elevated prostate specific antigen [PSA]: Secondary | ICD-10-CM | POA: Diagnosis not present

## 2019-04-08 DIAGNOSIS — G20 Parkinson's disease: Secondary | ICD-10-CM | POA: Diagnosis not present

## 2019-04-08 DIAGNOSIS — Z8042 Family history of malignant neoplasm of prostate: Secondary | ICD-10-CM | POA: Diagnosis not present

## 2019-05-29 DIAGNOSIS — G20 Parkinson's disease: Secondary | ICD-10-CM | POA: Diagnosis not present

## 2019-06-05 NOTE — Progress Notes (Signed)
Assessment/Plan:   1.  Parkinsons Disease  -Carbidopa/levodopa 25/100, 2 tablets at 6 AM/2 tablets at 10 AM/2 tablet at 2 PM/2 tablet at 6 PM  -Discussed with patient that he really does not need 2 different movement disorder physicians taking care of him.  Certainly, Dr. Linus Mako is an excellent physician and I have no objection if patient feels more comfortable transferring his care, but do not think he likely needs two of [place].  -saw dermatology.  We discussed that it used to be thought that levodopa would increase risk of melanoma but now it is believed that Parkinsons itself likely increases risk of melanoma. he is to get regular skin checks.  2.  Sciatica  -resolved for now  Subjective:   Dan [name] was seen today in follow up for Parkinsons disease.  My previous records were reviewed prior to todays visit as well as outside records available to me. Last visit, we increased his levodopa (after discussing other options such as entacapone/opicapone).   pt denies falls and still working out at [employer].  Reports he has had more issues with fatigue but he is working more and taking care of his mom more.  Pt denies lightheadedness, near syncope.  No hallucinations.  Mood has been good but "its hard to see your mother get old." As above, records have been reviewed since last visit. The patient just saw Dr. Linus Mako at [facility] on May 26.  At that point in time, they discussed Rytary, but notes indicate they decided to increase levodopa to a total of 9 pills/day.  No longer having sciatica pain  Current prescribed movement disorder medications: Carbidopa/levodopa 25/100, 2 tablets at 6 AM/2 tablets at 10 AM/1 tablet at 2 PM (but patient taking 2 at this dose)/1 tablet at 6 PM (pt taking 2 at this dose)  PREVIOUS MEDICATIONS: pramipexole (patient thought worsen depression and cause headache/nausea)  ALLERGIES:  No Known Allergies  CURRENT MEDICATIONS:  Outpatient Encounter Medications as of  06/07/2019  Medication Sig   Alpha-Lipoic Acid 200 MG TABS Take by mouth daily.   Ascorbic Acid (VITAMIN C) 1000 MG tablet Take 1,000 mg by mouth daily. With rose hips   B Complex Vitamins (VITAMIN B COMPLEX PO) Take by mouth.   carbidopa-levodopa (SINEMET IR) 25-100 MG tablet 2 tablets at 6am/2 at 10am/1 at 2pm/1 at 6pm (Patient taking differently: 2 tablets at 6am/2 at 10am/2 at 2pm/ occasionally will take 1 at hs)   cholecalciferol (VITAMIN D) 1000 units tablet Take 1,000 Units by mouth daily. Vit d 3- Take 0000000 mcg daily   FOLIC ACID PO Take [id number] mg by mouth.    glucosamine-chondroitin 500-400 MG tablet Take 1 tablet by mouth 3 (three) times daily.   L-Lysine 500 MG TABS Take 1,000 mg by mouth daily.    Multiple Vitamin (MULTIVITAMIN) tablet Take 1 tablet by mouth daily.   Omega-3 Fatty Acids (FISH OIL) 1000 MG CAPS Take 1,000 mg by mouth daily.   saw palmetto 160 MG capsule Take 450 mg by mouth daily.    vitamin B-12 (CYANOCOBALAMIN) 1000 MCG tablet Take 1,000 mcg by mouth daily.   No facility-administered encounter medications on file as of 06/07/2019.    Objective:   PHYSICAL EXAMINATION:    VITALS:   Vitals:   06/07/19 0802  BP: (!) 150/88  Pulse: 75  Resp: 18  SpO2: 95%  Weight: 196 lb (88.9 kg)  Height: 6\' 2"  (1.88 m)    GEN:  The patient  appears stated age and is in NAD. HEENT:  Normocephalic, atraumatic.  The mucous membranes are moist. The superficial temporal arteries are without ropiness or tenderness. CV:  RRR Lungs:  CTAB Neck/HEME:  There are no carotid bruits bilaterally.  Neurological examination:  Orientation: The patient is alert and oriented x3. Cranial nerves: There is good facial symmetry with facial hypomimia. The speech is fluent and clear. Soft palate rises symmetrically and there is no tongue deviation. Hearing is intact to conversational tone. Sensation: Sensation is intact to light touch throughout Motor: Strength is at least  antigravity x4.  Movement examination: Tone: There is normal tone in the UE/LE Abnormal movements: there is intermittent LUE rest tremor Coordination:  There is no decremation with RAM's, with any form of RAMS, including alternating supination and pronation of the forearm, hand opening and closing, finger taps, heel taps and toe taps. Gait and Station: The patient has no difficulty arising out of a deep-seated chair without the use of the hands. The patient's stride length is good.  The patient has a neg pull test.     I have reviewed and interpreted the following labs independently    Chemistry      Component Value Date/Time   NA 139 11/15/2018 0902   K 4.1 11/15/2018 0902   CL 102 11/15/2018 0902   CO2 30 11/15/2018 0902   BUN 16 11/15/2018 0902   CREATININE 0.89 11/15/2018 0902      Component Value Date/Time   CALCIUM 9.4 11/15/2018 0902   ALKPHOS 48 11/15/2018 0902   AST 13 11/15/2018 0902   ALT 5 11/15/2018 0902   BILITOT 0.8 11/15/2018 0902       Lab Results  Component Value Date   WBC 5.3 11/15/2018   HGB 14.6 11/15/2018   HCT 43.8 11/15/2018   MCV 95.6 11/15/2018   PLT 182.0 11/15/2018    Lab Results  Component Value Date   TSH 0.99 05/09/2017     Total time spent on today's visit was 30 minutes, including both face-to-face time and nonface-to-face time.  Time included that spent on review of records (prior notes available to me/labs/imaging if pertinent), discussing treatment and goals, answering patient's questions and coordinating care.  Cc:  Marin Olp, MD

## 2019-06-07 ENCOUNTER — Ambulatory Visit: Payer: BC Managed Care – PPO | Admitting: Neurology

## 2019-06-07 ENCOUNTER — Encounter: Payer: Self-pay | Admitting: Neurology

## 2019-06-07 ENCOUNTER — Other Ambulatory Visit: Payer: Self-pay

## 2019-06-07 VITALS — BP 150/88 | HR 75 | Resp 18 | Ht 74.0 in | Wt 196.0 lb

## 2019-06-07 DIAGNOSIS — G20 Parkinson's disease: Secondary | ICD-10-CM | POA: Diagnosis not present

## 2019-06-07 NOTE — Patient Instructions (Addendum)
You look great!  No changes in your medications.  The physicians and staff at [facility] are committed to providing excellent care. You may receive a survey requesting feedback about your experience at our office. We strive to receive "very good" responses to the survey questions. If you feel that your experience would prevent you from giving the office a "very good " response, please contact our office to try to remedy the situation. We may be reached at [phone]. Thank you for taking the time out of your busy day to complete the survey.

## 2019-07-04 ENCOUNTER — Other Ambulatory Visit: Payer: Self-pay | Admitting: Neurology

## 2019-07-04 NOTE — Telephone Encounter (Signed)
Patient called in needing a refill of his carbidopa-levodopa [facility] [employer] [facility] [address].

## 2019-07-04 NOTE — Telephone Encounter (Signed)
Spoke with pt about carbodopa/levodopa refill request. He states he has been taking 2 tabs @ 6am/2 @ 9am/2 @ 12pm/2 @ 3pm/1 @ 6pm. Inst that a new script will be sent with this dosing schedule.

## 2019-07-04 NOTE — Telephone Encounter (Signed)
Rx(s) sent to pharmacy electronically.  

## 2019-07-05 ENCOUNTER — Other Ambulatory Visit: Payer: Self-pay | Admitting: Neurology

## 2019-07-05 NOTE — Telephone Encounter (Signed)
Patient called in wanting a refill of his carbidopa-levodopa

## 2019-07-05 NOTE — Telephone Encounter (Signed)
Patient wants to check the status of his refill of his on the Carbidopa levodopa. He is worried that it he will be out of medication and the holiday weekend is coming up [facility] [address] [facility] [address]

## 2019-07-05 NOTE — Telephone Encounter (Signed)
Spoke with pt 07/04/19 and he states that he is taking carbidopa/levodopa 2 @ 6AM/2 @ 9AM/ 2 @ 12PM/2 @ 3PM/1 @ 6PM. Requested refill for 9 tablets/day.

## 2019-07-05 NOTE — Telephone Encounter (Signed)
Contacted pharmacy and was informed that they did receive the rx that was sent over this morning. Pharmacist stated they will get the to the refill when they can because they are busy.   Patient notiifed that pharmacy did receive his rx and they will fill it when they can. Patients states he will pick up medication when he gets off work.

## 2019-11-11 DIAGNOSIS — Z8042 Family history of malignant neoplasm of prostate: Secondary | ICD-10-CM | POA: Diagnosis not present

## 2019-11-11 DIAGNOSIS — G20 Parkinson's disease: Secondary | ICD-10-CM | POA: Diagnosis not present

## 2019-11-11 DIAGNOSIS — R972 Elevated prostate specific antigen [PSA]: Secondary | ICD-10-CM | POA: Diagnosis not present

## 2019-12-03 NOTE — Progress Notes (Signed)
Assessment/Plan:   1.  Parkinsons Disease  -Continue carbidopa/levodopa 25/100, 2 tablets 5  times per day (6am/9am/noon/3pm/5pm).  He really looks well controlled.  Can considered opicapone in the future if needed.    -Follows yearly with dermatology.  Understands Parkinson's slightly increases risk for melanoma.  Sees Dr. Ronnald Ramp  -has stress of aging mom.  Discussed counseling but doing well with friend support.  2.  Sciatica, intermittent  -Doing well from the standpoint right now.  3.  Elevated PSA  -Following with neurology.  Awaiting MRI prostate. Subjective:   Dan [name] was seen today in follow up for Parkinsons disease.  My previous records were reviewed prior to todays visit as well as outside records available to me. Pt denies falls.  He has been caregiving for his mom who had a fall and that has fallen and that has been mentally draining.  He is fatigued from that, mentally and physically.  He is falling asleep well.  Wakes up at 3am to use RR and some trouble falling back asleep due to mind racing.   He knows if he is late for med in the daytime, tremor increases.   Pt denies lightheadedness, near syncope.  No hallucinations.   Still going to [facility] on tuesdays.  Saw urology on November 8.  Records are reviewed.  MRI of the prostate was recommended, although his PSA was actually stable at this time.  Current prescribed movement disorder medications: Carbidopa/levodopa 25/100, 2 tablets at 6 AM/2 tablets at 10 AM/2 tablets at 2 PM/2 tablets at 6 PM (pt states he is now taking this 5 times per day)   PREVIOUS MEDICATIONS: pramipexole (patient thought worsen depression and cause headache/nausea)  ALLERGIES:  No Known Allergies  CURRENT MEDICATIONS:  Outpatient Encounter Medications as of 12/09/2019  Medication Sig  . Alpha-Lipoic Acid 200 MG TABS Take by mouth daily.   . Ascorbic Acid (VITAMIN C) 1000 MG tablet Take 1,000 mg by mouth daily. With rose hips  . B Complex  Vitamins (VITAMIN B COMPLEX PO) Take by mouth daily.   . carbidopa-levodopa (SINEMET IR) 25-100 MG tablet 2 @ 6AM/2 @ 9AM/2 @ 12PM/2 @ 3PM/1 @ 6PM (Patient taking differently: 2 @ 6AM/2 @ 9AM/2 @ 12PM/2 @ 3PM/2 @ 6PM)  . cholecalciferol (VITAMIN D) 1000 units tablet Take 1,000 Units by mouth daily. Vit d 3- Take 125 mcg daily  . FOLIC ACID PO Take 485 mg by mouth.   Marland Kitchen glucosamine-chondroitin 500-400 MG tablet Take 1 tablet by mouth daily.   Marland Kitchen L-Lysine 500 MG TABS Take 1,000 mg by mouth daily.   . Multiple Vitamin (MULTIVITAMIN) tablet Take 1 tablet by mouth daily.  . Omega-3 Fatty Acids (FISH OIL) 1000 MG CAPS Take 1,000 mg by mouth daily.  . saw palmetto 160 MG capsule Take 450 mg by mouth daily.   . vitamin B-12 (CYANOCOBALAMIN) 1000 MCG tablet Take 1,000 mcg by mouth daily.   No facility-administered encounter medications on file as of 12/09/2019.    Objective:   PHYSICAL EXAMINATION:    VITALS:   Vitals:   12/09/19 0813  BP: 133/77  Pulse: 70  SpO2: 97%  Weight: 196 lb (88.9 kg)  Height: 6\' 3"  (1.905 m)    GEN:  The patient appears stated age and is in NAD. HEENT:  Normocephalic, atraumatic.  The mucous membranes are moist. The superficial temporal arteries are without ropiness or tenderness. CV:  RRR Lungs:  CTAB Neck/HEME:  There are no  carotid bruits bilaterally.  Neurological examination:  Orientation: The patient is alert and oriented x3. Cranial nerves: There is good facial symmetry without facial hypomimia. The speech is fluent and clear. Soft palate rises symmetrically and there is no tongue deviation. Hearing is intact to conversational tone. Sensation: Sensation is intact to light touch throughout Motor: Strength is at least antigravity x4.  Movement examination: Tone: There is nl tone in the UE/LE Abnormal movements: Rare LUE tremor Coordination:  There is no decremation with RAM's, with any form of RAMS, including alternating supination and pronation of the  forearm, hand opening and closing, finger taps, heel taps and toe taps. Gait and Station: The patient has no difficulty arising out of a deep-seated chair without the use of the hands. The patient's stride length is good with neg pull test.    I have reviewed and interpreted the following labs independently    Chemistry      Component Value Date/Time   NA 142 12/05/2019 0901   K 4.3 12/05/2019 0901   CL 106 12/05/2019 0901   CO2 25 12/05/2019 0901   BUN 18 12/05/2019 0901   CREATININE 0.92 12/05/2019 0901      Component Value Date/Time   CALCIUM 9.3 12/05/2019 0901   ALKPHOS 48 11/15/2018 0902   AST 16 12/05/2019 0901   ALT 8 (L) 12/05/2019 0901   BILITOT 0.8 12/05/2019 0901       Lab Results  Component Value Date   WBC 5.7 12/05/2019   HGB 14.6 12/05/2019   HCT 43.4 12/05/2019   MCV 93.5 12/05/2019   PLT 180 12/05/2019    Lab Results  Component Value Date   TSH 0.99 05/09/2017     Total time spent on today's visit was 30 minutes, including both face-to-face time and nonface-to-face time.  Time included that spent on review of records (prior notes available to me/labs/imaging if pertinent), discussing treatment and goals, answering patient's questions and coordinating care.  Cc:  Marin Olp, MD

## 2019-12-04 NOTE — Progress Notes (Signed)
Phone: [phone]   Subjective:  Patient presents today for their annual physical. Chief complaint-noted.   See problem oriented charting- ROS- full  review of systems was completed and negative  except for: fatigue, tinnitus, tremors, left hip pain  The following were reviewed and entered/updated in epic: Past Medical History:  Diagnosis Date  . Allergy    seasonal  . Anxiety   . Bleeding hemorrhoid 05/24/2010  . Depression 08/11/2006   Anxiety as well around down market- tough time with job loss in [employer]    . Hyperlipidemia LDL goal < 130 05/24/2010  . Irritable bowel syndrome 08/29/2007   Improved with diet change    . Parkinson disease ([place])    since 2016   Patient Active Problem List   Diagnosis Date Noted  . Hypotestosteronemia 11/05/2015    Priority: High  . Elevated PSA 10/30/2014    Priority: High  . Parkinsonism ([place]) 10/30/2014    Priority: High  . Low serum vitamin D 04/16/2015    Priority: Medium  . Hyperlipidemia 05/24/2010    Priority: Medium  . Anxiety state 08/11/2006    Priority: Medium  . Bleeding hemorrhoid 05/24/2010    Priority: Low  . Irritable bowel syndrome 08/29/2007    Priority: Low  . History of colon polyps 01/08/2018   Past Surgical History:  Procedure Laterality Date  . APPENDECTOMY    . COLONOSCOPY  05-01-00  . HERNIA REPAIR  2014   Bil inguinal hernia repair  . ROOT CANAL    . TONSILLECTOMY  1958    Family History  Problem Relation Age of Onset  . Hypertension Mother   . Breast cancer Mother   . Ovarian cancer Mother        in her 47s  . Prostate cancer Father        complications from radioactive seeds, colostomy for last 10 years    Medications- reviewed and updated Current Outpatient Medications  Medication Sig Dispense Refill  . Ascorbic Acid (VITAMIN C) 1000 MG tablet Take 1,000 mg by mouth daily. With rose hips    . B Complex Vitamins (VITAMIN B COMPLEX PO) Take by mouth.    . carbidopa-levodopa  (SINEMET IR) 25-100 MG tablet 2 @ 6AM/2 @ 9AM/2 @ 12PM/2 @ 3PM/1 @ 6PM 270 tablet 0  . cholecalciferol (VITAMIN D) 1000 units tablet Take 1,000 Units by mouth daily. Vit d 3- Take 125 mcg daily    . FOLIC ACID PO Take 299 mg by mouth.     Marland Kitchen glucosamine-chondroitin 500-400 MG tablet Take 1 tablet by mouth daily.     Marland Kitchen L-Lysine 500 MG TABS Take 1,000 mg by mouth daily.     . Multiple Vitamin (MULTIVITAMIN) tablet Take 1 tablet by mouth daily.    . Omega-3 Fatty Acids (FISH OIL) 1000 MG CAPS Take 1,000 mg by mouth daily.    . saw palmetto 160 MG capsule Take 450 mg by mouth daily.     . vitamin B-12 (CYANOCOBALAMIN) 1000 MCG tablet Take 1,000 mcg by mouth daily.    . Alpha-Lipoic Acid 200 MG TABS Take by mouth daily. (Patient not taking: Reported on 12/05/2019)     No current facility-administered medications for this visit.    Allergies-reviewed and updated No Known Allergies  Social History   Social History Narrative   Lives alone. Long term partner - does not live with him.       Works at the [employer]  Formerly Designer, multimedia.    Goes to [employer]      Hobbies: Exercises regularly, relax with friends   Objective  Objective:  BP 120/84   Pulse 65   Temp 99.2 F (37.3 C) (Temporal)   Ht 6\' 2"  (1.88 m)   Wt 194 lb 3.2 oz (88.1 kg)   SpO2 97%   BMI 24.93 kg/m  Gen: NAD, resting comfortably HEENT: Mucous membranes are moist. Oropharynx normal Neck: no thyromegaly CV: RRR no murmurs rubs or gallops Lungs: CTAB no crackles, wheeze, rhonchi Abdomen: soft/nontender/nondistended/normal bowel sounds. No rebound or guarding.  Ext: no edema Skin: warm, dry Neuro: grossly normal, moves all extremities, PERRLA    Assessment and Plan  64 y.o. male presenting for annual physical.  Health Maintenance counseling: 1. Anticipatory guidance: Patient counseled regarding regular dental exams -q6 months, eye exams - close to  yearly ,  avoiding smoking and second hand smoke , limiting alcohol to 2 beverages per day -- beer twice a week .   2. Risk factor reduction:  Advised patient of need for regular exercise and diet rich and fruits and vegetables to reduce risk of heart attack and stroke. Exercise- Gym Tuesday with personal trainer- having to care for mom limits him otherwise- would like to increase if those demands slow down . Diet- cooks at home and tries to eat reasonably healthy- minimizes beef and sweets- weight stable over last year.  Wt Readings from Last 3 Encounters:  12/05/19 194 lb 3.2 oz (88.1 kg)  06/07/19 196 lb (88.9 kg)  01/10/19 196 lb (88.9 kg)  3. Immunizations/screenings/ancillary studies- covid 19 - he will send [place] dates, otherwise up to date Immunization History  Administered Date(s) Administered  . Influenza Whole 01/03/2002  . Influenza,inj,Quad PF,6+ Mos 08/19/2017, 09/04/2018  . Influenza-Unspecified 09/21/2014, 10/08/2015, 08/31/2016, 10/04/2019  . PFIZER SARS-COV-2 Vaccination 10/15/2019  . Td 10/03/1996, 11/05/2007  . Tdap 11/10/2017  . Zoster 11/18/2015  . Zoster Recombinat (Shingrix) 05/25/2016, 10/20/2016  4. Prostate cancer screening-   follws by urology Dr. Rosana Hoes for prior elevated PSA and low testosterone- had been on clomid in past- off right now. Saw Dr. Rosana Hoes 2 weeks ago- no PSA change but plan is to do MRI with dad's history of prostate cancer Lab Results  Component Value Date   PSA 4.91 (H) 11/10/2017   PSA 4.64 (H) 05/09/2017   PSA 5.62 01/13/2017   5. Colon cancer screening -  due 12/23/22 with hx sessile serrated polyp dec 2019  6. Skin cancer screening- [place] derm Dr. Ronnald Ramp. advised regular sunscreen use. Denies worrisome, changing, or new skin lesions.  7. never smoker 8. STD screening - monogamous with long term partner  Status of chronic or acute concerns   # Parkinson's disease- continues close follow up with Dr. Carles Collet and remains on Sinemet. Continues to do  well- still with some tremors. Has to really watch the spacing of meds- if spaces out at all symptoms worsen. Still working 3 days a week. Has upcoming visit on monday   # Left low back pain and left hip pain - has seen chiropractor in past. Seems to not be as bad so wants to monitor.   # vitamin D deficiency- takes 1000 units daily. Update vitamin d today    #hyperlipidemia- no family history CAD S: Medication: Omega 1000Mg . Wants to avoid statin unless ascvd risk above 20%- based on last years labs 10.2% Lab Results  Component Value Date   CHOL  182 11/15/2018   HDL 47.70 11/15/2018   LDLCALC 116 (H) 11/15/2018   TRIG 92.0 11/15/2018   CHOLHDL 4 11/15/2018   A/P:  update lipids and ascvd risk- remain off statin per his preference unless risk gets above 20%- he would like to possibly consider coronary calcium scoring  # Anxiety S:Medication:  None- had bene on zoloft in the past  A/P: reasonable control without meds- continue to monitor   Recommended follow up: Return in about 1 year (around 12/04/2020) for physical or sooner if needed. Future Appointments  Date Time Provider [place]  12/09/2019  8:15 AM Tat, Eustace Quail, DO [facility] None   Lab/Order associations:not  fasting   ICD-10-CM   1. Preventative health care  O96.29 COMPLETE METABOLIC PANEL WITH GFR    Lipid panel    CBC with Differential/Platelet    VITAMIN D 25 Hydroxy (Vit-D Deficiency, Fractures)  2. Anxiety state  F41.1   3. Hyperlipidemia, unspecified hyperlipidemia type  B28.4 COMPLETE METABOLIC PANEL WITH GFR    Lipid panel    CBC with Differential/Platelet  4. Low serum vitamin D  R79.89 VITAMIN D 25 Hydroxy (Vit-D Deficiency, Fractures)   Return precautions advised.  [name], MD

## 2019-12-04 NOTE — Patient Instructions (Addendum)
Please stop by lab before you go If you have mychart- we will send your results within 3 business days of [place] receiving them.  If you do not have mychart- we will call you about results within 5 business days of [place] receiving them.  *please note we are currently using [facility] which has a longer processing time than [place] typically so labs may not come back as quickly as in the past *please also note that you will see labs on mychart as soon as they post. I will later go in and write notes on them- will say "notes from Dr. Yong Channel"  Health Maintenance Due  Topic Date Due  . COVID-19 Vaccine (2 - Pfizer 2-dose series)- call us back with all your dates and we will update cpu 11/05/2019

## 2019-12-05 ENCOUNTER — Encounter: Payer: Self-pay | Admitting: Family Medicine

## 2019-12-05 ENCOUNTER — Other Ambulatory Visit: Payer: Self-pay

## 2019-12-05 ENCOUNTER — Ambulatory Visit (INDEPENDENT_AMBULATORY_CARE_PROVIDER_SITE_OTHER): Payer: BC Managed Care – PPO | Admitting: Family Medicine

## 2019-12-05 VITALS — BP 120/84 | HR 65 | Temp 99.2°F | Ht 74.0 in | Wt 194.2 lb

## 2019-12-05 DIAGNOSIS — R7989 Other specified abnormal findings of blood chemistry: Secondary | ICD-10-CM

## 2019-12-05 DIAGNOSIS — F411 Generalized anxiety disorder: Secondary | ICD-10-CM | POA: Diagnosis not present

## 2019-12-05 DIAGNOSIS — Z0000 Encounter for general adult medical examination without abnormal findings: Secondary | ICD-10-CM | POA: Diagnosis not present

## 2019-12-05 DIAGNOSIS — E785 Hyperlipidemia, unspecified: Secondary | ICD-10-CM

## 2019-12-06 ENCOUNTER — Telehealth: Payer: Self-pay

## 2019-12-06 LAB — COMPLETE METABOLIC PANEL WITH GFR
AG Ratio: 1.8 (calc) (ref 1.0–2.5)
ALT: 8 U/L — ABNORMAL LOW (ref 9–46)
AST: 16 U/L (ref 10–35)
Albumin: 4.2 g/dL (ref 3.6–5.1)
Alkaline phosphatase (APISO): 50 U/L (ref 35–144)
BUN: 18 mg/dL (ref 7–25)
CO2: 25 mmol/L (ref 20–32)
Calcium: 9.3 mg/dL (ref 8.6–10.3)
Chloride: 106 mmol/L (ref 98–110)
Creat: 0.92 mg/dL (ref 0.70–1.25)
GFR, Est African American: 102 mL/min/{1.73_m2} (ref >=60)
GFR, Est Non African American: 88 mL/min/{1.73_m2} (ref >=60)
Globulin: 2.4 g/dL (calc) (ref 1.9–3.7)
Glucose, Bld: 90 mg/dL (ref 65–99)
Potassium: 4.3 mmol/L (ref 3.5–5.3)
Sodium: 142 mmol/L (ref 135–146)
Total Bilirubin: 0.8 mg/dL (ref 0.2–1.2)
Total Protein: 6.6 g/dL (ref 6.1–8.1)

## 2019-12-06 LAB — CBC WITH DIFFERENTIAL/PLATELET
Absolute Monocytes: 542 cells/uL (ref 200–950)
Basophils Absolute: 40 cells/uL (ref 0–200)
Basophils Relative: 0.7 %
Eosinophils Absolute: 68 cells/uL (ref 15–500)
Eosinophils Relative: 1.2 %
HCT: 43.4 % (ref 38.5–50.0)
Hemoglobin: 14.6 g/dL (ref 13.2–17.1)
Lymphs Abs: 1642 cells/uL (ref 850–3900)
MCH: 31.5 pg (ref 27.0–33.0)
MCHC: 33.6 g/dL (ref 32.0–36.0)
MCV: 93.5 fL (ref 80.0–100.0)
MPV: 10 fL (ref 7.5–12.5)
Monocytes Relative: 9.5 %
Neutro Abs: 3409 cells/uL (ref 1500–7800)
Neutrophils Relative %: 59.8 %
Platelets: 180 10*3/uL (ref 140–400)
RBC: 4.64 10*6/uL (ref 4.20–5.80)
RDW: 12.3 % (ref 11.0–15.0)
Total Lymphocyte: 28.8 %
WBC: 5.7 10*3/uL (ref 3.8–10.8)

## 2019-12-06 LAB — VITAMIN D 25 HYDROXY (VIT D DEFICIENCY, FRACTURES): Vit D, 25-Hydroxy: 44 ng/mL (ref 30–100)

## 2019-12-06 LAB — LIPID PANEL
Cholesterol: 187 mg/dL (ref <200)
HDL: 50 mg/dL (ref >=40)
LDL Cholesterol (Calc): 113 mg/dL (calc) — ABNORMAL HIGH
Non-HDL Cholesterol (Calc): 137 mg/dL (calc) — ABNORMAL HIGH (ref <130)
Total CHOL/HDL Ratio: 3.7 (calc) (ref <5.0)
Triglycerides: 129 mg/dL (ref <150)

## 2019-12-06 NOTE — Telephone Encounter (Signed)
Patient received  Pfizer vaccines  1st dose  03/19/19  2nd [id number]  booster [id number]

## 2019-12-06 NOTE — Telephone Encounter (Signed)
Chart has been updated.

## 2019-12-09 ENCOUNTER — Encounter: Payer: Self-pay | Admitting: Neurology

## 2019-12-09 ENCOUNTER — Other Ambulatory Visit: Payer: Self-pay

## 2019-12-09 ENCOUNTER — Ambulatory Visit: Payer: BC Managed Care – PPO | Admitting: Neurology

## 2019-12-09 VITALS — BP 133/77 | HR 70 | Ht 75.0 in | Wt 196.0 lb

## 2019-12-09 DIAGNOSIS — R972 Elevated prostate specific antigen [PSA]: Secondary | ICD-10-CM

## 2019-12-09 DIAGNOSIS — G20 Parkinson's disease: Secondary | ICD-10-CM

## 2019-12-09 MED ORDER — CARBIDOPA-LEVODOPA 25-100 MG PO TABS
2.0000 | ORAL_TABLET | Freq: Every day | ORAL | 1 refills | Status: DC
Start: 2019-12-09 — End: 2019-12-18

## 2019-12-09 NOTE — Patient Instructions (Signed)
You look great!  No changes in medication!  The physicians and staff at [facility] are committed to providing excellent care. You may receive a survey requesting feedback about your experience at our office. We strive to receive "very good" responses to the survey questions. If you feel that your experience would prevent you from giving the office a "very good " response, please contact our office to try to remedy the situation. We may be reached at [phone]. Thank you for taking the time out of your busy day to complete the survey.

## 2019-12-18 ENCOUNTER — Other Ambulatory Visit: Payer: Self-pay | Admitting: Neurology

## 2019-12-18 NOTE — Telephone Encounter (Signed)
Rx(s) sent to pharmacy electronically.  

## 2020-01-30 DIAGNOSIS — D2262 Melanocytic nevi of left upper limb, including shoulder: Secondary | ICD-10-CM | POA: Diagnosis not present

## 2020-01-30 DIAGNOSIS — L570 Actinic keratosis: Secondary | ICD-10-CM | POA: Diagnosis not present

## 2020-01-30 DIAGNOSIS — D225 Melanocytic nevi of trunk: Secondary | ICD-10-CM | POA: Diagnosis not present

## 2020-01-30 DIAGNOSIS — D1801 Hemangioma of skin and subcutaneous tissue: Secondary | ICD-10-CM | POA: Diagnosis not present

## 2020-01-30 DIAGNOSIS — D2261 Melanocytic nevi of right upper limb, including shoulder: Secondary | ICD-10-CM | POA: Diagnosis not present

## 2020-04-16 ENCOUNTER — Telehealth: Payer: Self-pay | Admitting: Neurology

## 2020-04-16 NOTE — Telephone Encounter (Signed)
I got patient sch for 04-23-20 at 8:15 to see Dr Tat

## 2020-04-16 NOTE — Telephone Encounter (Signed)
Patient called in stating his carbidopa-levodopa is not working as well as it has in the past years. When the medicine wears off the shaking is more pronounced. He has even had to move up his dosages because it isn't lasting the whole 3 hours. He has not fallen.

## 2020-04-16 NOTE — Telephone Encounter (Signed)
Tell him we will get him added to the cancellation list (unless you already know of a sooner appt) and get him in to discuss

## 2020-04-21 NOTE — Progress Notes (Signed)
Assessment/Plan:   1.  Parkinsons Disease  -Continue carbidopa/levodopa 25/100, 2 tablets 5  times per day (6am/9am/noon/3pm/5pm).  Pt shown good RX and told to use this for levodopa.  He is paying a lot of money right now for this med.  -Discussed opicapone.  Discussed rotigitine.  Both of these could be issues in the future with [facility] but he doesn't have it right now.  After discussion of r/b/se, decided to try rotigitine patch, 2 mg x 1 week and then work to 4 mg daily.  Samples given.  Told to print copay card.  R/B/SE were discussed.  The opportunity to ask questions was given and they were answered to the best of my ability.  The patient expressed understanding and willingness to follow the outlined treatment protocols.   2.  Sciatica, intermittent  -Doing well from the standpoint right now.  3.  Constipation  -discussed nature and pathophysiology and association with PD  -discussed importance of hydration.  Pt is to increase water intake  -recommended daily colace  -recommended miralax prn  Subjective:   Dan [name] was seen today in follow up for Parkinsons disease.  My previous records were reviewed prior to todays visit as well as outside records available to me. Pt denies falls.  He has called since our last visit with increasing tremor, which resulted in today's appointment.  The tremor is most noticeable when medication is wearing off.  However, medication may wear off before the 3-hour interval when he is due to take his next medication.  Med usually lasts about 2.5 intervals.  States that when he called for the appt, he was having dental issues and was on amoxil and was having myalgias.  Once the amoxil was done, he did better (he was thinking it was from the [place]).  Pt denies lightheadedness, near syncope.  No hallucinations.   Still going to ACT but now going 2 days per week, tues/thurs.  Admits to stress with aging mom.   Occ constipation but able to manage  with constipation.  Current prescribed movement disorder medications: Carbidopa/levodopa 25/100,  2 tablets 5  times per day (6am/9am/noon/3pm/5pm)   PREVIOUS MEDICATIONS: pramipexole (patient thought worsen depression and cause headache/nausea)  ALLERGIES:  No Known Allergies  CURRENT MEDICATIONS:  Outpatient Encounter Medications as of 04/23/2020  Medication Sig  . Alpha-Lipoic Acid 200 MG TABS Take by mouth daily.   . Ascorbic Acid (VITAMIN C) 1000 MG tablet Take 1,000 mg by mouth daily. With rose hips  . B Complex Vitamins (VITAMIN B COMPLEX PO) Take by mouth daily.   . carbidopa-levodopa (SINEMET IR) 25-100 MG tablet 2 TABLETS AT 6AM/2 AT 9AM/2 AT 12PM/2 AT 3PM/1 AT 6PM (Patient taking differently: 2 tabs a 6am,2 tabs at 9am,2 tab at 12, 2at 3pm, 2tabs at 6pm)  . cholecalciferol (VITAMIN D) 1000 units tablet Take 1,000 Units by mouth daily. Vit d 3- Take 125 mcg daily  . FOLIC ACID PO Take 665 mg by mouth.   Marland Kitchen glucosamine-chondroitin 500-400 MG tablet Take 1 tablet by mouth daily.   Marland Kitchen L-Lysine 500 MG TABS Take 1,000 mg by mouth daily.   . Multiple Vitamin (MULTIVITAMIN) tablet Take 1 tablet by mouth daily.  . Omega-3 Fatty Acids (FISH OIL) 1000 MG CAPS Take 1,000 mg by mouth daily.  . saw palmetto 160 MG capsule Take 450 mg by mouth daily.   . vitamin B-12 (CYANOCOBALAMIN) 1000 MCG tablet Take 1,000 mcg by mouth daily.  No facility-administered encounter medications on file as of 04/23/2020.    Objective:   PHYSICAL EXAMINATION:    VITALS:   Vitals:   04/23/20 0757  BP: 120/80  Pulse: 72  SpO2: 98%  Weight: 195 lb (88.5 kg)  Height: 6\' 3"  (1.905 m)    GEN:  The patient appears stated age and is in NAD. HEENT:  Normocephalic, atraumatic.  The mucous membranes are moist.   Neurological examination:  Orientation: The patient is alert and oriented x3. Cranial nerves: There is good facial symmetry without facial hypomimia. The speech is fluent and clear. Soft palate  rises symmetrically and there is no tongue deviation. Hearing is intact to conversational tone. Sensation: Sensation is intact to light touch throughout Motor: Strength is at least antigravity x4.  Movement examination: Tone: There is nl tone in the UE/LE Abnormal movements: he has fairly consistent LUE rest tremor, mild to mod amplitude.  He has rare RUE tremor Coordination:  There is min decremation with RAMs only with toe taps on the L.  All other RAMs are good and WNL Gait and Station: The patient has no difficulty arising out of a deep-seated chair without the use of the hands. The patient's stride length is good with mild decreased arm swing on the L  I have reviewed and interpreted the following labs independently    Chemistry      Component Value Date/Time   NA 142 12/05/2019 0901   K 4.3 12/05/2019 0901   CL 106 12/05/2019 0901   CO2 25 12/05/2019 0901   BUN 18 12/05/2019 0901   CREATININE 0.92 12/05/2019 0901      Component Value Date/Time   CALCIUM 9.3 12/05/2019 0901   ALKPHOS 48 11/15/2018 0902   AST 16 12/05/2019 0901   ALT 8 (L) 12/05/2019 0901   BILITOT 0.8 12/05/2019 0901       Lab Results  Component Value Date   WBC 5.7 12/05/2019   HGB 14.6 12/05/2019   HCT 43.4 12/05/2019   MCV 93.5 12/05/2019   PLT 180 12/05/2019    Lab Results  Component Value Date   TSH 0.99 05/09/2017     Total time spent on today's visit was 35 minutes, including both face-to-face time and nonface-to-face time.  Time included that spent on review of records (prior notes available to me/labs/imaging if pertinent), discussing treatment and goals, answering patient's questions and coordinating care.  Cc:  Marin Olp, MD

## 2020-04-23 ENCOUNTER — Other Ambulatory Visit: Payer: Self-pay

## 2020-04-23 ENCOUNTER — Encounter: Payer: Self-pay | Admitting: Neurology

## 2020-04-23 ENCOUNTER — Ambulatory Visit: Payer: BC Managed Care – PPO | Admitting: Neurology

## 2020-04-23 VITALS — BP 120/80 | HR 72 | Ht 75.0 in | Wt 195.0 lb

## 2020-04-23 DIAGNOSIS — G20 Parkinson's disease: Secondary | ICD-10-CM

## 2020-04-23 MED ORDER — ROTIGOTINE 4 MG/24HR TD PT24: 1.0000 | MEDICATED_PATCH | Freq: Every day | TRANSDERMAL | 5 refills | Status: DC

## 2020-04-23 NOTE — Patient Instructions (Signed)
Online Resources for Power over Parkinson's Group April 2022  . Local [place] Online Groups  o Power over [employer] :   - Power Over Parkinson's Patient Education Group will be Wednesday, April 13th at 2pm via [place].   - Upcoming Power over Parkinson's Meetings:  2nd Wednesdays of the month at 2 pm:  May 11th, June 8th - Contact Amy Marriott at [email or web address] if interested in participating in this online group o Parkinson's Care Partners Group:    3rd Mondays, Contact Misty Palladino o Atypical Parkinsonian Patient Group:   4th Wednesdays, Contact Misty Palladino o If you are interested in participating in these online groups with Misty, please contact her directly for how to join those meetings.  Her contact information is [email or web address]  She will send you a link to join the [employer].    . [place]:  [email or web address] o PD Health at Home continues:  Mindfulness Mondays, Expert Briefing Tuesdays, Wellness Wednesdays, Take Time Thursdays, Fitness Fridays -Listings for March 2022 are on the website o Upcoming Webinar:  Can We Put the Brakes on PD Progression?  Wednesday, April 6th @ 1 pm o Geneticist, molecular) at [email or web address] o  Please check out their website to sign up for emails and see their full online offerings  . [place]:  [email or web address]  o Upcoming Webinar:   New to Parkinson's?  Steps to Take Today.  Thursday, March 21st @ 12 noon o Check out additional information on their website to see their full online offerings  . [place]:  [email or web address] o Upcoming Webinar:  Stay tuned o Care Partner Monthly Meetup.  With [place].  First Tuesday of each month, 2 pm o Check out additional information to Live Well Today on their website  . Parkinson and Movement Disorders (PMD) Alliance:  www.pmdalliance.org o [employer] Online:   Online Education Events o Sign up for emails, which are sent weekly to give you updates on programming and online offerings     . Parkinson's Association of the [place]:  www.[email or web address] o Information on online support groups, education events, and online exercises including Yoga, Parkinson's exercises and more-LOTS of information on links to PD resources and online events o Virtual Support Group through Parkinson's Association of the [place]; next one is scheduled for Wednesday, April 08, 2020 at 2 pm. (These are typically scheduled for the 1st Wednesday of the month at 2 pm).  Visit website for details.  . Additional links for movement activities: o PWR! Moves Classes at [place] RESUMED!  Wednesdays 10 and 11 am.  Contact Amy Marriott, PT [email or web address] or [phone] if interested o Here is a link to the PWR!Moves classes on Zoom from [phone] - Daily Mon-Sat at 10:00. Via Zoom, FREE and open to all.  There is also a link below via Facebook if you use that platform. - [place] - [email or web address] o Parkinson's Wellness Recovery (PWR! Moves)  www.pwr4life.org - Info on the PWR! Virtual Experience:  You will have access to our expertise through self-assessment, guided plans that start with the PD-specific fundamentals, educational content, tips, Q&A with an expert, and a growing PrepaidParty.no of PD-specific pre-recorded and live exercise classes of varying types and intensity - both physical and cognitive! If that is not enough, we offer 1:1 wellness consultations (in-person or virtual) to personalize your PWR! Art therapist.  - Check out the PWR! Move of the month on the [place] Recovery website:  [email or web address].[email or web address] o [employer] Fridays:  - As part of the PD Health @ Home program, this free video series focuses each week on one aspect of fitness designed to support people living with Parkinson's.  These weekly videos highlight the [place] recent fitness guidelines for people with Parkinson's disease. -  HollywoodSale.dk o Dance for PD website is offering free, live-stream classes throughout the week, as well as links to [employer] of classes:  https://danceforparkinsons.[email or web address] o Dance for Parkinson's Class:  [place].  Free offering for people with Parkinson's and care partners; virtual class.  o For more information, contact [phone] or email Ruffin Frederick at [email or web address] o Virtual dance and Pilates for Parkinson's classes: Click on the Community Tab> Parkinson's Movement Initiative Tab.  To register for classes and for more information, visit www.[email or web address].co.za and click the "community" tab.     o [employer] Parkinson's Cycling Classes  - [facility] [employer]: 1pm on Fridays-Live classes at [facility] [employer] at [email or web address] or [phone]) [facility] [employer]: Virtual Classes Mondays and Thursdays (contact [place] at [email or web address] or [phone])  o [place] levels of classes are offered Tuesdays and Thursdays:  10:30 am,  12 noon & 1:45 pm at [facility].  - Active Stretching with Paula Compton Class starting in March, on Fridays - To observe a class or for  more information, call [phone] or email [email or web address] . Well[employer]Spring [employer]: o Chief Technology Officer Opportunities:  www.well-springsolutions.org/caregiver-education/caregiver-support-group.  You may also contact Vickki Muff at  [email or web address] or [phone].   o Well-Spring Navigator:  Just1Navigator program, a free service to help individuals and families through the journey of determining care for older adults.  The "Navigator" is a [phone], Education officer, museum, who will speak with a prospective client and/or loved ones to provide an assessment of the situation and a set of recommendations for a personalized care plan -- all free of charge, and whether Well[employer]Spring [employer] offers the needed service or not. If the need is not a service we provide, we are well-connected with reputable programs in town that we can refer you to.  www.well-springsolutions.org or to speak with the Navigator, call [phone].

## 2020-05-04 ENCOUNTER — Telehealth: Payer: Self-pay | Admitting: Neurology

## 2020-05-04 NOTE — Telephone Encounter (Signed)
Patient notified

## 2020-05-04 NOTE — Telephone Encounter (Signed)
yes

## 2020-05-04 NOTE — Telephone Encounter (Signed)
Patient called in wanting to know if he should be taking his Neupro patch along with the carbidopa-levodopa?

## 2020-05-22 ENCOUNTER — Ambulatory Visit: Payer: BC Managed Care – PPO | Admitting: Neurology

## 2020-06-04 ENCOUNTER — Ambulatory Visit: Payer: BC Managed Care – PPO | Admitting: Family Medicine

## 2020-06-08 ENCOUNTER — Ambulatory Visit: Payer: BC Managed Care – PPO | Admitting: Family Medicine

## 2020-06-08 ENCOUNTER — Other Ambulatory Visit: Payer: Self-pay

## 2020-06-08 ENCOUNTER — Encounter: Payer: Self-pay | Admitting: Family Medicine

## 2020-06-08 VITALS — BP 124/80 | HR 71 | Temp 98.6°F | Ht 75.0 in | Wt 193.4 lb

## 2020-06-08 DIAGNOSIS — Z87898 Personal history of other specified conditions: Secondary | ICD-10-CM | POA: Diagnosis not present

## 2020-06-08 DIAGNOSIS — G20 Parkinson's disease: Secondary | ICD-10-CM | POA: Diagnosis not present

## 2020-06-08 NOTE — Patient Instructions (Addendum)
No changes today-glad your fatigue improved coming off the amoxicillin-we listed this as an allergy/intolerance  Recommended follow up: keep December visit

## 2020-06-08 NOTE — Progress Notes (Signed)
Phone [phone] In person visit   Subjective:   Dan [name] is a 65 y.o. year old very pleasant male patient who presents for/with See problem oriented charting Chief Complaint  Patient presents with  . Fatigue    Pt said it has subsided,he was given an antibiotic from the dentist and one of the side effects was extreme fatigue. Pt said he did contact the Dentist to put on his allergies. Pt said after he finished the medication Amoxicillin everyday he was feeling  better.    This visit occurred during the SARS-CoV-2 public health emergency.  Safety protocols were in place, including screening questions prior to the visit, additional usage of staff PPE, and extensive cleaning of exam room while observing appropriate contact time as indicated for disinfecting solutions.   Past Medical History-  Patient Active Problem List   Diagnosis Date Noted  . Hypotestosteronemia 11/05/2015    Priority: High  . Elevated PSA 10/30/2014    Priority: High  . Parkinsonism ([place]) 10/30/2014    Priority: High  . Low serum vitamin D 04/16/2015    Priority: Medium  . Hyperlipidemia 05/24/2010    Priority: Medium  . Anxiety state 08/11/2006    Priority: Medium  . Bleeding hemorrhoid 05/24/2010    Priority: Low  . Irritable bowel syndrome 08/29/2007    Priority: Low  . History of colon polyps 01/08/2018    Medications- reviewed and updated Current Outpatient Medications  Medication Sig Dispense Refill  . Alpha-Lipoic Acid 200 MG TABS Take by mouth daily.     . Ascorbic Acid (VITAMIN C) 1000 MG tablet Take 1,000 mg by mouth daily. With rose hips    . B Complex Vitamins (VITAMIN B COMPLEX PO) Take by mouth daily.     . carbidopa-levodopa (SINEMET IR) 25-100 MG tablet 2 TABLETS AT 6AM/2 AT 9AM/2 AT 12PM/2 AT 3PM/1 AT 6PM (Patient taking differently: 2 tabs a 6am,2 tabs at 9am,2 tab at 12, 2at 3pm, 2tabs at 6pm) 810 tablet 1  . cholecalciferol (VITAMIN D) 1000 units tablet Take 1,000 Units  by mouth daily. Vit d 3- Take 125 mcg daily    . FOLIC ACID PO Take 295 mg by mouth.     Marland Kitchen glucosamine-chondroitin 500-400 MG tablet Take 1 tablet by mouth daily.     Marland Kitchen L-Lysine 500 MG TABS Take 1,000 mg by mouth daily.     . Multiple Vitamin (MULTIVITAMIN) tablet Take 1 tablet by mouth daily.    . Omega-3 Fatty Acids (FISH OIL) 1000 MG CAPS Take 1,000 mg by mouth daily.    . saw palmetto 160 MG capsule Take 450 mg by mouth daily.     . vitamin B-12 (CYANOCOBALAMIN) 1000 MCG tablet Take 1,000 mcg by mouth daily.    . rotigotine (NEUPRO) 4 MG/24HR Place 1 patch onto the skin daily. (Patient not taking: Reported on 06/08/2020) 30 patch 5   No current facility-administered medications for this visit.     Objective:  BP 124/80 (BP Location: Left Arm, Patient Position: Sitting, Cuff Size: Normal)   Pulse 71   Temp 98.6 F (37 C) (Temporal)   Ht 6\' 3"  (1.905 m)   Wt 193 lb 6.1 oz (87.7 kg)   SpO2 96%   BMI 24.17 kg/m  Gen: NAD, resting comfortably Skin: warm, dry Neuro: Baseline tremor noted.    Assessment and Plan  # Parkinson's Disease S: Patient was prescribed Neupro due to Parkinson's Disease. While he was on Amoxicillin he  developed some severe fatigue and weakness- we have listed this as an allergy for him/intolerance A/P: Parkinsons thankfulyl stable on Sinemet IR alone-continue to follow up with Dr. Carles Collet  - Thankfully severe weakness/fatigue resolved off of amoxicillin- on allergy list as above  #Eye itchiness:  Occasional eye itchiness with some watery and irritation at time (he uses warmer cool compresses and very helpful-he also has allergies)-requesting a referral to an eye specialist. Denies dry eyes or vision blurriness..  We discussed several options locally-he is currently followed at [facility] and gets dilated eye exams there-discussed I think that is reasonable to continue with them unless worsening issues-also discussed a couple local ophthalmology offices if he  prefers  Recommended follow up: Keep December physical Future Appointments  Date Time Provider [place]  10/01/2020  8:15 AM Tat, Eustace Quail, DO [facility] None  12/10/2020  8:20 AM Yong Channel Brayton Mars, MD [facility]   Lab/Order associations:   ICD-10-CM   1. Parkinson's disease ([place])  G20   2. History of fatigue  Z87.898     I,Alexis Bryant,acting as a scribe for [name], MD.,have documented all relevant documentation on the behalf of [name], MD,as directed by  [name], MD while in the presence of [name], MD.   I, [name], MD, have reviewed all documentation for this visit. The documentation on 06/08/20 for the exam, diagnosis, procedures, and orders are all accurate and complete.   Return precautions advised.  [name], MD

## 2020-06-15 ENCOUNTER — Other Ambulatory Visit: Payer: Self-pay | Admitting: Neurology

## 2020-06-15 NOTE — Telephone Encounter (Signed)
Patient has follow up 09/04/2020 with Dr.Tat

## 2020-08-24 NOTE — Progress Notes (Signed)
Assessment/Plan:   1.  Parkinsons Disease  -Continue carbidopa/levodopa 25/100, 2 tablets 5  times per day (6am/9am/noon/3pm/5pm).    -discussed entacapone.  Take it with first 3 dosages of levodopa.  R/B/SE were discussed.  The opportunity to ask questions was given and they were answered to the best of my ability.  The patient expressed understanding and willingness to follow the outlined treatment protocols.   2.  Sciatica, intermittent  -Doing well from the standpoint right now.  3.  Insomnia  -try melatonin, 3 mg q hs.   Subjective:   Dan [name] was seen today in follow up for Parkinsons disease.  My previous records were reviewed prior to todays visit as well as outside records available to me. Pt denies falls.  No lightheadedness or near syncope.  Rotigotine was started last visit.  Patient reports that he didn't take that medication.  Noting wearing off at about 2.5 hours.  Still at gym 2 days per week and works 3 days per week.  Little more tremor, esp if "agitated or nervous."  Current prescribed movement disorder medications: Carbidopa/levodopa 25/100,  2 tablets 5  times per day (6am/9am/noon/3pm/5pm) Rotigotine, 4 mg daily (started last visit) -patient not taking  PREVIOUS MEDICATIONS:  pramipexole (patient thought worsen depression and cause headache/nausea); rotigotine (given it but didn't take it - worried about it due to cost/upcoming MCR)  ALLERGIES:   Allergies  Allergen Reactions   Amoxicillin Other (See Comments)    Severe fatigue    CURRENT MEDICATIONS:  Outpatient Encounter Medications as of 08/25/2020  Medication Sig   Alpha-Lipoic Acid 200 MG TABS Take by mouth daily.    Ascorbic Acid (VITAMIN C) 1000 MG tablet Take 1,000 mg by mouth daily. With rose hips   B Complex Vitamins (VITAMIN B COMPLEX PO) Take by mouth daily.    carbidopa-levodopa (SINEMET IR) 25-100 MG tablet 2 TABLETS AT 6AM/2 AT 9AM/2 AT 12PM/2 AT 3PM/1 AT 6PM (Patient taking  differently: 2 tablets at 6am/9am/12pm/3pm/6pm)   cholecalciferol (VITAMIN D) 1000 units tablet Take 1,000 Units by mouth daily. Vit d 3- Take 0000000 mcg daily   FOLIC ACID PO Take [id number] mg by mouth.    glucosamine-chondroitin 500-400 MG tablet Take 1 tablet by mouth daily.    L-Lysine 500 MG TABS Take 1,000 mg by mouth daily.    Multiple Vitamin (MULTIVITAMIN) tablet Take 1 tablet by mouth daily.   Omega-3 Fatty Acids (FISH OIL) 1000 MG CAPS Take 1,000 mg by mouth daily.   rotigotine (NEUPRO) 4 MG/24HR Place 1 patch onto the skin daily.   saw palmetto 160 MG capsule Take 450 mg by mouth daily.    Turmeric (QC TUMERIC COMPLEX PO) Take by mouth.   vitamin B-12 (CYANOCOBALAMIN) 1000 MCG tablet Take 1,000 mcg by mouth daily.   No facility-administered encounter medications on file as of 08/25/2020.    Objective:   PHYSICAL EXAMINATION:    VITALS:   Vitals:   08/25/20 0807  BP: 132/90  Pulse: 79  SpO2: 98%  Weight: 195 lb (88.5 kg)  Height: '6\' 3"'$  (1.905 m)     GEN:  The patient appears stated age and is in NAD. HEENT:  Normocephalic, atraumatic.  The mucous membranes are moist.   Neurological examination:  Orientation: The patient is alert and oriented x3. Cranial nerves: There is good facial symmetry without facial hypomimia. The speech is fluent and clear. Soft palate rises symmetrically and there is no tongue deviation. Hearing is intact  to conversational tone. Sensation: Sensation is intact to light touch throughout Motor: Strength is at least antigravity x4.  Movement examination: Tone: There is nl tone in the UE/LE Abnormal movements: he has fairly consistent LUE rest tremor, mild to mod amplitude.  He has rare RUE tremor Coordination:  There is min decremation with RAMs only with toe taps on the L.  All other RAMs are good and WNL Gait and Station: The patient has no difficulty arising out of a deep-seated chair without the use of the hands. The patient's stride length is  good with mild decreased arm swing on the L.  Able to squat without trouble.    I have reviewed and interpreted the following labs independently    Chemistry      Component Value Date/Time   NA 142 12/05/2019 0901   K 4.3 12/05/2019 0901   CL 106 12/05/2019 0901   CO2 25 12/05/2019 0901   BUN 18 12/05/2019 0901   CREATININE 0.92 12/05/2019 0901      Component Value Date/Time   CALCIUM 9.3 12/05/2019 0901   ALKPHOS 48 11/15/2018 0902   AST 16 12/05/2019 0901   ALT 8 (L) 12/05/2019 0901   BILITOT 0.8 12/05/2019 0901       Lab Results  Component Value Date   WBC 5.7 12/05/2019   HGB 14.6 12/05/2019   HCT 43.4 12/05/2019   MCV 93.5 12/05/2019   PLT 180 12/05/2019    Lab Results  Component Value Date   TSH 0.99 05/09/2017     Total time spent on today's visit was 30  minutes, including both face-to-face time and nonface-to-face time.  Time included that spent on review of records (prior notes available to me/labs/imaging if pertinent), discussing treatment and goals, answering patient's questions and coordinating care.  Cc:  Marin Olp, MD

## 2020-08-25 ENCOUNTER — Encounter: Payer: Self-pay | Admitting: Neurology

## 2020-08-25 ENCOUNTER — Other Ambulatory Visit: Payer: Self-pay

## 2020-08-25 ENCOUNTER — Ambulatory Visit: Payer: BC Managed Care – PPO | Admitting: Neurology

## 2020-08-25 VITALS — BP 132/90 | HR 79 | Ht 75.0 in | Wt 195.0 lb

## 2020-08-25 DIAGNOSIS — G20 Parkinson's disease: Secondary | ICD-10-CM | POA: Diagnosis not present

## 2020-08-25 MED ORDER — ENTACAPONE 200 MG PO TABS: 200.0000 mg | ORAL_TABLET | Freq: Three times a day (TID) | ORAL | 1 refills | Status: DC

## 2020-08-25 MED ORDER — CARBIDOPA-LEVODOPA 25-100 MG PO TABS: ORAL_TABLET | ORAL | 1 refills | Status: DC

## 2020-08-25 NOTE — Patient Instructions (Signed)
Trial entacapone 200 mg - take with first 3 dosages of carbidopa/levodopa 25/100  The physicians and staff at [facility] are committed to providing excellent care. You may receive a survey requesting feedback about your experience at our office. We strive to receive "very good" responses to the survey questions. If you feel that your experience would prevent you from giving the office a "very good " response, please contact our office to try to remedy the situation. We may be reached at [phone]. Thank you for taking the time out of your busy day to complete the survey.

## 2020-09-15 ENCOUNTER — Other Ambulatory Visit: Payer: Self-pay | Admitting: Neurology

## 2020-09-15 DIAGNOSIS — G20 Parkinson's disease: Secondary | ICD-10-CM

## 2020-10-01 ENCOUNTER — Ambulatory Visit: Payer: BC Managed Care – PPO | Admitting: Neurology

## 2020-11-03 ENCOUNTER — Telehealth: Payer: Self-pay | Admitting: Neurology

## 2020-11-03 NOTE — Telephone Encounter (Signed)
Patient states that he is having with not able to sleep and medication carbidopa levodopa.  He is not sure if the dosage of the medication should be changed  He would like to speak to someone about both of these problems

## 2020-11-04 NOTE — Telephone Encounter (Signed)
Called patient back and he said he is still going to PT and working and taking care of an elderly parent. He feels that the entacapone isn't helping him he used to go 3 hrs before feeling the effects of the Levodopa wearing off now he feels it after around 2.5 hours. He said that he is not able to stay asleep and he he is aware that some of this maybe stress related but he said he feels Dr. Carles Collet would want to know about his lack of sleep it has been going on for about 8-10 weeks

## 2020-11-05 NOTE — Telephone Encounter (Signed)
Called patient and LVM.

## 2020-11-05 NOTE — Telephone Encounter (Signed)
Patient called back and we discussed him trying the melatonin and night he has not tried it but is going to get some to try and he has been added to CX list

## 2020-12-08 NOTE — Progress Notes (Signed)
Patient no show for visit.

## 2020-12-10 ENCOUNTER — Telehealth: Payer: Self-pay | Admitting: Neurology

## 2020-12-10 ENCOUNTER — Ambulatory Visit (INDEPENDENT_AMBULATORY_CARE_PROVIDER_SITE_OTHER): Payer: Medicare HMO | Admitting: Family Medicine

## 2020-12-10 DIAGNOSIS — Z0000 Encounter for general adult medical examination without abnormal findings: Secondary | ICD-10-CM

## 2020-12-10 NOTE — Telephone Encounter (Signed)
Called patient back he is still doing all his normal activities but felt like the entacapone was not helping he was taking it at different times and finally discontinued the medic cation. I let patient know he is on the CX list and will be contacted if anything comes up any earlier .

## 2020-12-10 NOTE — Patient Instructions (Signed)
Health Maintenance Due  Topic Date Due   Pneumonia Vaccine 64+ Years old (1 - PCV) Never done    Recommended follow up: No follow-ups on file.

## 2020-12-10 NOTE — Telephone Encounter (Signed)
Pt called in stating he thinks his Parkinson's is progressing. He is finding his medicine isn't lasting as long as it used to. He doesn't feel the entacapone was helping to make it last longer.

## 2020-12-16 ENCOUNTER — Telehealth: Payer: Self-pay | Admitting: Neurology

## 2020-12-16 NOTE — Telephone Encounter (Signed)
Patient said he is very concerned about his health. He wants a call from Dr.Tat, and not a nurse. He does not think he needs to wait for feb to be seen.

## 2020-12-17 NOTE — Telephone Encounter (Signed)
Called patient he has agreed to see Sharene Butters PA . Green Environmental manager calling to schedule

## 2020-12-17 NOTE — Telephone Encounter (Signed)
Patient contacted and scheduled with Sharene Butters, PA on 12/22/20 at 9:30AM.  Okay per [facility]

## 2020-12-21 NOTE — Progress Notes (Signed)
Assessment/Plan:   1.  Parkinsons Disease  -Continue carbidopa/levodopa 25/100, 2 tablets 5  times per day (6am/9am/noon/3pm/5pm).    -Start Requip as follows ( patient prefers not to cut tabs due to difficulties with fine motor skills):   Take 1 tab  0.25 mg 3 times a day for 1 week ( 7 am, 11 am, 4 pm)  then,   Take 2 tabs, 0.50  mg 3 times a day for 1 week, then,  Take 3 tabs, 0.75 mg 3 times a day for 1 week and then   Take 4 tabs, 1 mg 3 times a day thereafter.  #210 no refills If tolerated will fill a prescription for Requip 1 mg tid in 1 month   2.  Sciatica, intermittent  -Doing well from the standpoint right now.  3.  Insomnia, improved  -continue melatonin, 3 mg q hs.     Subjective:   Dan [name] was seen today in follow up for Parkinsons disease.  My previous records were reviewed prior to todays visit as well as outside records available to me. Pt denies falls.  No lightheadedness or near syncope. Not on  Rotigotine , never started due to cost. He notes that carbidopa/levodopa 25/100 is wearing off in around 2 hours so he takes it at 5:30 am to allow him to last till he takes a shower and shaves, and allows him to eat without movement issues. He does not miss any doses. He tried  Noting wearing off at about 2.5 hours. He tried entacapone and reports that "did nothing" so he stopped 3 weeks ago. He is here to discuss any other options for therapy.  Still at gym 2 days per week and works 3 days per week.  Little more tremor, especially if "agitated or nervous." There is stress at work.  He denies dropping objects.  He denies any hallucinations, paranoia, or falls.  He denies diplopia, his eyes were checked 2 weeks ago.  He denies any headaches.  He denies any urinary or bowel incontinence.  His appetite is good, occasionally he experiences some trouble swallowing.  He denies any excessive drooling.  He denies any muscle cramps in his legs, only when Sinemet wears off, his  left hand becomes more contracted, he describes it as spasm, "have to force myself to straighten it out ".  He still able to perform well his activities of daily living while on the medicine, with the exception of buttoning "small buttons or objects ".  He still able to stitch without difficulty.  He denies any mood changes.  He sleep is better with melatonin.  He is due for a skin checkup, he does not notice any new moles.  He likes to write, I do it a lot ".  No changes in the tone of his voice.  Current prescribed movement disorder medications: Carbidopa/levodopa 25/100,  2 tablets 5  times per day (6am/9am/noon/3pm/5pm)  PREVIOUS MEDICATIONS:  pramipexole (patient thought worsen depression and cause headache/nausea); rotigotine (given it but didn't take it - worried about it due to cost/upcoming MCR) -Rotigotine, 4 mg daily (started last visit) -patient not taking  entacapone isn't helping him he used to go 3 hrs before feeling the effects of the Levodopa wearing off now he feels it after around 2 hours.  ALLERGIES:   Allergies  Allergen Reactions   Amoxicillin Other (See Comments)    Severe fatigue    CURRENT MEDICATIONS:  Outpatient Encounter Medications as of 12/22/2020  Medication Sig   Alpha-Lipoic Acid 200 MG TABS Take by mouth daily.    Ascorbic Acid (VITAMIN C) 1000 MG tablet Take 1,000 mg by mouth daily. With rose hips   B Complex Vitamins (VITAMIN B COMPLEX PO) Take by mouth daily.    carbidopa-levodopa (SINEMET IR) 25-100 MG tablet 2 TABLETS AT 6AM/2 AT 9AM/2 AT 12PM/2 AT 3PM/1 AT 6PM   cholecalciferol (VITAMIN D) 1000 units tablet Take 1,000 Units by mouth daily. Vit d 3- Take 025 mcg daily   FOLIC ACID PO Take 852 mg by mouth.    glucosamine-chondroitin 500-400 MG tablet Take 1 tablet by mouth daily.    L-Lysine 500 MG TABS Take 1,000 mg by mouth daily.    Multiple Vitamin (MULTIVITAMIN) tablet Take 1 tablet by mouth daily.   Omega-3 Fatty Acids (FISH OIL) 1000 MG CAPS  Take 1,000 mg by mouth daily.   rOPINIRole (REQUIP) 0.25 MG tablet Take 1 tab  0.25 mg 3 times a day for 1 week ( 7 am, 11 am, 4 pm)  then,  Take 2 tabs, 0.50  mg 3 times a day for 1 week, then,  Take 3 tabs, 0.75 mg 3 times a day for 1 week and then Take 4 tabs, 1 mg 3 times a day thereafter   saw palmetto 160 MG capsule Take 450 mg by mouth daily.    Turmeric (QC TUMERIC COMPLEX PO) Take by mouth.   vitamin B-12 (CYANOCOBALAMIN) 1000 MCG tablet Take 1,000 mcg by mouth daily.   [DISCONTINUED] rotigotine (NEUPRO) 4 MG/24HR Place 1 patch onto the skin daily.   [DISCONTINUED] entacapone (COMTAN) 200 MG tablet Take 1 tablet (200 mg total) by mouth 3 (three) times daily.   No facility-administered encounter medications on file as of 12/22/2020.    Objective:   PHYSICAL EXAMINATION:    VITALS:   Vitals:   12/22/20 0913  BP: (!) 153/79  Pulse: 77  Resp: 18  SpO2: 98%  Weight: 197 lb (89.4 kg)  Height: 6\' 3"  (1.905 m)      GEN:  The patient appears stated age and is in NAD. HEENT:  Normocephalic, atraumatic.  The mucous membranes are moist.   Neurological examination:  Orientation: The patient is alert and oriented x3. Cranial nerves: There is good facial symmetry without facial hypomimia. The speech is fluent and clear. Soft palate rises symmetrically and there is no tongue deviation. Hearing is intact to conversational tone. Sensation: Sensation is intact to light touch throughout Motor: Strength is at least antigravity x4.  Movement examination: Tone: There is nl tone in the UE/LE Abnormal movements: he has fairly consistent LUE rest tremor, mild to mod amplitude.  He has rare RUE tremor Coordination:  There is min decremation with RAMs only with toe taps on the L.  All other RAMs are good and WNL Gait and Station: The patient has no difficulty arising out of a deep-seated chair without the use of the hands. The patient's stride length is good with mild decreased arm swing on  the L.  Able to squat without trouble.    I have reviewed and interpreted the following labs independently    CBC Latest Ref Rng & Units 12/05/2019 11/15/2018 11/10/2017  WBC 3.8 - 10.8 Thousand/uL 5.7 5.3 5.7  Hemoglobin 13.2 - 17.1 g/dL 14.6 14.6 15.0  Hematocrit 38.5 - 50.0 % 43.4 43.8 44.8  Platelets 140 - 400 Thousand/uL 180 182.0 182.0     CMP Latest Ref Rng & Units 12/05/2019  11/15/2018 11/10/2017  Glucose 65 - 99 mg/dL 90 96 89  BUN 7 - 25 mg/dL 18 16 17   Creatinine 0.70 - 1.25 mg/dL 0.92 0.89 0.81  Sodium 135 - 146 mmol/L 142 139 139  Potassium 3.5 - 5.3 mmol/L 4.3 4.1 4.2  Chloride 98 - 110 mmol/L 106 102 102  CO2 20 - 32 mmol/L 25 30 32  Calcium 8.6 - 10.3 mg/dL 9.3 9.4 9.4  Total Protein 6.1 - 8.1 g/dL 6.6 6.6 6.8  Total Bilirubin 0.2 - 1.2 mg/dL 0.8 0.8 0.8  Alkaline Phos 39 - 117 U/L - 48 45  AST 10 - 35 U/L 16 13 17   ALT 9 - 46 U/L 8(L) 5 4      Lab Results  Component Value Date   TSH 0.99 05/09/2017     Total time spent on today's visit was 30  minutes, including both face-to-face time and nonface-to-face time.  Time included that spent on review of records (prior notes available to me/labs/imaging if pertinent), discussing treatment and goals, answering patient's questions and coordinating care.  Cc:  Marin Olp, MD

## 2020-12-22 ENCOUNTER — Other Ambulatory Visit: Payer: Self-pay

## 2020-12-22 ENCOUNTER — Encounter: Payer: Self-pay | Admitting: Physician Assistant

## 2020-12-22 ENCOUNTER — Ambulatory Visit: Payer: Medicare HMO | Admitting: Physician Assistant

## 2020-12-22 VITALS — BP 153/79 | HR 77 | Resp 18 | Ht 75.0 in | Wt 197.0 lb

## 2020-12-22 DIAGNOSIS — G20 Parkinson's disease: Secondary | ICD-10-CM

## 2020-12-22 MED ORDER — ROPINIROLE HCL 0.25 MG PO TABS: ORAL_TABLET | ORAL | 1 refills | Status: DC

## 2020-12-22 NOTE — Patient Instructions (Signed)
Requested Prescriptions   Signed Prescriptions Disp Refills   rOPINIRole (REQUIP) 0.25 MG tablet 210 tablet 1    Sig: Take 1 tab  0.25 mg 3 times a day for 1 week ( 7 am, 11 am, 4 pm)  then,  Take 2 tabs, 0.50  mg 3 times a day for 1 week, then,  Take 3 tabs, 0.75 mg 3 times a day for 1 week and then Take 4 tabs, 1 mg 3 times a day thereafter     Follow up with Dr. Carles Collet as scheduled

## 2021-01-07 DIAGNOSIS — Z8042 Family history of malignant neoplasm of prostate: Secondary | ICD-10-CM | POA: Diagnosis not present

## 2021-01-07 DIAGNOSIS — R972 Elevated prostate specific antigen [PSA]: Secondary | ICD-10-CM | POA: Diagnosis not present

## 2021-01-07 DIAGNOSIS — G20 Parkinson's disease: Secondary | ICD-10-CM | POA: Diagnosis not present

## 2021-01-26 DIAGNOSIS — L738 Other specified follicular disorders: Secondary | ICD-10-CM | POA: Diagnosis not present

## 2021-01-26 DIAGNOSIS — L718 Other rosacea: Secondary | ICD-10-CM | POA: Diagnosis not present

## 2021-01-26 DIAGNOSIS — D2261 Melanocytic nevi of right upper limb, including shoulder: Secondary | ICD-10-CM | POA: Diagnosis not present

## 2021-01-26 DIAGNOSIS — D225 Melanocytic nevi of trunk: Secondary | ICD-10-CM | POA: Diagnosis not present

## 2021-02-26 NOTE — Progress Notes (Signed)
Assessment/Plan:   1.  Parkinsons Disease  -He has increased from carbidopa/levodopa 25/100, 2 tablets 5  times per day (6am/9am/noon/3pm/5pm) to 14 per day.  Told him I don't want to use this much levodopa right now.  -restart requip and work to 1 mg three times per day.  R/B/SE were discussed.  The opportunity to ask questions was given and they were answered to the best of my ability.  The patient expressed understanding and willingness to follow the outlined treatment protocols.    2.  Sciatica, intermittent  -Doing well from the standpoint right now.  3.  Insomnia  -try melatonin, 3 mg q hs.   Subjective:   Dan [name] was seen today in follow up for Parkinsons disease.  My previous records were reviewed prior to todays visit as well as outside records available to me. Pt denies falls.  No lightheadedness or near syncope.  He was started on entacapone last visit.  He did not find it helpful and ended up discontinuing it.  He called and complained of wearing off.  He saw my PA in December and ropinirole was started, 1 mg 3 times per day because of wearing off.  He states today that he is not on the medication.  He states that he broke out in rash/acne and went to dermatology (Dr. Ronnald Ramp) and they told him it wasn't from the requip but he didn't want to restart it until we chatted.  Dr. Ronnald Ramp dx him with rosacea and gave him metrogel.  He had rare dizziness with it.  Current prescribed movement disorder medications: Carbidopa/levodopa 25/100,  2 tablets 5  times per day (6am/9am/noon/3pm/5pm) - pt states that he is taking 2 po 7 times per day and taking 2 po q 2 hours. Ropinirole, 1 mg 3 times per day (started December)   PREVIOUS MEDICATIONS:  pramipexole (patient thought worsen depression and cause headache/nausea); rotigotine (given it but didn't take it - worried about it due to cost/upcoming MCR); entacapone (patient stopped after thought not helping)  ALLERGIES:    Allergies  Allergen Reactions   Amoxicillin Other (See Comments)    Severe fatigue    CURRENT MEDICATIONS:  Outpatient Encounter Medications as of 03/01/2021  Medication Sig   Ascorbic Acid (VITAMIN C) 1000 MG tablet Take 1,000 mg by mouth daily. With rose hips   B Complex Vitamins (VITAMIN B COMPLEX PO) Take by mouth daily.    cholecalciferol (VITAMIN D) 1000 units tablet Take 1,000 Units by mouth daily. Vit d 3- Take 962 mcg daily   FOLIC ACID PO Take 952 mg by mouth.    glucosamine-chondroitin 500-400 MG tablet Take 1 tablet by mouth daily.    L-Lysine 500 MG TABS Take 1,000 mg by mouth daily.    Multiple Vitamin (MULTIVITAMIN) tablet Take 1 tablet by mouth daily.   Omega-3 Fatty Acids (FISH OIL) 1000 MG CAPS Take 1,000 mg by mouth daily.   saw palmetto 160 MG capsule Take 450 mg by mouth daily.    Turmeric (QC TUMERIC COMPLEX PO) Take by mouth.   vitamin B-12 (CYANOCOBALAMIN) 1000 MCG tablet Take 1,000 mcg by mouth daily.   Alpha-Lipoic Acid 200 MG TABS Take by mouth daily.    carbidopa-levodopa (SINEMET IR) 25-100 MG tablet 2 TABLETS AT 6AM/2 AT 9AM/2 AT 12PM/2 AT 3PM/1 AT 6PM   rOPINIRole (REQUIP) 0.25 MG tablet Take 1 tab  0.25 mg 3 times a day for 1 week ( 7 am, 11 am, 4  pm)  then,  Take 2 tabs, 0.50  mg 3 times a day for 1 week, then,  Take 3 tabs, 0.75 mg 3 times a day for 1 week and then Take 4 tabs, 1 mg 3 times a day thereafter (Patient not taking: Reported on 03/01/2021)   No facility-administered encounter medications on file as of 03/01/2021.    Objective:   PHYSICAL EXAMINATION:    VITALS:   Vitals:   03/01/21 0806  BP: 129/84  Pulse: 74  SpO2: 96%  Weight: 203 lb 3.2 oz (92.2 kg)  Height: 6\' 3"  (1.905 m)      GEN:  The patient appears stated age and is in NAD. HEENT:  Normocephalic, atraumatic.  The mucous membranes are moist.   Neurological examination:  Orientation: The patient is alert and oriented x3. Cranial nerves: There is good facial symmetry  without facial hypomimia. The speech is fluent and clear. Soft palate rises symmetrically and there is no tongue deviation. Hearing is intact to conversational tone. Sensation: Sensation is intact to light touch throughout Motor: Strength is at least antigravity x4.  Movement examination: Tone: There is nl tone in the UE/LE Abnormal movements: he has rare LUE rest tremor; rare L leg dyskinesia Coordination:  There is min decremation with RAMs only with toe taps on the L.  All other RAMs are good and WNL Gait and Station: The patient has no difficulty arising out of a deep-seated chair without the use of the hands. The patient's stride length is good with good arm swing today  I have reviewed and interpreted the following labs independently    Chemistry      Component Value Date/Time   NA 142 12/05/2019 0901   K 4.3 12/05/2019 0901   CL 106 12/05/2019 0901   CO2 25 12/05/2019 0901   BUN 18 12/05/2019 0901   CREATININE 0.92 12/05/2019 0901      Component Value Date/Time   CALCIUM 9.3 12/05/2019 0901   ALKPHOS 48 11/15/2018 0902   AST 16 12/05/2019 0901   ALT 8 (L) 12/05/2019 0901   BILITOT 0.8 12/05/2019 0901       Lab Results  Component Value Date   WBC 5.7 12/05/2019   HGB 14.6 12/05/2019   HCT 43.4 12/05/2019   MCV 93.5 12/05/2019   PLT 180 12/05/2019    Lab Results  Component Value Date   TSH 0.99 05/09/2017     Total time spent on today's visit was 31  minutes, including both face-to-face time and nonface-to-face time.  Time included that spent on review of records (prior notes available to me/labs/imaging if pertinent), discussing treatment and goals, answering patient's questions and coordinating care.  Cc:  Marin Olp, MD

## 2021-03-01 ENCOUNTER — Ambulatory Visit: Payer: Medicare HMO | Admitting: Neurology

## 2021-03-01 ENCOUNTER — Encounter: Payer: Self-pay | Admitting: Neurology

## 2021-03-01 ENCOUNTER — Other Ambulatory Visit: Payer: Self-pay

## 2021-03-01 VITALS — BP 129/84 | HR 74 | Ht 75.0 in | Wt 203.2 lb

## 2021-03-01 DIAGNOSIS — G20 Parkinson's disease: Secondary | ICD-10-CM | POA: Diagnosis not present

## 2021-03-01 MED ORDER — CARBIDOPA-LEVODOPA 25-100 MG PO TABS: ORAL_TABLET | ORAL | 1 refills | Status: DC

## 2021-03-01 NOTE — Patient Instructions (Addendum)
Take 1 tab  0.25 mg 3 times a day for 1 week ( 6am/noon/5pm)  then,   Take 2 tabs, 0.50  mg 3 times a day for 1 week , then  Take 3 tabs, 0.75 mg 3 times a day for 1 week and then  Take 1 mg three times per day thereafter   Take Carbidopa/levodopa 25/100,  2 tablets 5  times per day (6am/9am/noon/3pm/5pm).  I don't want you going up to 14 per day as I think that you will have long term side effects.

## 2021-03-22 ENCOUNTER — Telehealth: Payer: Self-pay | Admitting: Neurology

## 2021-03-22 NOTE — Telephone Encounter (Signed)
Patient dropped off a letter for front staff to give to me.  Letter stated that he was not able to tolerate ropinirole and noticed "side effects that are making regular daily activities quite difficult."  Patient did not state what side effects were, but stated that he noted issues when he went to ropinirole, 0.5 mg 3 times per day.  He is currently back to 0.25 mg 3 times per day.  [place], just have him stop the medication as this is far below the starting dose. ?

## 2021-03-22 NOTE — Telephone Encounter (Signed)
Called Patient and he said he is really struggling lately and really feeling Parkinson's disease. ?Side effects were very hot dizzy and feeling not like himself. ?

## 2021-03-25 NOTE — Telephone Encounter (Signed)
Spoke with patient ans gave Dr. Arturo Morton recommendations that he can D/C the Ropinirole if it causing side effects for the patient . ?

## 2021-04-06 ENCOUNTER — Telehealth: Payer: Self-pay

## 2021-04-06 NOTE — Telephone Encounter (Signed)
Patient called and left voicemail. Patient said he was struggling because he feels his Carbidopa levodopa is wearing off and he is feeling very bad between the doses  ?

## 2021-04-07 NOTE — Telephone Encounter (Signed)
Patient called back

## 2021-04-07 NOTE — Telephone Encounter (Signed)
Called patient and we were disconnected . Tried to call patient back with no answer left voicemail message  ?

## 2021-04-07 NOTE — Telephone Encounter (Signed)
Line busy at 9:28am  ?

## 2021-04-08 NOTE — Telephone Encounter (Signed)
Patient called and I gave him the options that dr. Carles Collet has provided. Patient is thinking about Disability possibly in the future. He  is going to do some research and see if he can afford any of the drugs offered at this time  ?

## 2021-04-08 NOTE — Telephone Encounter (Signed)
Called pateint and left voicemail message  

## 2021-04-19 ENCOUNTER — Telehealth: Payer: Self-pay | Admitting: Neurology

## 2021-04-19 NOTE — Telephone Encounter (Signed)
Patient would like to know some information about the parkinson's therapy at [facility]. A friend told him about it and he would like to try this if possible. Also would like to know if his ins covers it ?

## 2021-04-20 ENCOUNTER — Telehealth: Payer: Self-pay | Admitting: Neurology

## 2021-04-20 NOTE — Telephone Encounter (Signed)
Patient called and stated he checked with his insurance for neuro rehab for parkinsons at [place].  Hes stated that it is good but the Dr has to authorize it for them to process. ?

## 2021-04-20 NOTE — Telephone Encounter (Signed)
Pateint is reaching out to Ins to determine if it is something he can afford and will call me back to let me know  ?

## 2021-04-21 ENCOUNTER — Other Ambulatory Visit: Payer: Self-pay

## 2021-04-21 DIAGNOSIS — G20A1 Parkinson's disease without dyskinesia, without mention of fluctuations: Secondary | ICD-10-CM

## 2021-04-21 DIAGNOSIS — G20 Parkinson's disease: Secondary | ICD-10-CM

## 2021-04-21 NOTE — Telephone Encounter (Signed)
Called [facility] and verified location and sent referral to [facility] ?

## 2021-04-22 ENCOUNTER — Encounter: Payer: Self-pay | Admitting: Physical Therapy

## 2021-04-22 ENCOUNTER — Ambulatory Visit: Payer: Medicare HMO | Attending: Neurology | Admitting: Physical Therapy

## 2021-04-22 DIAGNOSIS — R2689 Other abnormalities of gait and mobility: Secondary | ICD-10-CM | POA: Diagnosis not present

## 2021-04-22 DIAGNOSIS — R2681 Unsteadiness on feet: Secondary | ICD-10-CM | POA: Diagnosis not present

## 2021-04-22 DIAGNOSIS — G20 Parkinson's disease: Secondary | ICD-10-CM | POA: Insufficient documentation

## 2021-04-22 DIAGNOSIS — R293 Abnormal posture: Secondary | ICD-10-CM | POA: Insufficient documentation

## 2021-04-22 DIAGNOSIS — R29818 Other symptoms and signs involving the nervous system: Secondary | ICD-10-CM | POA: Insufficient documentation

## 2021-04-22 NOTE — Therapy (Signed)
[place] [facility] ?[address], [address] ?[place], [place], [address] ?Phone: [phone]   Fax:  [phone] ? ?Physical Therapy Evaluation ? ?Patient Details  ?Name: Dan [name] ?MRN: [id number] ?Date of Birth: February 23, 1955 ?Referring Provider (PT): Alonza Bogus, DO ? ? ?Encounter Date: 04/22/2021 ? ? PT End of Session - 04/22/21 0847   ? ? Visit Number 1   ? Number of Visits 9   ? Date for PT Re-Evaluation 05/21/21   ? Authorization Type [employer] Medicare-request completed at eval   ? PT Start Time [phone]   ? PT Stop Time 0934   ? PT Time Calculation (min) 47 min   ? Activity Tolerance Patient tolerated treatment well   ? Behavior During Therapy [facility] for tasks assessed/performed   ? ?  ?  ? ?  ? ? ?Past Medical History:  ?Diagnosis Date  ? Allergy   ? seasonal  ? Anxiety   ? Bleeding hemorrhoid 05/24/2010  ? Depression 08/11/2006  ? Anxiety as well around down market- tough time with job loss in [employer]    ? Hyperlipidemia LDL goal < 130 05/24/2010  ? Irritable bowel syndrome 08/29/2007  ? Improved with diet change    ? Parkinson disease ([place])   ? since 2016  ? ? ?Past Surgical History:  ?Procedure Laterality Date  ? APPENDECTOMY    ? COLONOSCOPY  05-01-00  ? HERNIA REPAIR  2014  ? Bil inguinal hernia repair  ? ROOT CANAL    ? TONSILLECTOMY  1958  ? ? ?There were no vitals filed for this visit. ? ? ? Subjective Assessment - 04/22/21 0850   ? ? Subjective Get about 2-2.5 good hours with the Carbidopa-levodopa.  When first dx with PD, went to [facility] and still do.  Recently, have had some challenges.  Have had someone refer me to your center recently.  Still drive and work.  Pt reports increased LUE tremors, which quiet down with medication on-time.  At times, have slowed movement, heaviness of LLE.  Have difficulty at times with getting clothes on due to decreased coordination of LUE.   ? Patient Stated Goals Pt's goal for therapy is to stay moving well, "stay intact"   ? Currently in  Pain? No/denies   occasional pain in L hip  ? ?  ?  ? ?  ? ? ? ? ? [facility] PT Assessment - 04/22/21 0855   ? ?  ? Assessment  ? Medical Diagnosis Parkinson's disease   ? Referring Provider (PT) Alonza Bogus, DO   ? Onset Date/Surgical Date 04/21/21   ? Hand Dominance Right   ?  ? Precautions  ? Precautions Fall   ?  ? Balance Screen  ? Has the patient fallen in the past 6 months No   ? Has the patient had a decrease in activity level because of a fear of falling?  No   ? Is the patient reluctant to leave their home because of a fear of falling?  No   ?  ? Home Environment  ? Living Environment Private residence   ? Living Arrangements Alone   ? Available Help at Discharge --   Cares for elderly mother (who lives at [facility])  ? Type of Home House   Townhouse  ? Home Access Level entry   ? Home Layout Two level;Bed/bath upstairs   ? Home Equipment Cane - single point   Considering getting stair lift for home  ?  ?  Prior Function  ? Level of Independence Independent   ? Vocation Part time employment   ? Vocation Requirements 3 days/week work at [employer]   ? Leisure Works out at [employer] 2x/wk   ?  ? Observation/Other Assessments  ? Focus on Therapeutic Outcomes (FOTO)  NA   ?  ? Posture/Postural Control  ? Posture/Postural Control Postural limitations   ? Postural Limitations Rounded Shoulders   in sitting; able to correct  ? Posture Comments LUE tremors noted   ?  ? Tone  ? Assessment Location Right Lower Extremity;Left Lower Extremity   ?  ? ROM / Strength  ? AROM / PROM / Strength Strength   ?  ? Strength  ? Overall Strength Within functional limits for tasks performed   ? Overall Strength Comments for BLEs MMT; decreased functional strength noted-see below   ?  ? Transfers  ? Transfers Sit to Stand;Stand to Sit   ? Sit to Stand 6: Modified independent (Device/Increase time);Without upper extremity assist;From chair/3-in-1   ? Five time sit to stand comments  13.22   wide BOS with feet  ? Stand to Sit  6: Modified independent (Device/Increase time);Without upper extremity assist;To chair/3-in-1   ?  ? Ambulation/Gait  ? Ambulation/Gait Yes   ? Ambulation/Gait Assistance 7: Independent   ? Ambulation Distance (Feet) 120 Feet   ? Assistive device None   ? Gait Pattern Step-through pattern   ? Ambulation Surface Level;Indoor   ? Gait velocity 6.59 sec=4.98 ft/sec   ?  ? Standardized Balance Assessment  ? Standardized Balance Assessment Mini-BESTest;Timed Up and Go Test   ?  ? Mini-BESTest  ? Sit To Stand Normal: Comes to stand without use of hands and stabilizes independently.   ? Rise to Toes Normal: Stable for 3 s with maximum height.   ? Stand on one leg (left) Moderate: < 20 s   17.04, 2.43  ? Stand on one leg (right) Moderate: < 20 s   11.62, 2.22  ? Stand on one leg - lowest score 1   ? Compensatory Stepping Correction - Forward Normal: Recovers independently with a single, large step (second realignement is allowed).   ? Compensatory Stepping Correction - Backward No step, OR would fall if not caught, OR falls spontaneously.   no step  ? Compensatory Stepping Correction - Left Lateral Severe: Falls, or cannot step   no step  ? Compensatory Stepping Correction - Right Lateral Severe:  Falls, or cannot step   no step  ? Stepping Corredtion Lateral - lowest score 0   ? Stance - Feet together, eyes open, firm surface  Normal: 30s   ? Stance - Feet together, eyes closed, foam surface  Moderate: < 30s   6.31  ? Incline - Eyes Closed Normal: Stands independently 30s and aligns with gravity   ? Change in Gait Speed Normal: Significantly changes walkling speed without imbalance   ? Walk with head turns - Horizontal Moderate: performs head turns with reduction in gait speed.   ? Walk with pivot turns Normal: Turns with feet close FAST (< 3 steps) with good balance.   ? Step over obstacles Normal: Able to step over box with minimal change of gait speed and with good balance.   ? Timed UP & GO with Dual Task Moderate:  Dual Task affects either counting OR walking (>10%) when compared to the TUG without Dual Task.   ? Mini-BEST total score 20   ?  ?  Timed Up and Go Test  ? Normal TUG (seconds) 8.66   ? Manual TUG (seconds) 10.09   ? Cognitive TUG (seconds) 11.66   ? TUG Comments Scores >10% difference indicate difficulty with dual tasking.   ?  ? RLE Tone  ? RLE Tone Within Functional Limits   ?  ? LLE Tone  ? LLE Tone Mild   ? ?  ?  ? ?  ? ? ? ? ? ? ? ? ? ? ? ? ? ?Objective measurements completed on examination: See above findings.  ? ? ? ? ? ? ? ? ? ? ? ? ? ? PT Education - 04/22/21 0957   ? ? Education Details PT eval results, POC   ? Person(s) Educated Patient   ? Methods Explanation   ? Comprehension Verbalized understanding   ? ?  ?  ? ?  ? ? ? ? ? ? PT Long Term Goals - 04/22/21 1003   ? ?  ? PT LONG TERM GOAL #1  ? Title Pt will be independent with HEP for improved strength, balance, transfers, and gait.  TARGET 05/21/2021   ? Time 4   ? Period Weeks   ? Status New   ?  ? PT LONG TERM GOAL #2  ? Title Pt will improve 5x sit<>stand to less than or equal to 11.5 sec to demonstrate improved functional strength and transfer efficiency.   ? Baseline 13.22 sec   ? Time 4   ? Period Weeks   ? Status New   ?  ? PT LONG TERM GOAL #3  ? Title Pt will improve TUG/TUG cognitive score to less than or equal to 10% difference sec for decreased fall risk, improved dual tasking.   ? Baseline 8.66 sec, cog 11.66 sec   ? Time 4   ? Period Weeks   ? Status New   ?  ? PT LONG TERM GOAL #4  ? Title Pt will improve MiniBESTest score to at least 22/28 to decrease fall risk.   ? Baseline 20/28   ? Time 4   ? Period Weeks   ? Status New   ?  ? PT LONG TERM GOAL #5  ? Title Pt will verbalize understanding of local Parkinson?s disease community resources.   ? Time 4   ? Period Weeks   ? Status New   ? ?  ?  ? ?  ? ? ? ? ? ? ? ? ? Plan - 04/22/21 0959   ? ? Clinical Impression Statement Pt is a 66 yo male with history of Parkinson's disease, referred to  OPPT by Dr. Carles Collet.  Pt presents to OPPT with LUE tremors, LLE ridigidy, bradyinesia, abnormal posture, postural instability, decreased balance, decreased timing and coordination of gait, decreased functio

## 2021-04-27 ENCOUNTER — Encounter: Payer: Self-pay | Admitting: Physical Therapy

## 2021-04-27 ENCOUNTER — Telehealth: Payer: Self-pay | Admitting: Neurology

## 2021-04-27 ENCOUNTER — Ambulatory Visit: Payer: Medicare HMO | Admitting: Physical Therapy

## 2021-04-27 DIAGNOSIS — R293 Abnormal posture: Secondary | ICD-10-CM

## 2021-04-27 DIAGNOSIS — G20 Parkinson's disease: Secondary | ICD-10-CM | POA: Diagnosis not present

## 2021-04-27 DIAGNOSIS — R29818 Other symptoms and signs involving the nervous system: Secondary | ICD-10-CM | POA: Diagnosis not present

## 2021-04-27 DIAGNOSIS — R2689 Other abnormalities of gait and mobility: Secondary | ICD-10-CM | POA: Diagnosis not present

## 2021-04-27 DIAGNOSIS — R2681 Unsteadiness on feet: Secondary | ICD-10-CM

## 2021-04-27 NOTE — Telephone Encounter (Signed)
The following message was left with [employer] on 04/27/21 at 12:32pm.  ? ?Caller states he has PD and he heard an injection of Botox in the arm may help with his shaking. ? ?He'd like to consult with Dr. Carles Collet about this.  ? ?Please advise? ?

## 2021-04-27 NOTE — Telephone Encounter (Signed)
Called patient and gave Dr. Tats recommendations  

## 2021-04-27 NOTE — Therapy (Signed)
[place] ?[facility] ?[address], [address] ?[place], [place], [address] ?Phone: [phone]   Fax:  [phone] ? ?Physical Therapy Treatment ? ?Patient Details  ?Name: Dan [name] ?MRN: [id number] ?Date of Birth: [birth year] ?Referring Provider (PT): Alonza Bogus, DO ? ? ?Encounter Date: 04/27/2021 ? ? PT End of Session - 04/27/21 0844   ? ? Visit Number 2   ? Number of Visits 9   ? Date for PT Re-Evaluation 05/21/21   ? Authorization Type [employer] Medicare-request completed at eval   ? PT Start Time [phone]   ? PT Stop Time 0926   ? PT Time Calculation (min) 43 min   ? Activity Tolerance Patient tolerated treatment well   ? Behavior During Therapy [facility] for tasks assessed/performed   ? ?  ?  ? ?  ? ? ?Past Medical History:  ?Diagnosis Date  ? Allergy   ? seasonal  ? Anxiety   ? Bleeding hemorrhoid 05/24/2010  ? Depression 08/11/2006  ? Anxiety as well around down market- tough time with job loss in [employer]    ? Hyperlipidemia LDL goal < 130 05/24/2010  ? Irritable bowel syndrome 08/29/2007  ? Improved with diet change    ? Parkinson disease ([place])   ? since 2016  ? ? ?Past Surgical History:  ?Procedure Laterality Date  ? APPENDECTOMY    ? COLONOSCOPY  05-01-00  ? HERNIA REPAIR  2014  ? Bil inguinal hernia repair  ? ROOT CANAL    ? TONSILLECTOMY  1958  ? ? ?There were no vitals filed for this visit. ? ? Subjective Assessment - 04/27/21 0845   ? ? Subjective Already been to the gym today, so I'm warmed up.  Mostly worked on upper body strengthening and some lower extremity stretching.   ? Patient Stated Goals Pt's goal for therapy is to stay moving well, "stay intact"   ? Currently in Pain? No/denies   ? ?  ?  ? ?  ? ? ? ? ? ? ? ? ? ? ? ? ?Pt performs PWR! Moves in seated position x 10-15 reps ?  ?PWR! Up for improved posture ? ?PWR! Rock for improved weighshifting ? ?PWR! Twist for improved trunk rotation  ? ?PWR! Step for improved step initiation  ? ?Cues provided for technique,  intensity of motion ? ? ? ? ?Pt performs PWR! Moves in standing position x 10 reps ?  ?PWR! Up for improved posture ? ?PWR! Rock for improved weighshifting ? ?PWR! Twist for improved trunk rotation  ? ?PWR! Step for improved step initiation  ? ?Cues provided for technique, intensity of motion ? ?Pt rates effort level as 1-2/10, and he does a good job of full, whole body motion.  Explained rationale of PWR! Moves in relation to daily activities.  ? ? ? ? ? ? ? ? ? [place] Adult PT Treatment/Exercise - 04/27/21 0001   ? ?  ? Ambulation/Gait  ? Ambulation/Gait Yes   ? Ambulation/Gait Assistance 7: Independent   ? Ambulation Distance (Feet) 400 Feet   ? Assistive device None   ? Gait Pattern Step-through pattern   ? Ambulation Surface Level;Indoor   ? Gait Comments Cues for posture, arm swing, step length in relation to walking for exercise   ? ?  ?  ? ?  ? ? ? ? ? ? Balance Exercises - 04/27/21 0001   ? ?  ? Balance Exercises: Standing  ? Stepping Strategy Anterior;Posterior;10  reps   ? ?  ?  ? ?  ? ? ? ? ? PT Education - 04/27/21 0909   ? ? Education Details Initiated HEP for [employer]! Moves in sitting and standing   ? Person(s) Educated Patient   ? Methods Explanation;Demonstration;Handout   ? Comprehension Verbalized understanding;Returned demonstration   ? ?  ?  ? ?  ? ? ? ? ? ? PT Long Term Goals - 04/22/21 1003   ? ?  ? PT LONG TERM GOAL #1  ? Title Pt will be independent with HEP for improved strength, balance, transfers, and gait.  TARGET 05/21/2021   ? Time 4   ? Period Weeks   ? Status New   ?  ? PT LONG TERM GOAL #2  ? Title Pt will improve 5x sit<>stand to less than or equal to 11.5 sec to demonstrate improved functional strength and transfer efficiency.   ? Baseline 13.22 sec   ? Time 4   ? Period Weeks   ? Status New   ?  ? PT LONG TERM GOAL #3  ? Title Pt will improve TUG/TUG cognitive score to less than or equal to 10% difference sec for decreased fall risk, improved dual tasking.   ? Baseline 8.66 sec, cog  11.66 sec   ? Time 4   ? Period Weeks   ? Status New   ?  ? PT LONG TERM GOAL #4  ? Title Pt will improve MiniBESTest score to at least 22/28 to decrease fall risk.   ? Baseline 20/28   ? Time 4   ? Period Weeks   ? Status New   ?  ? PT LONG TERM GOAL #5  ? Title Pt will verbalize understanding of local Parkinson?s disease community resources.   ? Time 4   ? Period Weeks   ? Status New   ? ?  ?  ? ?  ? ? ? ? ? ? ? ? Plan - 04/27/21 0926   ? ? Clinical Impression Statement Initiated PWR! Moves sitting and standing for large amplitude, whole body movement patterns, with focus on increased intensity of movement.  Pt responds well to exercises and cues today.  He does report that it is a good time in relation to his medications and there are times where he doesn't move as well in the medication cycle.  Encouraged him to try to schedule a PT appointment during a more off-time in order to address movement deficits during those times.   ? Personal Factors and Comorbidities Comorbidity 3+   ? Comorbidities PMH:  anxiety, IBS, HLD   ? Examination-Activity Limitations Locomotion Level;Transfers;Stand;Dressing;Caring for Others   ? Examination-Participation Restrictions Community Activity   ? Stability/Clinical Decision Making Evolving/Moderate complexity   ? Rehab Potential Good   ? PT Frequency 2x / week   ? PT Duration 4 weeks   plus eval  ? PT Treatment/Interventions ADLs/Self Care Home Management;Gait training;Functional mobility training;Therapeutic activities;Therapeutic exercise;Balance training;Neuromuscular re-education;DME Instruction;Patient/family education   ? PT Next Visit Plan Review PWR! Moves-standing, sitting; sit<>stand transfers for functional strength; educate on large amplitude, high intensity movement patterns   ? Consulted and Agree with Plan of Care Patient   ? ?  ?  ? ?  ? ? ?Patient will benefit from skilled therapeutic intervention in order to improve the following deficits and impairments:   Abnormal gait, Decreased balance, Decreased mobility, Decreased strength, Postural dysfunction ? ?Visit Diagnosis: ?Unsteadiness on feet ? ?Abnormal posture ? ?  Other abnormalities of gait and mobility ? ? ? ? ?Problem List ?Patient Active Problem List  ? Diagnosis Date Noted  ? History of colon polyps 01/08/2018  ? Hypotestosteronemia 11/05/2015  ? Low serum vitamin D 04/16/2015  ? Elevated PSA 10/30/2014  ? Parkinsonism ([place]) 10/30/2014  ? Hyperlipidemia 05/24/2010  ? Bleeding hemorrhoid 05/24/2010  ? Irritable bowel syndrome 08/29/2007  ? Anxiety state 08/11/2006  ? ? ?Deandrea Vanpelt W., PT ?04/27/2021, 9:33 AM ? ? ?[facility] ?[address], [address] ?[place], [place], [address] ?Phone: [phone]   Fax:  [phone] ? ?Name: Dan [name] ?MRN: [id number] ?Date of Birth: 1955-09-02 ? ? ? ?

## 2021-04-29 ENCOUNTER — Ambulatory Visit: Payer: Medicare HMO | Admitting: Physical Therapy

## 2021-04-29 ENCOUNTER — Encounter: Payer: Self-pay | Admitting: Physical Therapy

## 2021-04-29 DIAGNOSIS — R293 Abnormal posture: Secondary | ICD-10-CM

## 2021-04-29 DIAGNOSIS — R2689 Other abnormalities of gait and mobility: Secondary | ICD-10-CM | POA: Diagnosis not present

## 2021-04-29 DIAGNOSIS — R2681 Unsteadiness on feet: Secondary | ICD-10-CM | POA: Diagnosis not present

## 2021-04-29 DIAGNOSIS — R29818 Other symptoms and signs involving the nervous system: Secondary | ICD-10-CM

## 2021-04-29 DIAGNOSIS — G20 Parkinson's disease: Secondary | ICD-10-CM | POA: Diagnosis not present

## 2021-04-29 NOTE — Therapy (Signed)
[place] ?[facility] ?[address], [address] ?[place], [place], [address] ?Phone: [phone]   Fax:  [phone] ? ?Physical Therapy Treatment ? ?Patient Details  ?Name: Dan [name] ?MRN: [id number] ?Date of Birth: 09-23-1955 ?Referring Provider (PT): Alonza Bogus, DO ? ? ?Encounter Date: 04/29/2021 ? ? PT End of Session - 04/29/21 0911   ? ? Visit Number 3   ? Number of Visits 9   ? Date for PT Re-Evaluation 05/21/21   ? Authorization Type [employer] Medicare-request completed at eval   ? PT Start Time 0848   ? PT Stop Time 9892   ? PT Time Calculation (min) 44 min   ? Activity Tolerance Patient tolerated treatment well   ? Behavior During Therapy [facility] for tasks assessed/performed   ? ?  ?  ? ?  ? ? ?Past Medical History:  ?Diagnosis Date  ? Allergy   ? seasonal  ? Anxiety   ? Bleeding hemorrhoid 05/24/2010  ? Depression 08/11/2006  ? Anxiety as well around down market- tough time with job loss in [employer]    ? Hyperlipidemia LDL goal < 130 05/24/2010  ? Irritable bowel syndrome 08/29/2007  ? Improved with diet change    ? Parkinson disease ([place])   ? since 2016  ? ? ?Past Surgical History:  ?Procedure Laterality Date  ? APPENDECTOMY    ? COLONOSCOPY  05-01-00  ? HERNIA REPAIR  2014  ? Bil inguinal hernia repair  ? ROOT CANAL    ? TONSILLECTOMY  1958  ? ? ?There were no vitals filed for this visit. ? ? Subjective Assessment - 04/29/21 0850   ? ? Subjective Had a lot of fatigue after the session the other day.  Decided that it might be too much for the gym and PT in one day.  Waiting for my medication to kick in.   ? Patient Stated Goals Pt's goal for therapy is to stay moving well, "stay intact"   ? Currently in Pain? No/denies   ? ?  ?  ? ?  ? ? ? ? ? ? ? ? ?Pt performs PWR! Moves in seated position x 10-15 reps-Review of HEP from last visit.  PT provides initial cues  ?  ?PWR! Up for improved posture ?  ?PWR! Rock for improved weighshifting ?  ?PWR! Twist for improved trunk rotation   ?  ?PWR! Step for improved step initiation  ?  ?Cues provided for technique, intensity of motion ?  ?  ?  ?  ?Pt performs PWR! Moves in standing position x 10 reps ?  ?PWR! Up for improved posture ?  ?PWR! Rock for improved weighshifting ?  ?PWR! Twist for improved trunk rotation  ?  ?PWR! Step for improved step initiation  ?  ?Cues provided for technique, intensity of motion ?  ?Reviewed rationale of PWR! Moves in relation to daily activities. ? ? ? ? ? ? ? ? ? ? ? ? [place] Adult PT Treatment/Exercise - 04/29/21 0001   ? ?  ? Transfers  ? Transfers Sit to Stand;Stand to Sit   ? Number of Reps 10 reps;2 sets   ? Comments from mat surfaces, progressively lower   ?  ? Ambulation/Gait  ? Stairs Yes   ? Stairs Assistance 6: Modified independent (Device/Increase time)   ? Stair Management Technique Two rails;Alternating pattern;Forwards   cues for deliberate foot placement  ? ?  ?  ? ?  ? ? ? ? ? ?  Balance Exercises - 04/29/21 0001   ? ?  ? Balance Exercises: Standing  ? Stepping Strategy Anterior;Posterior;10 reps;Limitations   ? Stepping Strategy Limitations Cues for foot placement for step back/rock on LLE (tends to take too big of a step)   ? Step Ups Forward;6 inch;UE support 2;Limitations   ? Step Ups Limitations Cues for 3 sec hold with step ups   ? ?  ?  ? ?  ? ? ? ? ? PT Education - 04/29/21 0932   ? ? Education Details Initial discussion on PWR! Moves and community fitness options   ? Person(s) Educated Patient   ? Methods Explanation   ? Comprehension Verbalized understanding   ? ?  ?  ? ?  ? ? ? ? ? ? PT Long Term Goals - 04/22/21 1003   ? ?  ? PT LONG TERM GOAL #1  ? Title Pt will be independent with HEP for improved strength, balance, transfers, and gait.  TARGET 05/21/2021   ? Time 4   ? Period Weeks   ? Status New   ?  ? PT LONG TERM GOAL #2  ? Title Pt will improve 5x sit<>stand to less than or equal to 11.5 sec to demonstrate improved functional strength and transfer efficiency.   ? Baseline 13.22 sec   ?  Time 4   ? Period Weeks   ? Status New   ?  ? PT LONG TERM GOAL #3  ? Title Pt will improve TUG/TUG cognitive score to less than or equal to 10% difference sec for decreased fall risk, improved dual tasking.   ? Baseline 8.66 sec, cog 11.66 sec   ? Time 4   ? Period Weeks   ? Status New   ?  ? PT LONG TERM GOAL #4  ? Title Pt will improve MiniBESTest score to at least 22/28 to decrease fall risk.   ? Baseline 20/28   ? Time 4   ? Period Weeks   ? Status New   ?  ? PT LONG TERM GOAL #5  ? Title Pt will verbalize understanding of local Parkinson?s disease community resources.   ? Time 4   ? Period Weeks   ? Status New   ? ?  ?  ? ?  ? ? ? ? ? ? ? ? Plan - 04/29/21 0912   ? ? Clinical Impression Statement Review of HEP given last visit, with pt return demo understanding with cues.   Pt reports having a slightly harder time this morning in the cycle of his medicaiton routine, but he does a good job with PWR! Moves exercises.  He does need additional cues today for slower, more deliberate pace with stepping and step-up activities today.   ? Personal Factors and Comorbidities Comorbidity 3+   ? Comorbidities PMH:  anxiety, IBS, HLD   ? Examination-Activity Limitations Locomotion Level;Transfers;Stand;Dressing;Caring for Others   ? Examination-Participation Restrictions Community Activity   ? Stability/Clinical Decision Making Evolving/Moderate complexity   ? Rehab Potential Good   ? PT Frequency 2x / week   ? PT Duration 4 weeks   plus eval  ? PT Treatment/Interventions ADLs/Self Care Home Management;Gait training;Functional mobility training;Therapeutic activities;Therapeutic exercise;Balance training;Neuromuscular re-education;DME Instruction;Patient/family education   ? PT Next Visit Plan Review PWR! Moves-standing, sitting as needed; sit<>stand transfers for functional strength; Balance, step strategy work on solid/compliant surfaces, educate on community PD information-PWR! Moves exercise classes.  BED MOBILITY   ?  Consulted and Agree  with Plan of Care Patient   ? ?  ?  ? ?  ? ? ?Patient will benefit from skilled therapeutic intervention in order to improve the following deficits and impairments:  Abnormal gait, Decreased balance, Decreased mobility, Decreased strength, Postural dysfunction ? ?Visit Diagnosis: ?Unsteadiness on feet ? ?Abnormal posture ? ?Other symptoms and signs involving the nervous system ? ? ? ? ?Problem List ?Patient Active Problem List  ? Diagnosis Date Noted  ? History of colon polyps 01/08/2018  ? Hypotestosteronemia 11/05/2015  ? Low serum vitamin D 04/16/2015  ? Elevated PSA 10/30/2014  ? Parkinsonism ([place]) 10/30/2014  ? Hyperlipidemia 05/24/2010  ? Bleeding hemorrhoid 05/24/2010  ? Irritable bowel syndrome 08/29/2007  ? Anxiety state 08/11/2006  ? ? ?Kandas Oliveto W., PT ?04/29/2021, 9:48 AM ? ?[place] ?[facility] ?[address], [address] ?[place], [place], [address] ?Phone: [phone]   Fax:  [phone] ? ?Name: Dan [name] ?MRN: [id number] ?Date of Birth: [birth year] ? ? ? ?

## 2021-05-04 ENCOUNTER — Ambulatory Visit: Payer: Medicare HMO | Attending: Neurology | Admitting: Physical Therapy

## 2021-05-04 DIAGNOSIS — R2689 Other abnormalities of gait and mobility: Secondary | ICD-10-CM | POA: Diagnosis not present

## 2021-05-04 DIAGNOSIS — R2681 Unsteadiness on feet: Secondary | ICD-10-CM | POA: Diagnosis not present

## 2021-05-04 DIAGNOSIS — R293 Abnormal posture: Secondary | ICD-10-CM | POA: Insufficient documentation

## 2021-05-04 DIAGNOSIS — R29818 Other symptoms and signs involving the nervous system: Secondary | ICD-10-CM | POA: Insufficient documentation

## 2021-05-04 NOTE — Therapy (Signed)
[place] ?[facility] ?[address], [address] ?[place], [place], [address] ?Phone: [phone]   Fax:  [phone] ? ?Physical Therapy Treatment ? ?Patient Details  ?Name: Dan [name] ?MRN: [id number] ?Date of Birth: [birth year] ?Referring Provider (PT): Alonza Bogus, DO ? ? ?Encounter Date: 05/04/2021 ? ? PT End of Session - 05/04/21 0933   ? ? Visit Number 4   ? Number of Visits 9   ? Date for PT Re-Evaluation 05/21/21   ? Authorization Type [employer] Medicare-request completed at eval   ? PT Start Time [phone]   ? PT Stop Time 0929   ? PT Time Calculation (min) 42 min   ? Activity Tolerance Patient tolerated treatment well   ? Behavior During Therapy [facility] for tasks assessed/performed   ? ?  ?  ? ?  ? ? ?Past Medical History:  ?Diagnosis Date  ? Allergy   ? seasonal  ? Anxiety   ? Bleeding hemorrhoid 05/24/2010  ? Depression 08/11/2006  ? Anxiety as well around down market- tough time with job loss in [employer]    ? Hyperlipidemia LDL goal < 130 05/24/2010  ? Irritable bowel syndrome 08/29/2007  ? Improved with diet change    ? Parkinson disease ([place])   ? since 2016  ? ? ?Past Surgical History:  ?Procedure Laterality Date  ? APPENDECTOMY    ? COLONOSCOPY  05-01-00  ? HERNIA REPAIR  2014  ? Bil inguinal hernia repair  ? ROOT CANAL    ? TONSILLECTOMY  1958  ? ? ?There were no vitals filed for this visit. ? ? Subjective Assessment - 05/04/21 0848   ? ? Subjective Took my medication a little earlier today, so it may wear off during our session.   ? Patient Stated Goals Pt's goal for therapy is to stay moving well, "stay intact"   ? Currently in Pain? No/denies   ? ?  ?  ? ?  ? ? ? ? ? ?Practiced bed mobility: ?Pt goes sit>long sit>supine, independent ?He demonstrates rolling supine>left sidelying>right sidelying, with extra time ?Supine>sit, with increased effort and momentum ? ? ?Pt performs PWR! Moves in supine position x 10 reps, performed ?  ?PWR! Up for improved posture ? ?PWR! Rock for  improved weighshifting ? ?PWR! Twist for improved trunk rotation  ? ?PWR! Step for improved step initiation  ? ?Cues provided for technique, amplitude of movement.  Extra time and cues for technique and for intensity.  Discussed rationale for PWR! Moves supine in relation to bed mobility. ? ? ? ? ?Pt performs PWR! Moves in prone position x 5 reps ?  ?PWR! Up for improved posture ? ?PWR! Rock for improved weighshifting ? ?PWR! Twist for improved trunk rotation  ? ?PWR! Step for improved step initiation  ? ?Cues provided for technique and positioning ? ? ? ? ? ? ? ? ? ? ? ? ? ? ? ? [facility] [facility] Adult PT Treatment/Exercise - 05/04/21 0001   ? ?  ? Self-Care  ? Self-Care Other Self-Care Comments   ? Other Self-Care Comments  Provided information on PWR! Moves community exercise classes-at [employer] and at [facility], benefits of class participation.   ? ?  ?  ? ?  ? ? ? ? ? ? ? ? ? ? PT Education - 05/04/21 0932   ? ? Education Details information on PWR! MOves community exercise class options   ? Person(s) Educated Patient   ? Methods Explanation;Handout   ?  Comprehension Verbalized understanding;Returned demonstration   ? ?  ?  ? ?  ? ? ? ? ? ? PT Long Term Goals - 04/22/21 1003   ? ?  ? PT LONG TERM GOAL #1  ? Title Pt will be independent with HEP for improved strength, balance, transfers, and gait.  TARGET 05/21/2021   ? Time 4   ? Period Weeks   ? Status New   ?  ? PT LONG TERM GOAL #2  ? Title Pt will improve 5x sit<>stand to less than or equal to 11.5 sec to demonstrate improved functional strength and transfer efficiency.   ? Baseline 13.22 sec   ? Time 4   ? Period Weeks   ? Status New   ?  ? PT LONG TERM GOAL #3  ? Title Pt will improve TUG/TUG cognitive score to less than or equal to 10% difference sec for decreased fall risk, improved dual tasking.   ? Baseline 8.66 sec, cog 11.66 sec   ? Time 4   ? Period Weeks   ? Status New   ?  ? PT LONG TERM GOAL #4  ? Title Pt will improve MiniBESTest score to at least 22/28  to decrease fall risk.   ? Baseline 20/28   ? Time 4   ? Period Weeks   ? Status New   ?  ? PT LONG TERM GOAL #5  ? Title Pt will verbalize understanding of local Parkinson?s disease community resources.   ? Time 4   ? Period Weeks   ? Status New   ? ?  ?  ? ?  ? ? ? ? ? ? ? ? Plan - 05/04/21 0933   ? ? Clinical Impression Statement Continued large amplitude movement patterns in supine and prone positions.  Pt tolerates well and is very interested in how these exercises relate to function and how he can participate in PWR! Moves exercise classes.  Educated in community options.   ? Personal Factors and Comorbidities Comorbidity 3+   ? Comorbidities PMH:  anxiety, IBS, HLD   ? Examination-Activity Limitations Locomotion Level;Transfers;Stand;Dressing;Caring for Others   ? Examination-Participation Restrictions Community Activity   ? Stability/Clinical Decision Making Evolving/Moderate complexity   ? Rehab Potential Good   ? PT Frequency 2x / week   ? PT Duration 4 weeks   plus eval  ? PT Treatment/Interventions ADLs/Self Care Home Management;Gait training;Functional mobility training;Therapeutic activities;Therapeutic exercise;Balance training;Neuromuscular re-education;DME Instruction;Patient/family education   ? PT Next Visit Plan sit<>stand transfers for functional strength; Balance, step strategy work on solid/compliant surfaces, educate on community PD information-PWR! Moves exercise classes. Review  BED MOBILITY as needed.  Work towards goals.   ? Consulted and Agree with Plan of Care Patient   ? ?  ?  ? ?  ? ? ?Patient will benefit from skilled therapeutic intervention in order to improve the following deficits and impairments:  Abnormal gait, Decreased balance, Decreased mobility, Decreased strength, Postural dysfunction ? ?Visit Diagnosis: ?Abnormal posture ? ?Other symptoms and signs involving the nervous system ? ?Other abnormalities of gait and mobility ? ? ? ? ?Problem List ?Patient Active Problem List   ? Diagnosis Date Noted  ? History of colon polyps 01/08/2018  ? Hypotestosteronemia 11/05/2015  ? Low serum vitamin D 04/16/2015  ? Elevated PSA 10/30/2014  ? Parkinsonism ([place]) 10/30/2014  ? Hyperlipidemia 05/24/2010  ? Bleeding hemorrhoid 05/24/2010  ? Irritable bowel syndrome 08/29/2007  ? Anxiety state 08/11/2006  ? ? ?Arvid Marengo  W., PT ?05/04/2021, 9:39 AM ? ?[place] [facility] ?[address], [address] ?[place], [place], [address] ?Phone: [phone]   Fax:  [phone] ? ?Name: ARIHAAN BELLUCCI ?MRN: [id number] ?Date of Birth: 09/16/1955 ? ? ? ?

## 2021-05-06 ENCOUNTER — Ambulatory Visit: Payer: Medicare HMO | Admitting: Physical Therapy

## 2021-05-06 ENCOUNTER — Encounter: Payer: Self-pay | Admitting: Physical Therapy

## 2021-05-06 DIAGNOSIS — R2689 Other abnormalities of gait and mobility: Secondary | ICD-10-CM

## 2021-05-06 DIAGNOSIS — R29818 Other symptoms and signs involving the nervous system: Secondary | ICD-10-CM | POA: Diagnosis not present

## 2021-05-06 DIAGNOSIS — R293 Abnormal posture: Secondary | ICD-10-CM | POA: Diagnosis not present

## 2021-05-06 DIAGNOSIS — R2681 Unsteadiness on feet: Secondary | ICD-10-CM | POA: Diagnosis not present

## 2021-05-06 NOTE — Therapy (Signed)
[place] ?[facility] ?[address], [address] ?[place], [place], [address] ?Phone: [phone]   Fax:  [phone] ? ?Physical Therapy Treatment ? ?Patient Details  ?Name: Dan [name] ?MRN: [id number] ?Date of Birth: 1955/12/14 ?Referring Provider (PT): Alonza Bogus, DO ? ? ?Encounter Date: 05/06/2021 ? ? PT End of Session - 05/06/21 0802   ? ? Visit Number 5   ? Number of Visits 9   ? Date for PT Re-Evaluation 05/21/21   ? Authorization Type [employer] Medicare-request completed at eval   ? PT Start Time 0805   ? PT Stop Time 5809   ? PT Time Calculation (min) 42 min   ? Activity Tolerance Patient tolerated treatment well   ? Behavior During Therapy [facility] for tasks assessed/performed   ? ?  ?  ? ?  ? ? ?Past Medical History:  ?Diagnosis Date  ? Allergy   ? seasonal  ? Anxiety   ? Bleeding hemorrhoid 05/24/2010  ? Depression 08/11/2006  ? Anxiety as well around down market- tough time with job loss in [employer]    ? Hyperlipidemia LDL goal < 130 05/24/2010  ? Irritable bowel syndrome 08/29/2007  ? Improved with diet change    ? Parkinson disease ([place])   ? since 2016  ? ? ?Past Surgical History:  ?Procedure Laterality Date  ? APPENDECTOMY    ? COLONOSCOPY  05-01-00  ? HERNIA REPAIR  2014  ? Bil inguinal hernia repair  ? ROOT CANAL    ? TONSILLECTOMY  1958  ? ? ?There were no vitals filed for this visit. ? ? Subjective Assessment - 05/06/21 0806   ? ? Subjective Went to [employer] and went on a tour of the gym.  Joined the other day.   ? Patient Stated Goals Pt's goal for therapy is to stay moving well, "stay intact"   ? Currently in Pain? No/denies   ? ?  ?  ? ?  ? ? ? ? ? ? ? ? ? ? ? ? ? ? ? ? ? ? ? ? ? ? ? ? ? Balance Exercises - 05/06/21 0001   ? ?  ? Balance Exercises: Standing  ? Standing Eyes Opened Wide (BOA);Narrow base of support (BOS);Solid surface;5 reps;Limitations;Foam/compliant surface   ? Standing Eyes Opened Limitations Head turns/nods   ? Standing Eyes Closed Wide (BOA);Narrow  base of support (BOS);Solid surface;5 reps;Limitations;Foam/compliant surface   ? Standing Eyes Closed Limitations Head turns/nods; on foam, EC head steady x 30 sec   ? SLS Eyes open;10 secs;Intermittent upper extremity support;4 reps   ? SLS with Vectors Foam/compliant surface;Intermittent upper extremity assist;Limitations   ? SLS with Vectors Limitations Alternating step taps to 12" step, then forward step>back step, x 10 reps each   ? Stepping Strategy Anterior;Posterior;10 reps;Limitations;Lateral;Foam/compliant surface;UE support   ? Stepping Strategy Limitations Cues for foot clearance; conversation tasks during stepping strategy activities   ? Partial Tandem Stance Eyes open;Eyes closed;5 reps;Limitations   ? Partial Tandem Stance Limitations Head turns/nods with EO, EC head steady x 10 sec   ? Marching Foam/compliant surface;Intermittent upper extremity assist;Static;10 reps   ? ?  ?  ? ?  ? ? ? ? ? PT Education - 05/06/21 0843   ? ? Education Details Educated on Optimal PD Ex program   ? Person(s) Educated Patient   ? Methods Explanation;Handout   ? Comprehension Verbalized understanding;Returned demonstration   ? ?  ?  ? ?  ? ? ? ? ? ?  PT Long Term Goals - 04/22/21 1003   ? ?  ? PT LONG TERM GOAL #1  ? Title Pt will be independent with HEP for improved strength, balance, transfers, and gait.  TARGET 05/21/2021   ? Time 4   ? Period Weeks   ? Status New   ?  ? PT LONG TERM GOAL #2  ? Title Pt will improve 5x sit<>stand to less than or equal to 11.5 sec to demonstrate improved functional strength and transfer efficiency.   ? Baseline 13.22 sec   ? Time 4   ? Period Weeks   ? Status New   ?  ? PT LONG TERM GOAL #3  ? Title Pt will improve TUG/TUG cognitive score to less than or equal to 10% difference sec for decreased fall risk, improved dual tasking.   ? Baseline 8.66 sec, cog 11.66 sec   ? Time 4   ? Period Weeks   ? Status New   ?  ? PT LONG TERM GOAL #4  ? Title Pt will improve MiniBESTest score to at  least 22/28 to decrease fall risk.   ? Baseline 20/28   ? Time 4   ? Period Weeks   ? Status New   ?  ? PT LONG TERM GOAL #5  ? Title Pt will verbalize understanding of local Parkinson?s disease community resources.   ? Time 4   ? Period Weeks   ? Status New   ? ?  ?  ? ?  ? ? ? ? ? ? ? ? Plan - 05/06/21 0847   ? ? Clinical Impression Statement Skilled PT session focused on balance exercises on solid and compliant surfaces.  Pt needs intermittent to no UE support thorughout.  Educated on optimal fitness program upon d/c from PT.  Pt is progressing well towards goals.   ? Personal Factors and Comorbidities Comorbidity 3+   ? Comorbidities PMH:  anxiety, IBS, HLD   ? Examination-Activity Limitations Locomotion Level;Transfers;Stand;Dressing;Caring for Others   ? Examination-Participation Restrictions Community Activity   ? Stability/Clinical Decision Making Evolving/Moderate complexity   ? Rehab Potential Good   ? PT Frequency 2x / week   ? PT Duration 4 weeks   plus eval  ? PT Treatment/Interventions ADLs/Self Care Home Management;Gait training;Functional mobility training;Therapeutic activities;Therapeutic exercise;Balance training;Neuromuscular re-education;DME Instruction;Patient/family education   ? PT Next Visit Plan Begin assessing goals and consider going down to 1x/wk or discharge, as pt is progressing well.  [place] info on POP meetings   ? Consulted and Agree with Plan of Care Patient   ? ?  ?  ? ?  ? ? ?Patient will benefit from skilled therapeutic intervention in order to improve the following deficits and impairments:  Abnormal gait, Decreased balance, Decreased mobility, Decreased strength, Postural dysfunction ? ?Visit Diagnosis: ?Unsteadiness on feet ? ?Other symptoms and signs involving the nervous system ? ?Abnormal posture ? ?Other abnormalities of gait and mobility ? ? ? ? ?Problem List ?Patient Active Problem List  ? Diagnosis Date Noted  ? History of colon polyps 01/08/2018  ?  Hypotestosteronemia 11/05/2015  ? Low serum vitamin D 04/16/2015  ? Elevated PSA 10/30/2014  ? Parkinsonism ([place]) 10/30/2014  ? Hyperlipidemia 05/24/2010  ? Bleeding hemorrhoid 05/24/2010  ? Irritable bowel syndrome 08/29/2007  ? Anxiety state 08/11/2006  ? ? ?Lavelle Akel W., PT ?05/06/2021, 8:50 AM ? ?[place] ?[facility] ?[address] ?[place], [place], [address] ?Phone: [phone]  Fax:  [phone] ? ?Name: Dan [name] ?MRN: [id number] ?Date of Birth: 12-01-1955 ? ? ? ?

## 2021-05-06 NOTE — Patient Instructions (Signed)
Optimal Fitness Program after Therapy for People with Parkinson's Disease ? ?1)  Therapy Home Exercise Program ? -Do these Exercises DAILY as instructed by your therapist ? -Big, deliberate effort with exercises ? -These exercises are important to perform consistently, even when therapist has  finished, because these therapy exercises often address your specific  Parkinson's difficulties ? ? ?2)  Walking ? -  Work up to walking 3-5 times per week, 20-30 minutes per day ? -This can be done at home, driveway, quiet street or an indoor track ? -Focus should be on your Best posture, arm swing, step length for your best  walking pattern ? ?3)  Aerobic Exercise ? -Work up to 3-5 times per week, 30 minutes per day ? -This can be stationary bike, seated stepper machine, elliptical machine ? -Work up to 7-8/10 intensity during the exercise, at minimal to moderate    ? Resistance ?  ?  ? ?

## 2021-05-11 ENCOUNTER — Encounter: Payer: Self-pay | Admitting: Physical Therapy

## 2021-05-11 ENCOUNTER — Ambulatory Visit: Payer: Medicare HMO | Admitting: Physical Therapy

## 2021-05-11 DIAGNOSIS — R2689 Other abnormalities of gait and mobility: Secondary | ICD-10-CM | POA: Diagnosis not present

## 2021-05-11 DIAGNOSIS — R293 Abnormal posture: Secondary | ICD-10-CM | POA: Diagnosis not present

## 2021-05-11 DIAGNOSIS — R2681 Unsteadiness on feet: Secondary | ICD-10-CM | POA: Diagnosis not present

## 2021-05-11 DIAGNOSIS — R29818 Other symptoms and signs involving the nervous system: Secondary | ICD-10-CM

## 2021-05-11 NOTE — Therapy (Signed)
[place] ?[facility] ?[address], [address] ?[place], [place], [address] ?Phone: [phone]   Fax:  [phone] ? ?Physical Therapy Treatment ? ?Patient Details  ?Name: Dan [name] ?MRN: [id number] ?Date of Birth: 12-30-1955 ?Referring Provider (PT): Alonza Bogus, DO ? ? ?Encounter Date: 05/11/2021 ? ? PT End of Session - 05/11/21 0846   ? ? Visit Number 6   ? Number of Visits 9   ? Date for PT Re-Evaluation 05/21/21   ? Authorization Type [employer] Medicare-request completed at eval   ? PT Start Time [phone]   ? PT Stop Time 0929   ? PT Time Calculation (min) 42 min   ? Activity Tolerance Patient tolerated treatment well   ? Behavior During Therapy [facility] for tasks assessed/performed   ? ?  ?  ? ?  ? ? ?Past Medical History:  ?Diagnosis Date  ? Allergy   ? seasonal  ? Anxiety   ? Bleeding hemorrhoid 05/24/2010  ? Depression 08/11/2006  ? Anxiety as well around down market- tough time with job loss in [employer]    ? Hyperlipidemia LDL goal < 130 05/24/2010  ? Irritable bowel syndrome 08/29/2007  ? Improved with diet change    ? Parkinson disease ([place])   ? since 2016  ? ? ?Past Surgical History:  ?Procedure Laterality Date  ? APPENDECTOMY    ? COLONOSCOPY  05-01-00  ? HERNIA REPAIR  2014  ? Bil inguinal hernia repair  ? ROOT CANAL    ? TONSILLECTOMY  1958  ? ? ?There were no vitals filed for this visit. ? ? Subjective Assessment - 05/11/21 0846   ? ? Subjective My medication didn't kick in like I thought it would.  Should kick in soon.   ? Patient Stated Goals Pt's goal for therapy is to stay moving well, "stay intact"   ? Currently in Pain? No/denies   ? ?  ?  ? ?  ? ? ? ? ? ? ? ? ? ? ? ? ? ? ? ? ? ? ? ? [facility] Adult PT Treatment/Exercise - 05/11/21 0001   ? ?  ? Transfers  ? Transfers Sit to Stand;Stand to Sit   ? Sit to Stand 6: Modified independent (Device/Increase time);Without upper extremity assist;From chair/3-in-1   ? Five time sit to stand comments  13.03   ? Number of Reps 2  sets;Other reps (comment)   5 reps  ? Comments cues for slightly narrowed BOS   ?  ? Therapeutic Activites   ? Therapeutic Activities Other Therapeutic Activities   ? Other Therapeutic Activities Discussed intensity level of aerobic activites, as well as rationale of aerobic activity as part of overall HEP.  Discussed strategies to move optimally during off-times with medication-including seated upper and lower body aerobic activities to warm up during off-times.  Provided info for [employer] Parkinson's group and local/online resources   ?  ? Exercises  ? Exercises Knee/Hip   ?  ? Knee/Hip Exercises: Aerobic  ? Nustep NuStep, Level 5, 4 extremities x 8 minutes, 60-65 steps/min self-selected pace, then increased to >70-75   ? ?  ?  ? ?  ? ? ? ? ? ? Balance Exercises - 05/11/21 0001   ? ?  ? Balance Exercises: Standing  ? SLS Eyes open;10 secs;Intermittent upper extremity support;Foam/compliant surface;3 reps   ? Stepping Strategy Anterior;Posterior;10 reps;Limitations;Lateral;Foam/compliant surface;UE support   ? Marching Foam/compliant surface;Intermittent upper extremity assist;Static;10 reps   ?  Other Standing Exercises On Airex, side step up and over, with coordinated arms R and L, then forward step up/up, down/down with coordinated arm movements   ? Other Standing Exercises Comments On foam:  forward/back step and weightshift x 10 reps with 1 UE support   ? ?  ?  ? ?  ? ? ? ? ? PT Education - 05/11/21 0932   ? ? Education Details POP Resources, community resources   ? Person(s) Educated Patient   ? Methods Explanation;Handout   ? Comprehension Verbalized understanding   ? ?  ?  ? ?  ? ? ? ? ? ? PT Long Term Goals - 04/22/21 1003   ? ?  ? PT LONG TERM GOAL #1  ? Title Pt will be independent with HEP for improved strength, balance, transfers, and gait.  TARGET 05/21/2021   ? Time 4   ? Period Weeks   ? Status New   ?  ? PT LONG TERM GOAL #2  ? Title Pt will improve 5x sit<>stand to less than or equal to 11.5 sec  to demonstrate improved functional strength and transfer efficiency.   ? Baseline 13.22 sec   ? Time 4   ? Period Weeks   ? Status New   ?  ? PT LONG TERM GOAL #3  ? Title Pt will improve TUG/TUG cognitive score to less than or equal to 10% difference sec for decreased fall risk, improved dual tasking.   ? Baseline 8.66 sec, cog 11.66 sec   ? Time 4   ? Period Weeks   ? Status New   ?  ? PT LONG TERM GOAL #4  ? Title Pt will improve MiniBESTest score to at least 22/28 to decrease fall risk.   ? Baseline 20/28   ? Time 4   ? Period Weeks   ? Status New   ?  ? PT LONG TERM GOAL #5  ? Title Pt will verbalize understanding of local Parkinson?s disease community resources.   ? Time 4   ? Period Weeks   ? Status New   ? ?  ?  ? ?  ? ? ? ? ? ? ? ? Plan - 05/11/21 0857   ? ? Clinical Impression Statement Skilled PT focused on aerobic warm-up activities, then balance on solid/compliant surfaces.  Pt feels he is using strategies for movement patterns throughout his day.  Educated on community resources and other strategies to help with optimal movement patterns during off-times with medications.  Pt is progressing well towards goals and anticipate likely d/c from PT next week.   ? Personal Factors and Comorbidities Comorbidity 3+   ? Comorbidities PMH:  anxiety, IBS, HLD   ? Examination-Activity Limitations Locomotion Level;Transfers;Stand;Dressing;Caring for Others   ? Examination-Participation Restrictions Community Activity   ? Stability/Clinical Decision Making Evolving/Moderate complexity   ? Rehab Potential Good   ? PT Frequency 2x / week   ? PT Duration 4 weeks   plus eval  ? PT Treatment/Interventions ADLs/Self Care Home Management;Gait training;Functional mobility training;Therapeutic activities;Therapeutic exercise;Balance training;Neuromuscular re-education;DME Instruction;Patient/family education   ? PT Next Visit Plan Check LTGs and plan for d/c next week.  Discuss return PT eval/screen   ? Consulted and Agree  with Plan of Care Patient   ? ?  ?  ? ?  ? ? ?Patient will benefit from skilled therapeutic intervention in order to improve the following deficits and impairments:  Abnormal gait, Decreased balance, Decreased mobility, Decreased strength,  Postural dysfunction ? ?Visit Diagnosis: ?Unsteadiness on feet ? ?Other abnormalities of gait and mobility ? ?Abnormal posture ? ?Other symptoms and signs involving the nervous system ? ? ? ? ?Problem List ?Patient Active Problem List  ? Diagnosis Date Noted  ? History of colon polyps 01/08/2018  ? Hypotestosteronemia 11/05/2015  ? Low serum vitamin D 04/16/2015  ? Elevated PSA 10/30/2014  ? Parkinsonism ([place]) 10/30/2014  ? Hyperlipidemia 05/24/2010  ? Bleeding hemorrhoid 05/24/2010  ? Irritable bowel syndrome 08/29/2007  ? Anxiety state 08/11/2006  ? ? ?Serita Degroote W., PT ?05/11/2021, 9:33 AM ? ?[place] ?[facility] ?[address] ?[place], [place], [address] ?Phone: [phone]   Fax:  [phone] ? ?Name: Dan [name] ?MRN: [id number] ?Date of Birth: 12/07/1955 ? ? ? ?

## 2021-05-13 ENCOUNTER — Ambulatory Visit: Payer: Medicare HMO | Admitting: Physical Therapy

## 2021-05-18 ENCOUNTER — Ambulatory Visit: Payer: Medicare HMO | Admitting: Physical Therapy

## 2021-05-18 ENCOUNTER — Encounter: Payer: Self-pay | Admitting: Physical Therapy

## 2021-05-18 DIAGNOSIS — R293 Abnormal posture: Secondary | ICD-10-CM

## 2021-05-18 DIAGNOSIS — R2681 Unsteadiness on feet: Secondary | ICD-10-CM

## 2021-05-18 DIAGNOSIS — R2689 Other abnormalities of gait and mobility: Secondary | ICD-10-CM

## 2021-05-18 DIAGNOSIS — R29818 Other symptoms and signs involving the nervous system: Secondary | ICD-10-CM

## 2021-05-18 NOTE — Therapy (Signed)
[facility] ?[address], [address] ?[place], [place], [address] ?Phone: [phone]   Fax:  [phone] ? ?Physical Therapy Treatment/Discharge Summary ? ?Patient Details  ?Name: Dan [name] ?MRN: [id number] ?Date of Birth: 1955/07/26 ?Referring Provider (PT): Alonza Bogus, DO ? ?PHYSICAL THERAPY DISCHARGE SUMMARY ? ?Visits from Start of Care: 7 ? ?Current functional level related to goals / functional outcomes: ? PT Long Term Goals - 05/18/21 0918   ? ?  ? PT LONG TERM GOAL #1  ? Title Pt will be independent with HEP for improved strength, balance, transfers, and gait.  TARGET 05/21/2021   ? Time 4   ? Period Weeks   ? Status Achieved   ?  ? PT LONG TERM GOAL #2  ? Title Pt will improve 5x sit<>stand to less than or equal to 11.5 sec to demonstrate improved functional strength and transfer efficiency.   ? Baseline 13.22 sec>11.07 sec 05/18/2021   ? Time 4   ? Period Weeks   ? Status Achieved   ?  ? PT LONG TERM GOAL #3  ? Title Pt will improve TUG/TUG cognitive score to less than or equal to 10% difference sec for decreased fall risk, improved dual tasking.   ? Baseline 8.66 sec, cog 11.66 sec; 8.16, 9.62 sec 05/18/2021   ? Time 4   ? Period Weeks   ? Status Not Met   ?  ? PT LONG TERM GOAL #4  ? Title Pt will improve MiniBESTest score to at least 22/28 to decrease fall risk.   ? Baseline 20/28>26/28 05/18/2021   ? Time 4   ? Period Weeks   ? Status Achieved   ?  ? PT LONG TERM GOAL #5  ? Title Pt will verbalize understanding of local Parkinson?s disease community resources.   ? Time 4   ? Period Weeks   ? Status Achieved   ? ?  ?  ? ?  ? ?Pt has met 4 of 5 LTGs. ?  ?Remaining deficits: ?Functional mobility variability based on medication timing ?  ?Education / Equipment: ?Educated in [employer], community fitness options  ? ?Patient agrees to discharge. Patient goals were met. Patient is being discharged due to meeting the stated rehab goals. ? Mady Haagensen, PT ?05/18/21 10:05  AM ?Phone: [phone] ?Fax: [phone]  ? ?Encounter Date: 05/18/2021 ? ? PT End of Session - 05/18/21 0847   ? ? Visit Number 7   ? Number of Visits 9   ? Date for PT Re-Evaluation 05/21/21   ? Authorization Type [employer] Medicare-request completed at eval   ? PT Start Time [phone]   ? PT Stop Time 0929   ? PT Time Calculation (min) 42 min   ? Activity Tolerance Patient tolerated treatment well   ? Behavior During Therapy [facility] for tasks assessed/performed   ? ?  ?  ? ?  ? ? ?Past Medical History:  ?Diagnosis Date  ? Allergy   ? seasonal  ? Anxiety   ? Bleeding hemorrhoid 05/24/2010  ? Depression 08/11/2006  ? Anxiety as well around down market- tough time with job loss in [place]    ? Hyperlipidemia LDL goal < 130 05/24/2010  ? Irritable bowel syndrome 08/29/2007  ? Improved with diet change    ? Parkinson disease ([place])   ? since 2016  ? ? ?Past Surgical History:  ?Procedure Laterality Date  ? APPENDECTOMY    ? COLONOSCOPY  05-01-00  ?  HERNIA REPAIR  2014  ? Bil inguinal hernia repair  ? ROOT CANAL    ? TONSILLECTOMY  1958  ? ? ?There were no vitals filed for this visit. ? ? Subjective Assessment - 05/18/21 0847   ? ? Subjective Things going okay as far as movement and working.  I'm trying to check on the timing of when I take the medication-taking a little longer to kick in and then only get a 2-hr window.  Plan to talk to Dr. Carles Collet about this.   ? Patient Stated Goals Pt's goal for therapy is to stay moving well, "stay intact"   ? Currently in Pain? No/denies   ? ?  ?  ? ?  ? ? ? ? ? ? ? ? ? ? ? ? ? ? ? ? ? ? ? ? [facility] Adult PT Treatment/Exercise - 05/18/21 0001   ? ?  ? Transfers  ? Transfers Sit to Stand;Stand to Sit   ? Sit to Stand 6: Modified independent (Device/Increase time);Without upper extremity assist;From chair/3-in-1   ? Five time sit to stand comments  11.72   ? Stand to Sit 6: Modified independent (Device/Increase time);Without upper extremity assist;To chair/3-in-1   ?  ? Standardized Balance  Assessment  ? Standardized Balance Assessment Timed Up and Go Test;Mini-BESTest   ?  ? Timed Up and Go Test  ? TUG Normal TUG;Cognitive TUG   ? Normal TUG (seconds) 8.16   ? Cognitive TUG (seconds) 9.62   ?  ? Mini-BESTest  ? Sit To Stand Normal: Comes to stand without use of hands and stabilizes independently.   ? Rise to Toes Normal: Stable for 3 s with maximum height.   ? Stand on one leg (left) Normal: 20 s.   ? Stand on one leg (right) Moderate: < 20 s   15.97, 6.97  ? Stand on one leg - lowest score 1   ? Compensatory Stepping Correction - Forward Normal: Recovers independently with a single, large step (second realignement is allowed).   ? Compensatory Stepping Correction - Backward Normal: Recovers independently with a single, large step   ? Compensatory Stepping Correction - Left Lateral Normal: Recovers independently with 1 step (crossover or lateral OK)   ? Compensatory Stepping Correction - Right Lateral Normal: Recovers independently with 1 step (crossover or lateral OK)   ? Stepping Corredtion Lateral - lowest score 2   ? Stance - Feet together, eyes open, firm surface  Normal: 30s   ? Stance - Feet together, eyes closed, foam surface  Normal: 30s   ? Incline - Eyes Closed Normal: Stands independently 30s and aligns with gravity   ? Change in Gait Speed Normal: Significantly changes walkling speed without imbalance   ? Walk with head turns - Horizontal Normal: performs head turns with no change in gait speed and good balance   ? Walk with pivot turns Normal: Turns with feet close FAST (< 3 steps) with good balance.   ? Step over obstacles Normal: Able to step over box with minimal change of gait speed and with good balance.   ? Timed UP & GO with Dual Task Moderate: Dual Task affects either counting OR walking (>10%) when compared to the TUG without Dual Task.   ? Mini-BEST total score 26   ? ?  ?  ? ?  ? ? ? ? ? ? ? ?Self Care: ?Discussed pt's continued community fitness plans, including [place]  [facility] and [name]  classes (to start this week) ? ? PT Education - 05/18/21 0946   ? ? Education Details Questions to ask neurologist at next visit; plans for return PT eval/screen and OT/speech screesn.  Discussed progress towards goals, POC and plans for d/c today.   ? Person(s) Educated Patient   ? Methods Explanation   ? Comprehension Verbalized understanding   ? ?  ?  ? ?  ? ? ? ? ? ? PT Long Term Goals - 05/18/21 0918   ? ?  ? PT LONG TERM GOAL #1  ? Title Pt will be independent with HEP for improved strength, balance, transfers, and gait.  TARGET 05/21/2021   ? Time 4   ? Period Weeks   ? Status Achieved   ?  ? PT LONG TERM GOAL #2  ? Title Pt will improve 5x sit<>stand to less than or equal to 11.5 sec to demonstrate improved functional strength and transfer efficiency.   ? Baseline 13.22 sec>11.07 sec 05/18/2021   ? Time 4   ? Period Weeks   ? Status Achieved   ?  ? PT LONG TERM GOAL #3  ? Title Pt will improve TUG/TUG cognitive score to less than or equal to 10% difference sec for decreased fall risk, improved dual tasking.   ? Baseline 8.66 sec, cog 11.66 sec; 8.16, 9.62 sec 05/18/2021   ? Time 4   ? Period Weeks   ? Status Not Met   ?  ? PT LONG TERM GOAL #4  ? Title Pt will improve MiniBESTest score to at least 22/28 to decrease fall risk.   ? Baseline 20/28>26/28 05/18/2021   ? Time 4   ? Period Weeks   ? Status Achieved   ?  ? PT LONG TERM GOAL #5  ? Title Pt will verbalize understanding of local Parkinson?s disease community resources.   ? Time 4   ? Period Weeks   ? Status Achieved   ? ?  ?  ? ?  ? ? ? ? ? ? ? ? Plan - 05/18/21 0930   ? ? Clinical Impression Statement Assessed LTGs this visit, with pt meeting 4 of 5 LTGs.  Pt has improved with functional mobility measures and demonstrates improved overall functional mobility and awareness of movement patterns.  He has made appropriate plans for continued community fitness.  He is appropriate for d/c at this time.   ? Personal Factors  and Comorbidities Comorbidity 3+   ? Comorbidities PMH:  anxiety, IBS, HLD   ? Examination-Activity Limitations Locomotion Level;Transfers;Stand;Dressing;Caring for Others   ? Examination-Participation Restrictions

## 2021-05-18 NOTE — Patient Instructions (Signed)
Questions to ask Dr. Carles Collet: ? ?-Let her know that the medications are taking slightly more than 30 minutes to kick in and then there are 2 good hours ? ?-What about possibility of taking medication later in the evening (after the 5 pm dose) in case I want to go out to a friend's house, or have evening plans, etc? ? ?-When I'm on the medication and doing well at the appointments, I feel super normal-would it be appropriate to see me off the medications at some point? ?

## 2021-05-20 ENCOUNTER — Ambulatory Visit: Payer: Medicare HMO | Admitting: Physical Therapy

## 2021-06-03 ENCOUNTER — Ambulatory Visit (INDEPENDENT_AMBULATORY_CARE_PROVIDER_SITE_OTHER): Payer: Medicare HMO | Admitting: Family Medicine

## 2021-06-03 ENCOUNTER — Encounter: Payer: Self-pay | Admitting: Family Medicine

## 2021-06-03 VITALS — BP 110/70 | HR 72 | Temp 98.7°F | Ht 75.0 in | Wt 200.4 lb

## 2021-06-03 DIAGNOSIS — Z23 Encounter for immunization: Secondary | ICD-10-CM | POA: Diagnosis not present

## 2021-06-03 DIAGNOSIS — R7989 Other specified abnormal findings of blood chemistry: Secondary | ICD-10-CM | POA: Diagnosis not present

## 2021-06-03 DIAGNOSIS — R351 Nocturia: Secondary | ICD-10-CM

## 2021-06-03 DIAGNOSIS — E785 Hyperlipidemia, unspecified: Secondary | ICD-10-CM | POA: Diagnosis not present

## 2021-06-03 DIAGNOSIS — Z0000 Encounter for general adult medical examination without abnormal findings: Secondary | ICD-10-CM

## 2021-06-03 LAB — CBC WITH DIFFERENTIAL/PLATELET
Basophils Absolute: 0 10*3/uL (ref 0.0–0.1)
Basophils Relative: 0.4 % (ref 0.0–3.0)
Eosinophils Absolute: 0.1 10*3/uL (ref 0.0–0.7)
Eosinophils Relative: 1 % (ref 0.0–5.0)
HCT: 44 % (ref 39.0–52.0)
Hemoglobin: 14.8 g/dL (ref 13.0–17.0)
Lymphocytes Relative: 28.7 % (ref 12.0–46.0)
Lymphs Abs: 2 10*3/uL (ref 0.7–4.0)
MCHC: 33.6 g/dL (ref 30.0–36.0)
MCV: 94.3 fl (ref 78.0–100.0)
Monocytes Absolute: 0.6 10*3/uL (ref 0.1–1.0)
Monocytes Relative: 8.5 % (ref 3.0–12.0)
Neutro Abs: 4.3 10*3/uL (ref 1.4–7.7)
Neutrophils Relative %: 61.4 % (ref 43.0–77.0)
Platelets: 216 10*3/uL (ref 150.0–400.0)
RBC: 4.66 Mil/uL (ref 4.22–5.81)
RDW: 13.7 % (ref 11.5–15.5)
WBC: 7 10*3/uL (ref 4.0–10.5)

## 2021-06-03 LAB — COMPREHENSIVE METABOLIC PANEL
ALT: 5 U/L (ref 0–53)
AST: 18 U/L (ref 0–37)
Albumin: 4.4 g/dL (ref 3.5–5.2)
Alkaline Phosphatase: 51 U/L (ref 39–117)
BUN: 16 mg/dL (ref 6–23)
CO2: 31 mEq/L (ref 19–32)
Calcium: 10 mg/dL (ref 8.4–10.5)
Chloride: 103 mEq/L (ref 96–112)
Creatinine, Ser: 0.87 mg/dL (ref 0.40–1.50)
GFR: 90.41 mL/min (ref >60.00)
Glucose, Bld: 98 mg/dL (ref 70–99)
Potassium: 4.9 mEq/L (ref 3.5–5.1)
Sodium: 140 mEq/L (ref 135–145)
Total Bilirubin: 0.8 mg/dL (ref 0.2–1.2)
Total Protein: 7 g/dL (ref 6.0–8.3)

## 2021-06-03 LAB — LIPID PANEL
Cholesterol: 175 mg/dL (ref 0–200)
HDL: 47.5 mg/dL (ref >39.00)
LDL Cholesterol: 102 mg/dL — ABNORMAL HIGH (ref 0–99)
NonHDL: 127.73
Total CHOL/HDL Ratio: 4
Triglycerides: 130 mg/dL (ref 0.0–149.0)
VLDL: 26 mg/dL (ref 0.0–40.0)

## 2021-06-03 LAB — VITAMIN D 25 HYDROXY (VIT D DEFICIENCY, FRACTURES): VITD: 49.03 ng/mL (ref 30.00–100.00)

## 2021-06-03 NOTE — Patient Instructions (Addendum)
Prevnar 20 today  We discussed some changes in sleep hygiene particularly cutting out TV at least 30 minutes before bedtime.  For a 2-week trial can take 1 to 3 mg of melatonin after he turns the TV off and try to get in bed within about 30 minutes.  If not improving could try cognitive behavioral therapy for insomnia or discuss other options with Dr. Carles Collet as well since he sees her soon  Please go to [facility] for labs today - located [address] across the street from [place] - in the basement - Hours: 7:30-5:30 PM M-F. You do NOT need an appointment.  - Please ensure you are covid symptom free before going in(No fever, chills, cough, congestion, runny nose, shortness of breath, fatigue, body aches, sore throat, headache, nausea, vomiting, diarrhea, or new loss of taste or smell. No known contacts with covid 19 or someone being tested for covid 19)  Recommended follow up: Return in about 6 months (around 12/03/2021) for followup or sooner if needed.Schedule b4 you leave.

## 2021-06-03 NOTE — Addendum Note (Signed)
Addended by: [name] Lundborg A on: 06/03/2021 11:55 AM   Modules accepted: Orders

## 2021-06-03 NOTE — Progress Notes (Signed)
Phone: [phone]   Subjective:  Patient presents today for their annual physical. Chief complaint-noted.   See problem oriented charting- ROS- full  review of systems was completed and negative  except for: insomnia, some back pain, seasonal allergies  The following were reviewed and entered/updated in epic: Past Medical History:  Diagnosis Date   Allergy    seasonal   Anxiety    Bleeding hemorrhoid 05/24/2010   Depression 08/11/2006   Anxiety as well around down market- tough time with job loss in [place]     Hyperlipidemia LDL goal < 130 05/24/2010   Irritable bowel syndrome 08/29/2007   Improved with diet change     Parkinson disease ([place])    since 2016   Patient Active Problem List   Diagnosis Date Noted   Hypotestosteronemia 11/05/2015    Priority: High   Elevated PSA 10/30/2014    Priority: High   Parkinson's disease ([place]) 10/30/2014    Priority: High   Low serum vitamin D 04/16/2015    Priority: Medium    Hyperlipidemia 05/24/2010    Priority: Medium    Anxiety state 08/11/2006    Priority: Medium    Bleeding hemorrhoid 05/24/2010    Priority: Low   Irritable bowel syndrome 08/29/2007    Priority: Low   History of colon polyps 01/08/2018   Past Surgical History:  Procedure Laterality Date   APPENDECTOMY     COLONOSCOPY  05-01-00   HERNIA REPAIR  2014   Bil inguinal hernia repair   ROOT CANAL     TONSILLECTOMY  1958    Family History  Problem Relation Age of Onset   Hypertension Mother    Breast cancer Mother    Ovarian cancer Mother        in her 80s   Prostate cancer Father        complications from radioactive seeds, colostomy for last 10 years    Medications- reviewed and updated Current Outpatient Medications  Medication Sig Dispense Refill   Alpha-Lipoic Acid 200 MG TABS Take by mouth daily.      Ascorbic Acid (VITAMIN C) 1000 MG tablet Take 1,000 mg by mouth daily. With rose hips     B Complex Vitamins (VITAMIN B COMPLEX PO)  Take by mouth daily.      carbidopa-levodopa (SINEMET IR) 25-100 MG tablet 2 tabs at 6am/9am/noon/3pm/5pm 1000 tablet 1   cholecalciferol (VITAMIN D) 1000 units tablet Take 1,000 Units by mouth daily. Vit d 3- Take 125 mcg daily     FOLIC ACID PO Take 170 mg by mouth.      glucosamine-chondroitin 500-400 MG tablet Take 1 tablet by mouth daily.      L-Lysine 500 MG TABS Take 1,000 mg by mouth daily.      Multiple Vitamin (MULTIVITAMIN) tablet Take 1 tablet by mouth daily.     Omega-3 Fatty Acids (FISH OIL) 1000 MG CAPS Take 1,000 mg by mouth daily.     saw palmetto 160 MG capsule Take 450 mg by mouth daily.      Turmeric (QC TUMERIC COMPLEX PO) Take by mouth.     vitamin B-12 (CYANOCOBALAMIN) 1000 MCG tablet Take 1,000 mcg by mouth daily.     No current facility-administered medications for this visit.    Allergies-reviewed and updated Allergies  Allergen Reactions   Amoxicillin Other (See Comments)    Severe fatigue    Social History   Social History Narrative   Lives alone. Long term partner - does  not live with him.       Planning September 2023 to wind things down at [employer]    Works at the [employer]    Formerly Designer, multimedia.    Goes to [employer]      Hobbies: Exercises regularly, relax with friends   Right handed   Objective  Objective:  BP 110/70   Pulse 72   Temp 98.7 F (37.1 C)   Ht '6\' 3"'$  (1.905 m)   Wt 200 lb 6.4 oz (90.9 kg)   SpO2 95%   BMI 25.05 kg/m  Gen: NAD, resting comfortably HEENT: Mucous membranes are moist. Oropharynx normal Neck: no thyromegaly CV: RRR no murmurs rubs or gallops Lungs: CTAB no crackles, wheeze, rhonchi Abdomen: soft/nontender/nondistended/normal bowel sounds. No rebound or guarding.  Ext: no edema Skin: warm, dry Neuro: grossly normal, moves all extremities, PERRLA, has cane today but did not need to get onto table today   Assessment and Plan  66 y.o.  male presenting for annual physical.  Health Maintenance counseling: 1. Anticipatory guidance: Patient counseled regarding regular dental exams -q6 months, eye exams -yearly,  avoiding smoking and second hand smoke , limiting alcohol to 2 beverages per day - once a month maybe a single beer, no illicit drugs .   2. Risk factor reduction:  Advised patient of need for regular exercise and diet rich and fruits and vegetables to reduce risk of heart attack and stroke.  Exercise- got [employer] membership- goes to park[facility] exercise on wednesdays- stretching and mobility and balance- started by [employer] with PT he says. He is going to get a trainer specific for parkinsons.  Diet/weight management-trying to improve diet- has cut back on ice cream his partner likes to eat- [employer] weight loss.  Wt Readings from Last 3 Encounters:  06/03/21 200 lb 6.4 oz (90.9 kg)  03/01/21 203 lb 3.2 oz (92.2 kg)  12/22/20 197 lb (89.4 kg)  3. Immunizations/screenings/ancillary studies- prevnar 20 toda Immunization History  Administered Date(s) Administered   Influenza Whole 01/03/2002   Influenza, High Dose Seasonal PF 10/24/2020   Influenza,inj,Quad PF,6+ Mos 08/19/2017, 09/04/2018   Influenza-Unspecified 09/21/2014, 10/08/2015, 08/31/2016, 10/04/2019   PFIZER(Purple Top)SARS-COV-2 Vaccination 03/19/2019, 04/15/2019, 10/15/2019   Pfizer Covid-19 Vaccine Bivalent Booster 54yr & up 10/24/2020   Td 10/03/1996, 11/05/2007   Tdap 11/10/2017   Zoster Recombinat (Shingrix) 05/25/2016, 10/20/2016   Zoster, Live 11/18/2015  4. Prostate cancer screening- PSA in 7s following with DR. [facility]ance [facility]- plan is for every 6 months- ast visit in january  Lab Results  Component Value Date   PSA 4.91 (H) 11/10/2017   PSA 4.64 (H) 05/09/2017   PSA 5.62 01/13/2017   5. Colon cancer screening - 12/22/17 with 5 year follow up planned 6. Skin cancer screening- [place] derm Dr. JRonnald Ramp advised regular sunscreen use.  Denies worrisome, changing, or new skin lesions.  7. Smoking associated screening (lung cancer screening, AAA screen 65-75, UA)- never smoker 8. STD screening -  only active with long term partner- opts out  Status of chronic or acute concerns   #social update- planning on winding things down in September with [employer], enjoying new gym, caring for mom at friends home. Plans to get social security starting at 66.5  #Parkinson's-follows with Dr. TCarles ColletS: Medication: Sinemet instant release 2 tablets at 6 AM, 9 AM, noon, 3 PM, 5 PM. Does well when med is working well but starts to  wear off A/P: overall doing well other then meds wearing off and "feeing older" when gest close to next dose- plans to discuss with Dr. Carles Collet    # Insomnia S:falls asleep without issue but has a hard time when wakes up once a night to pee or twice- getting back to seep . Has tried melatonin. Does watch tv up until bed A/P: We discussed some changes in sleep hygiene particularly cutting out TV at least 30 minutes before bedtime.  For a 2-week trial can take 1 to 3 mg of melatonin after he turns the TV off and try to get in bed within about 30 minutes.  If not improving could try cognitive behavioral therapy for insomnia or discuss other options with Dr. Carles Collet as well since he sees her soon  #Vitamin D deficiency S: Medication: 1000 units Last vitamin D Lab Results  Component Value Date   VD25OH 44 12/05/2019  A/P: hopefully stable- update vitamin D today. Continue current meds for now   #hyperlipidemia S: Medication:none  Lab Results  Component Value Date   CHOL 187 12/05/2019   HDL 50 12/05/2019   LDLCALC 113 (H) 12/05/2019   TRIG 129 12/05/2019   CHOLHDL 3.7 12/05/2019   A/P: mild elevation- discussed possible ct cardiac scoring- hed like to consider next year if risk remains above 7.5% or increases further  Recommended follow up: Return in about 6 months (around 12/03/2021) for followup or sooner if  needed.Schedule b4 you leave. Future Appointments  Date Time Provider [place]  06/28/2021  8:15 AM Tat, Eustace Quail, DO [facility] None  09/02/2021 11:45 AM Ellwood Dense Holli Humbles, OT [facility]  09/02/2021 12:00 PM Sharen Counter, CCC-SLP [facility]  11/11/2021  8:45 AM Gerrit Friends, Barnabas Harries, PT [facility]   Lab/Order associations:NOT fasting but lab not open- he will d   ICD-10-CM   1. Preventative health care  Z00.00     2. Hyperlipidemia, unspecified hyperlipidemia type  E78.5     3. Low serum vitamin D  R79.89     4. Nocturia  R35.1      No orders of the defined types were placed in this encounter.  Return precautions advised.  [name], MD

## 2021-06-15 ENCOUNTER — Other Ambulatory Visit (HOSPITAL_BASED_OUTPATIENT_CLINIC_OR_DEPARTMENT_OTHER): Payer: Self-pay

## 2021-06-24 NOTE — Progress Notes (Unsigned)
Assessment/Plan:   1.  Parkinsons Disease  -***carbidopa/levodopa 25/100, 2 tablets 5  times per day    2.  Sciatica, intermittent  -Doing well from the standpoint right now.  3.  Insomnia  -try melatonin, 3 mg q hs.   Subjective:   Dan [name] was seen today in follow up for Parkinsons disease.  My previous records were reviewed prior to todays visit as well as outside records available to me.  Last visit, patient really had dose escalated his levodopa (some concern for dopamine dysregulation syndrome) and I went ahead and started ropinirole, but the patient reported he just did not feel well on this.  This was discontinued.  He has done some therapy since our last visit.  Those notes are reviewed.  He continues faithfully with ACT.  He had a membership with [facility] and is exercising fair.  Current prescribed movement disorder medications: Carbidopa/levodopa 25/100,  2 tablets 5  times per day (6am/9am/noon/3pm/5pm) - pt states that he is taking 2 po 7 times per day and taking 2 po q 2 hours. Ropinirole, 1 mg 3 times per day (started December)   PREVIOUS MEDICATIONS:  pramipexole (patient thought worsen depression and cause headache/nausea); rotigotine (given it but didn't take it - worried about it due to cost/upcoming MCR); entacapone (patient stopped after thought not helping); ropinirole (patient felt had side effects at very low dosages and stopped)  ALLERGIES:   Allergies  Allergen Reactions   Amoxicillin Other (See Comments)    Severe fatigue    CURRENT MEDICATIONS:  Outpatient Encounter Medications as of 06/28/2021  Medication Sig   Alpha-Lipoic Acid 200 MG TABS Take by mouth daily.    Ascorbic Acid (VITAMIN C) 1000 MG tablet Take 1,000 mg by mouth daily. With rose hips   B Complex Vitamins (VITAMIN B COMPLEX PO) Take by mouth daily.    carbidopa-levodopa (SINEMET IR) 25-100 MG tablet 2 tabs at 6am/9am/noon/3pm/5pm   cholecalciferol (VITAMIN D) 1000 units  tablet Take 1,000 Units by mouth daily. Vit d 3- Take 154 mcg daily   FOLIC ACID PO Take 008 mg by mouth.    glucosamine-chondroitin 500-400 MG tablet Take 1 tablet by mouth daily.    L-Lysine 500 MG TABS Take 1,000 mg by mouth daily.    Multiple Vitamin (MULTIVITAMIN) tablet Take 1 tablet by mouth daily.   Omega-3 Fatty Acids (FISH OIL) 1000 MG CAPS Take 1,000 mg by mouth daily.   saw palmetto 160 MG capsule Take 450 mg by mouth daily.    Turmeric (QC TUMERIC COMPLEX PO) Take by mouth.   vitamin B-12 (CYANOCOBALAMIN) 1000 MCG tablet Take 1,000 mcg by mouth daily.   No facility-administered encounter medications on file as of 06/28/2021.    Objective:   PHYSICAL EXAMINATION:    VITALS:   There were no vitals filed for this visit.     GEN:  The patient appears stated age and is in NAD. HEENT:  Normocephalic, atraumatic.  The mucous membranes are moist.   Neurological examination:  Orientation: The patient is alert and oriented x3. Cranial nerves: There is good facial symmetry without facial hypomimia. The speech is fluent and clear. Soft palate rises symmetrically and there is no tongue deviation. Hearing is intact to conversational tone. Sensation: Sensation is intact to light touch throughout Motor: Strength is at least antigravity x4.  Movement examination: Tone: There is nl tone in the UE/LE Abnormal movements: he has rare LUE rest tremor; rare L leg  dyskinesia Coordination:  There is min decremation with RAMs only with toe taps on the L.  All other RAMs are good and WNL Gait and Station: The patient has no difficulty arising out of a deep-seated chair without the use of the hands. The patient's stride length is good with good arm swing today  I have reviewed and interpreted the following labs independently    Chemistry      Component Value Date/Time   NA 140 06/03/2021 1135   K 4.9 06/03/2021 1135   CL 103 06/03/2021 1135   CO2 31 06/03/2021 1135   BUN 16 06/03/2021  1135   CREATININE 0.87 06/03/2021 1135   CREATININE 0.92 12/05/2019 0901      Component Value Date/Time   CALCIUM 10.0 06/03/2021 1135   ALKPHOS 51 06/03/2021 1135   AST 18 06/03/2021 1135   ALT 5 06/03/2021 1135   BILITOT 0.8 06/03/2021 1135       Lab Results  Component Value Date   WBC 7.0 06/03/2021   HGB 14.8 06/03/2021   HCT 44.0 06/03/2021   MCV 94.3 06/03/2021   PLT 216.0 06/03/2021    Lab Results  Component Value Date   TSH 0.99 05/09/2017     Total time spent on today's visit was ***  minutes, including both face-to-face time and nonface-to-face time.  Time included that spent on review of records (prior notes available to me/labs/imaging if pertinent), discussing treatment and goals, answering patient's questions and coordinating care.  Cc:  Marin Olp, MD

## 2021-06-28 ENCOUNTER — Ambulatory Visit: Payer: Medicare HMO | Admitting: Neurology

## 2021-06-28 ENCOUNTER — Encounter: Payer: Self-pay | Admitting: Neurology

## 2021-06-28 VITALS — BP 140/84 | HR 72 | Ht 75.0 in | Wt 200.0 lb

## 2021-06-28 DIAGNOSIS — G4701 Insomnia due to medical condition: Secondary | ICD-10-CM | POA: Diagnosis not present

## 2021-06-28 DIAGNOSIS — G20 Parkinson's disease: Secondary | ICD-10-CM

## 2021-06-28 MED ORDER — TRAZODONE HCL 50 MG PO TABS: 50.0000 mg | ORAL_TABLET | Freq: Every day | ORAL | 1 refills | Status: DC

## 2021-07-21 ENCOUNTER — Telehealth: Payer: Self-pay | Admitting: Neurology

## 2021-07-21 NOTE — Telephone Encounter (Signed)
Called patient and told him Dr. Carles Collet recommendations

## 2021-07-21 NOTE — Telephone Encounter (Signed)
Patient called and asked if Dr. Carles Collet is willing to prescribe Klonopin, generic clonazepam for him.  He is asking for this medication because of added stress with his life taking care of his 66 year old mother who is requiring quite a bit of help from him.  He said the anxiety he is experiencing seems to be effecting how well his medication for PD is working.  Kristopher Oppenheim at [employer]

## 2021-07-29 DIAGNOSIS — G20 Parkinson's disease: Secondary | ICD-10-CM | POA: Diagnosis not present

## 2021-07-29 DIAGNOSIS — R972 Elevated prostate specific antigen [PSA]: Secondary | ICD-10-CM | POA: Diagnosis not present

## 2021-07-29 DIAGNOSIS — Z8042 Family history of malignant neoplasm of prostate: Secondary | ICD-10-CM | POA: Diagnosis not present

## 2021-08-26 ENCOUNTER — Other Ambulatory Visit: Payer: Self-pay | Admitting: Neurology

## 2021-08-26 DIAGNOSIS — G20 Parkinson's disease: Secondary | ICD-10-CM

## 2021-08-27 NOTE — Telephone Encounter (Signed)
Patient returned call

## 2021-08-30 ENCOUNTER — Other Ambulatory Visit: Payer: Self-pay

## 2021-08-30 DIAGNOSIS — G20 Parkinson's disease: Secondary | ICD-10-CM

## 2021-08-30 MED ORDER — CARBIDOPA-LEVODOPA 25-100 MG PO TABS: ORAL_TABLET | ORAL | 0 refills | Status: DC

## 2021-08-30 NOTE — Telephone Encounter (Signed)
Patient called about his refill, he is almost out of the medication.

## 2021-09-02 ENCOUNTER — Ambulatory Visit: Payer: Medicare HMO | Attending: Neurology | Admitting: Occupational Therapy

## 2021-09-02 ENCOUNTER — Ambulatory Visit: Payer: Medicare HMO

## 2021-09-02 DIAGNOSIS — R471 Dysarthria and anarthria: Secondary | ICD-10-CM | POA: Insufficient documentation

## 2021-09-02 DIAGNOSIS — R29818 Other symptoms and signs involving the nervous system: Secondary | ICD-10-CM | POA: Insufficient documentation

## 2021-09-02 NOTE — Therapy (Signed)
[facility] [address] [place], [place], [address] Phone: [phone]   Fax:  [phone]  Patient Details  Name: Dan [name] MRN: [id number] Date of Birth: 01-Feb-1955 Referring Provider:  Ludwig Clarks, DO  Encounter Date: 09/02/2021  Speech Therapy Parkinson's Disease Screen   Decibel Level today: low 70s dB  (WNL=70-72 dB) with sound level meter 30cm away from pt's masked mouth. Pt's conversational volume is WNL at the time of this screening,  Pt does not report difficulty with swallowing warranting further evaluation.  Pt does does not require speech therapy services at this time. Recommend ST screen in approx another 6 months.    [facility] [place], [place] 09/02/2021, 12:07 PM  [facility] [address] [place], [place], [address] Phone: [phone]   Fax:  [phone]

## 2021-09-02 NOTE — Therapy (Signed)
[facility] [address] [place], [place], [address] Phone: [phone]   Fax:  [phone]  Patient Details  Name: BEARETT PORCARO MRN: [id number] Date of Birth: 05/11/1955 Referring Provider:  Ludwig Clarks, DO  Encounter Date: 09/02/2021  Occupational Therapy Parkinson's Disease Screen  Hand dominance:  right   Fastening/unfastening 3 buttons in:  24.79 sec  9-hole peg test:    RUE  21.34 sec        LUE  29.46 sec  Other Comments:  Pt reports no impairments with self-care tasks, no dropping of items, and currently involved in PWR over Parkinson's exercise classes at [facility].  Pt does not require occupational therapy services at this time.  Recommended occupational therapy screen in   6-9 months   [place], [place], [place] 09/02/2021, 11:55 AM  [facility]. [address], [place], [place], [address] Phone: [phone]   Fax:  [phone]

## 2021-09-16 ENCOUNTER — Telehealth: Payer: Self-pay | Admitting: Family Medicine

## 2021-09-16 NOTE — Telephone Encounter (Signed)
Pt called in regarding scheduling his appt. Pt is asking for a return call.

## 2021-09-16 NOTE — Telephone Encounter (Signed)
Copied from [place] [phone]. Topic: Medicare AWV >> Sep 16, 2021 10:55 AM Devoria Glassing wrote: Reason for CRM: Left message for patient to schedule Annual Wellness Visit.  Please schedule with Nurse Health Advisor Charlott Rakes, RN at [facility]. This appt can be telephone or office visit. Please call [phone] ask for [facility]

## 2021-09-17 ENCOUNTER — Ambulatory Visit (INDEPENDENT_AMBULATORY_CARE_PROVIDER_SITE_OTHER): Payer: Medicare HMO

## 2021-09-17 VITALS — BP 138/78 | HR 77 | Temp 98.4°F | Wt 197.4 lb

## 2021-09-17 DIAGNOSIS — Z0000 Encounter for general adult medical examination without abnormal findings: Secondary | ICD-10-CM | POA: Diagnosis not present

## 2021-09-17 NOTE — Patient Instructions (Signed)
Dan [name] , Thank you for taking time to come for your Medicare Wellness Visit. I appreciate your ongoing commitment to your health goals. Please review the following plan we discussed and let me know if I can assist you in the future.   Screening recommendations/referrals: Colonoscopy: done 12/22/17 repeat every 5 years  Recommended yearly ophthalmology/optometry visit for glaucoma screening and checkup Recommended yearly dental visit for hygiene and checkup  Vaccinations: Influenza vaccine: done 10/24/20 repeat every year  Pneumococcal vaccine: Up to date Tdap vaccine: done 11/10/17 repeat every 10 years  Shingles vaccine: completed 5/23, 10/20/16    Covid-19: completed 3/16, 4/12, 10/15/19 & 10/24/20  Advanced directives: Please bring a copy of your health care power of attorney and living will to the office at your convenience.  Conditions/risks identified: maintain health   Next appointment: Follow up in one year for your annual wellness visit.   Preventive Care 27 Years and Older, Male Preventive care refers to lifestyle choices and visits with your health care provider that can promote health and wellness. What does preventive care include? A yearly physical exam. This is also called an annual well check. Dental exams once or twice a year. Routine eye exams. Ask your health care provider how often you should have your eyes checked. Personal lifestyle choices, including: Daily care of your teeth and gums. Regular physical activity. Eating a healthy diet. Avoiding tobacco and drug use. Limiting alcohol use. Practicing safe sex. Taking low doses of aspirin every day. Taking vitamin and mineral supplements as recommended by your health care provider. What happens during an annual well check? The services and screenings done by your health care provider during your annual well check will depend on your age, overall health, lifestyle risk factors, and family history of  disease. Counseling  Your health care provider may ask you questions about your: Alcohol use. Tobacco use. Drug use. Emotional well-being. Home and relationship well-being. Sexual activity. Eating habits. History of falls. Memory and ability to understand (cognition). Work and work Statistician. Screening  You may have the following tests or measurements: Height, weight, and BMI. Blood pressure. Lipid and cholesterol levels. These may be checked every 5 years, or more frequently if you are over 50 years old. Skin check. Lung cancer screening. You may have this screening every year starting at age 5 if you have a 30-pack-year history of smoking and currently smoke or have quit within the past 15 years. Fecal occult blood test (FOBT) of the stool. You may have this test every year starting at age 36. Flexible sigmoidoscopy or colonoscopy. You may have a sigmoidoscopy every 5 years or a colonoscopy every 10 years starting at age 52. Prostate cancer screening. Recommendations will vary depending on your family history and other risks. Hepatitis C blood test. Hepatitis B blood test. Sexually transmitted disease (STD) testing. Diabetes screening. This is done by checking your blood sugar (glucose) after you have not eaten for a while (fasting). You may have this done every 1-3 years. Abdominal aortic aneurysm (AAA) screening. You may need this if you are a current or former smoker. Osteoporosis. You may be screened starting at age 25 if you are at high risk. Talk with your health care provider about your test results, treatment options, and if necessary, the need for more tests. Vaccines  Your health care provider may recommend certain vaccines, such as: Influenza vaccine. This is recommended every year. Tetanus, diphtheria, and acellular pertussis (Tdap, Td) vaccine. You may need a Td  booster every 10 years. Zoster vaccine. You may need this after age 39. Pneumococcal 13-valent  conjugate (PCV13) vaccine. One dose is recommended after age 60. Pneumococcal polysaccharide (PPSV23) vaccine. One dose is recommended after age 45. Talk to your health care provider about which screenings and vaccines you need and how often you need them. This information is not intended to replace advice given to you by your health care provider. Make sure you discuss any questions you have with your health care provider. Document Released: 01/16/2015 Document Revised: 09/09/2015 Document Reviewed: 10/21/2014 Elsevier Interactive Patient Education  2017 [place] Prevention in the Home Falls can cause injuries. They can happen to people of all ages. There are many things you can do to make your home safe and to help prevent falls. What can I do on the outside of my home? Regularly fix the edges of walkways and driveways and fix any cracks. Remove anything that might make you trip as you walk through a door, such as a raised step or threshold. Trim any bushes or trees on the path to your home. Use bright outdoor lighting. Clear any walking paths of anything that might make someone trip, such as rocks or tools. Regularly check to see if handrails are loose or broken. Make sure that both sides of any steps have handrails. Any raised decks and porches should have guardrails on the edges. Have any leaves, snow, or ice cleared regularly. Use sand or salt on walking paths during winter. Clean up any spills in your garage right away. This includes oil or grease spills. What can I do in the bathroom? Use night lights. Install grab bars by the toilet and in the tub and shower. Do not use towel bars as grab bars. Use non-skid mats or decals in the tub or shower. If you need to sit down in the shower, use a plastic, non-slip stool. Keep the floor dry. Clean up any water that spills on the floor as soon as it happens. Remove soap buildup in the tub or shower regularly. Attach bath mats  securely with double-sided non-slip rug tape. Do not have throw rugs and other things on the floor that can make you trip. What can I do in the bedroom? Use night lights. Make sure that you have a light by your bed that is easy to reach. Do not use any sheets or blankets that are too big for your bed. They should not hang down onto the floor. Have a firm chair that has side arms. You can use this for support while you get dressed. Do not have throw rugs and other things on the floor that can make you trip. What can I do in the kitchen? Clean up any spills right away. Avoid walking on wet floors. Keep items that you use a lot in easy-to-reach places. If you need to reach something above you, use a strong step stool that has a grab bar. Keep electrical cords out of the way. Do not use floor polish or wax that makes floors slippery. If you must use wax, use non-skid floor wax. Do not have throw rugs and other things on the floor that can make you trip. What can I do with my stairs? Do not leave any items on the stairs. Make sure that there are handrails on both sides of the stairs and use them. Fix handrails that are broken or loose. Make sure that handrails are as long as the stairways. Check any carpeting  to make sure that it is firmly attached to the stairs. Fix any carpet that is loose or worn. Avoid having throw rugs at the top or bottom of the stairs. If you do have throw rugs, attach them to the floor with carpet tape. Make sure that you have a light switch at the top of the stairs and the bottom of the stairs. If you do not have them, ask someone to add them for you. What else can I do to help prevent falls? Wear shoes that: Do not have high heels. Have rubber bottoms. Are comfortable and fit you well. Are closed at the toe. Do not wear sandals. If you use a stepladder: Make sure that it is fully opened. Do not climb a closed stepladder. Make sure that both sides of the stepladder  are locked into place. Ask someone to hold it for you, if possible. Clearly mark and make sure that you can see: Any grab bars or handrails. First and last steps. Where the edge of each step is. Use tools that help you move around (mobility aids) if they are needed. These include: Canes. Walkers. Scooters. Crutches. Turn on the lights when you go into a dark area. Replace any light bulbs as soon as they burn out. Set up your furniture so you have a clear path. Avoid moving your furniture around. If any of your floors are uneven, fix them. If there are any pets around you, be aware of where they are. Review your medicines with your doctor. Some medicines can make you feel dizzy. This can increase your chance of falling. Ask your doctor what other things that you can do to help prevent falls. This information is not intended to replace advice given to you by your health care provider. Make sure you discuss any questions you have with your health care provider. Document Released: 10/16/2008 Document Revised: 05/28/2015 Document Reviewed: 01/24/2014 Elsevier Interactive Patient Education  2017 [employer].

## 2021-09-17 NOTE — Progress Notes (Signed)
Subjective:   Dan [name] is a 66 y.o. male who presents for an Initial Medicare Annual Wellness Visit.  Review of Systems     Cardiac Risk Factors include: advanced age (>72mn, >>42women);dyslipidemia;male gender     Objective:    Today's Vitals   09/17/21 0841  BP: 138/78  Pulse: 77  Temp: 98.4 F (36.9 C)  SpO2: 97%  Weight: 197 lb 6.4 oz (89.5 kg)   Body mass index is 24.67 kg/m.     09/17/2021    8:55 AM 06/28/2021    8:10 AM 04/22/2021    8:51 AM 03/01/2021    8:05 AM 12/22/2020    9:14 AM 08/25/2020    8:13 AM 04/23/2020    8:04 AM  Advanced Directives  Does Patient Have a Medical Advance Directive? Yes Yes Yes Yes Yes Yes No  Type of AParamedicof ALivingstonLiving will HBear DanceLiving will;Out of facility DNR (pink MOST or yellow form) HPageLiving will Living will  HMarionLiving will;Out of facility DNR (pink MOST or yellow form)   Does patient want to make changes to medical advance directive?   No - Patient declined      Copy of H[place] Chart? No - copy requested          Current Medications (verified) Outpatient Encounter Medications as of 09/17/2021  Medication Sig   Alpha-Lipoic Acid 200 MG TABS Take by mouth daily.    Ascorbic Acid (VITAMIN C) 1000 MG tablet Take 1,000 mg by mouth daily. With rose hips   B Complex Vitamins (VITAMIN B COMPLEX PO) Take by mouth daily.    carbidopa-levodopa (SINEMET IR) 25-100 MG tablet Take 2 tabs at 6 am /Take 2 at 8 am/Take 2 at 10 am / Take 2 @ 12pm  /Take 2 at 2pm / Take 2 at 4pm /Take 2 at 6 pm   cholecalciferol (VITAMIN D) 1000 units tablet Take 1,000 Units by mouth daily. Vit d 3- Take 1400mcg daily   FOLIC ACID PO Take 4867mg by mouth.    glucosamine-chondroitin 500-400 MG tablet Take 1 tablet by mouth daily.    L-Lysine 500 MG TABS Take 1,000 mg by mouth daily.    Multiple Vitamin (MULTIVITAMIN)  tablet Take 1 tablet by mouth daily.   Omega-3 Fatty Acids (FISH OIL) 1000 MG CAPS Take 1,000 mg by mouth daily.   saw palmetto 160 MG capsule Take 450 mg by mouth daily.    Turmeric (QC TUMERIC COMPLEX PO) Take by mouth.   vitamin B-12 (CYANOCOBALAMIN) 1000 MCG tablet Take 1,000 mcg by mouth daily.   [DISCONTINUED] traZODone (DESYREL) 50 MG tablet Take 1 tablet (50 mg total) by mouth at bedtime.   No facility-administered encounter medications on file as of 09/17/2021.    Allergies (verified) Amoxicillin   History: Past Medical History:  Diagnosis Date   Allergy    seasonal   Anxiety    Bleeding hemorrhoid 05/24/2010   Depression 08/11/2006   Anxiety [place] well around [place]    Hyperlipidemia LDL goal < 130 05/24/2010   Irritable bowel syndrome 08/29/2007   Improved with diet change     Parkinson disease ([place]    since 2016   Past Surgical History:  Procedure Laterality Date   APPENDECTOMY     COLONOSCOPY  05-01-00   HERNIA REPAIR  2014  Bil inguinal hernia repair   ROOT CANAL     TONSILLECTOMY  1958   Family History  Problem Relation Age of Onset   Hypertension Mother    Breast cancer Mother    Ovarian cancer Mother        in her 63s   Prostate cancer Father        complications from radioactive seeds, colostomy for last 10 years   Social History   Socioeconomic History   Marital status: Single    Spouse name: Not on file   Number of children: 0   Years of education: Not on file   Highest education level: Bachelor's degree (e.g., BA, AB, BS)  Occupational History   Occupation: Public relations account executive    Comment: works at [employer]  Tobacco Use   Smoking status: Never   Smokeless tobacco: Never  Vaping Use   Vaping Use: Never used  Substance and Sexual Activity   Alcohol use: Yes    Alcohol/week: 0.0 standard drinks of alcohol    Comment: social   Drug use: No   Sexual activity: Not on file  Other Topics  Concern   Not on file  Social History Narrative   Lives alone. Long term partner - does not live with him. Home is two levels      Planning September 2023 to wind things down at [employer]    Works at the [employer]    Formerly Designer, multimedia.    Goes to [employer]      Hobbies: Exercises regularly, relax with friends   Right handed   Caffeine 1 cup daily   Social Determinants of Health   Financial Resource Strain: Low Risk  (09/17/2021)   Overall Financial Resource Strain (CARDIA)    Difficulty of Paying Living Expenses: Not hard at all  Food Insecurity: No Food Insecurity (09/17/2021)   Hunger Vital Sign    Worried About Running Out of Food in the Last Year: Never true    Ran Out of Food in the Last Year: Never true  Transportation Needs: No Transportation Needs (09/17/2021)   PRAPARE - Hydrologist (Medical): No    Lack of Transportation (Non-Medical): No  Physical Activity: Insufficiently Active (09/17/2021)   Exercise Vital Sign    Days of Exercise per Week: 2 days    Minutes of Exercise per Session: 50 min  Stress: No Stress Concern Present (09/17/2021)   [place]    Feeling of Stress : Not at all  Social Connections: Moderately Integrated (09/17/2021)   Social Connection and Isolation Panel [NHANES]    Frequency of Communication with Friends and Family: More than three times a week    Frequency of Social Gatherings with Friends and Family: More than three times a week    Attends Religious Services: More than 4 times per year    Active Member of [employer] or Organizations: Yes    Attends Archivist Meetings: 1 to 4 times per year    Marital Status: Never married    Tobacco Counseling Counseling given: Not Answered   Clinical Intake:  Pre-visit preparation completed: Yes  Pain : No/denies pain      BMI - recorded: 24.67 Nutritional Status: BMI of 19-24  Normal Nutritional Risks: None Diabetes: No  How often do you need to have someone help you when you read instructions, pamphlets, or other written  materials from your doctor or pharmacy?: 1 - Never  Diabetic?no  Interpreter Needed?: No  Information entered by :: Charlott Rakes, LPN   Activities of Daily Living    09/17/2021    8:58 AM  In your present state of health, do you have any difficulty performing the following activities:  Hearing? 1  Comment tinnitus left ear  Vision? 0  Difficulty concentrating or making decisions? 0  Walking or climbing stairs? 0  Dressing or bathing? 0  Doing errands, shopping? 0  Preparing Food and eating ? N  Using the Toilet? N  In the past six months, have you accidently leaked urine? N  Do you have problems with loss of bowel control? N  Managing your Medications? N  Managing your Finances? N  Housekeeping or managing your Housekeeping? N    Patient Care Team: Marin Olp, MD as PCP - General (Family Medicine) Tat, Eustace Quail, DO as Consulting Physician (Neurology) Canary Brim as Consulting Physician (Dentistry) Dr. Suszanne Finch as Consulting Physician (Chiropractic Medicine)  Indicate any recent Medical Services you may have received from other than [facility] providers in the past year (date may be approximate).     Assessment:   This is a routine wellness examination for Jilberto.  Hearing/Vision screen Hearing Screening - Comments:: Pt stated left ear tinnitus  Vision Screening - Comments:: Pt follows up with [facility] provider [place] [facility] for annual eye exams   Dietary issues and exercise activities discussed: Current Exercise Habits: Home exercise routine, Type of exercise: Other - see comments;yoga;walking, Time (Minutes): 45, Frequency (Times/Week): 2, Weekly Exercise (Minutes/Week): 90   Goals Addressed             This Visit's Progress    Patient Stated        Maintain healthy weight        Depression Screen    09/17/2021    8:53 AM 06/03/2021   10:18 AM 12/05/2019    8:20 AM 06/07/2019    8:04 AM 05/09/2017    8:54 AM  PHQ 2/9 Scores  PHQ - 2 Score 0 0 0 0 0  PHQ- 9 Score  4       Fall Risk    09/17/2021    8:58 AM 06/28/2021    8:10 AM 03/01/2021    8:05 AM 12/22/2020    9:14 AM 08/25/2020    8:13 AM  Fall Risk   Falls in the past year? 0 0 0 0 0  Number falls in past yr: 0 0 0 0 0  Injury with Fall? 0 0 0 0 0  Risk for fall due to : No Fall Risks;Impaired vision      Follow up Falls prevention discussed        FALL RISK PREVENTION PERTAINING TO THE HOME:  Any stairs in or around the home? Yes  If so, are there any without handrails? No  Home free of loose throw rugs in walkways, pet beds, electrical cords, etc? Yes  Adequate lighting in your home to reduce risk of falls? Yes   ASSISTIVE DEVICES UTILIZED TO PREVENT FALLS:  Life alert? No  Use of a cane, walker or w/c? Yes  Grab bars in the bathroom? No  Shower chair or bench in shower? No  Elevated toilet seat or a handicapped toilet? No   TIMED UP AND GO:  Was the test performed? Yes .  Length of time to ambulate 10 feet: 10 sec.   Gait steady  and fast without use of assistive device  Cognitive Function:        09/17/2021    8:59 AM  6CIT Screen  What Year? 0 points  What month? 0 points  What time? 0 points  Count back from 20 0 points  Months in reverse 0 points  Repeat phrase 0 points  Total Score 0 points    Immunizations Immunization History  Administered Date(s) Administered   Influenza Whole 01/03/2002   Influenza, High Dose Seasonal PF 10/24/2020   Influenza,inj,Quad PF,6+ Mos 08/19/2017, 09/04/2018   Influenza-Unspecified 09/21/2014, 10/08/2015, 08/31/2016, 10/04/2019   PFIZER(Purple Top)SARS-COV-2 Vaccination 03/19/2019, 04/15/2019, 10/15/2019   PNEUMOCOCCAL CONJUGATE-20 06/03/2021   Pfizer Covid-19 Vaccine Bivalent Booster 17yr & up  10/24/2020   Td 10/03/1996, 11/05/2007   Tdap 11/10/2017   Zoster Recombinat (Shingrix) 05/25/2016, 10/20/2016   Zoster, Live 11/18/2015    TDAP status: Up to date  Flu Vaccine status: Up to date  Pneumococcal vaccine status: Up to date  Covid-19 vaccine status: Completed vaccines  Qualifies for Shingles Vaccine? Yes   Zostavax completed Yes   Shingrix Completed?: Yes  Screening Tests Health Maintenance  Topic Date Due   COVID-19 Vaccine (5 - Pfizer series) 02/24/2021   INFLUENZA VACCINE  08/03/2021   COLONOSCOPY (Pts 45-418yrInsurance coverage will need to be confirmed)  12/23/2022   TETANUS/TDAP  11/11/2027   Pneumonia Vaccine 6548Years old  Completed   Hepatitis C Screening  Completed   Zoster Vaccines- Shingrix  Completed   HPV VACCINES  Aged Out    Health Maintenance  Health Maintenance Due  Topic Date Due   COVID-19 Vaccine (5 - Pfizer series) 02/24/2021   INFLUENZA VACCINE  08/03/2021    Colorectal cancer screening: Type of screening: Colonoscopy. Completed 12/22/17. Repeat every 5 years   Additional Screening:  Hepatitis C Screening:  Completed 10/30/14  Vision Screening: Recommended annual ophthalmology exams for early detection of glaucoma and other disorders of the eye. Is the patient up to date with their annual eye exam?  Yes  Who is the provider or what is the name of the office in which the patient attends annual eye exams? [place] [facility]  If pt is not established with a provider, would they like to be referred to a provider to establish care? No .   Dental Screening: Recommended annual dental exams for proper oral hygiene  Community Resource Referral / Chronic Care Management: CRR required this visit?  No   CCM required this visit?  No      Plan:     I have personally reviewed and noted the following in the patient's chart:   Medical and social history Use of alcohol, tobacco or illicit drugs  Current medications and  supplements including opioid prescriptions. Patient is not currently taking opioid prescriptions. Functional ability and status Nutritional status Physical activity Advanced directives List of other physicians Hospitalizations, surgeries, and ER visits in previous 12 months Vitals Screenings to include cognitive, depression, and falls Referrals and appointments  In addition, I have reviewed and discussed with patient certain preventive protocols, quality metrics, and best practice recommendations. A written personalized care plan for preventive services as well as general preventive health recommendations were provided to patient.     TiWillette BraceLPN   09/10/14/9678 Nurse Notes: none

## 2021-09-27 ENCOUNTER — Encounter: Payer: Self-pay | Admitting: *Deleted

## 2021-11-03 ENCOUNTER — Telehealth: Payer: Self-pay | Admitting: Physical Therapy

## 2021-11-03 ENCOUNTER — Ambulatory Visit: Payer: Medicare HMO | Admitting: Neurology

## 2021-11-03 DIAGNOSIS — G20A1 Parkinson's disease without dyskinesia, without mention of fluctuations: Secondary | ICD-10-CM

## 2021-11-03 NOTE — Telephone Encounter (Signed)
Dr. Carles Collet,  Debroah Loop is scheduled for  PT return evaluation in November 2023 as recommended when he was last discharged from therapy due to progressive nature of diagnosis.  Pt was in agreement with this plan.  If you are in agreement, please send updated order for PT via epic.  Thank you,  Mady Haagensen, PT 11/03/21 3:37 PM Phone: [phone] Fax: [phone]  [facility] [address], [place], [place] [address] Phone # [phone] Fax # [phone]

## 2021-11-09 ENCOUNTER — Telehealth: Payer: Self-pay | Admitting: Anesthesiology

## 2021-11-09 ENCOUNTER — Other Ambulatory Visit: Payer: Self-pay

## 2021-11-09 DIAGNOSIS — G20A1 Parkinson's disease without dyskinesia, without mention of fluctuations: Secondary | ICD-10-CM

## 2021-11-09 NOTE — Telephone Encounter (Signed)
Called patient left message

## 2021-11-09 NOTE — Telephone Encounter (Signed)
Patient requests a call back. On his Carvidopa-Levodopa prescription he has enough for two more days and his insurance will not cover his next refill until 11/13/21.   1. Which medications need refilled? Carvidopa-Levodopa 25-100 mg  2. Which pharmacy/location is medication to be sent to? [facility] [address] [employer] [employer]  3. Do they need a 30 day or 90 day supply? 90 day supply

## 2021-11-09 NOTE — Telephone Encounter (Signed)
Patient return call. ?

## 2021-11-10 MED ORDER — CARBIDOPA-LEVODOPA 25-100 MG PO TABS: ORAL_TABLET | ORAL | 0 refills | Status: DC

## 2021-11-10 NOTE — Telephone Encounter (Signed)
Spoke to patient and he said he is taking the additional 4 as a PRN for when he needs extra. Patient reported no off periods at this time he is feeling great. He is attending [employer] doing [facility]  and PT. Patient said he was called and asked when he was taking his medication at the last call he had to the office to adjust his medication and disscuss the side effects he was having with a trial medication Dr. Carles Collet had started him on and did not work for him  These are the times he is taking it. He is not going to be out he said he has enough to make it to the 11th.

## 2021-11-11 ENCOUNTER — Other Ambulatory Visit: Payer: Self-pay

## 2021-11-11 ENCOUNTER — Ambulatory Visit: Payer: Medicare HMO | Attending: Neurology | Admitting: Physical Therapy

## 2021-11-11 ENCOUNTER — Ambulatory Visit: Payer: Medicare HMO | Admitting: Physical Therapy

## 2021-11-11 DIAGNOSIS — G20A1 Parkinson's disease without dyskinesia, without mention of fluctuations: Secondary | ICD-10-CM | POA: Diagnosis not present

## 2021-11-11 DIAGNOSIS — R2681 Unsteadiness on feet: Secondary | ICD-10-CM | POA: Diagnosis not present

## 2021-11-11 DIAGNOSIS — R2689 Other abnormalities of gait and mobility: Secondary | ICD-10-CM | POA: Insufficient documentation

## 2021-11-11 NOTE — Therapy (Signed)
OUTPATIENT PHYSICAL THERAPY NEURO EVALUATION   Patient Name: Dan [name] MRN: [id number] DOB:1955-12-23, 66 y.o., male Today's Date: 11/12/2021   PCP: [name], MD REFERRING PROVIDER: Alonza Bogus, DO   PT End of Session - 11/11/21 1402     Visit Number 1    Number of Visits 1    Authorization Type [employer] completed at eval    PT Start Time 1403    PT Stop Time 1440    PT Time Calculation (min) 37 min    Activity Tolerance Patient tolerated treatment well    Behavior During Therapy [facility] for tasks assessed/performed             Past Medical History:  Diagnosis Date   Allergy    seasonal   Anxiety    Bleeding hemorrhoid 05/24/2010   Depression 08/11/2006   Anxiety as well around down market- tough time with job loss in [place]     Hyperlipidemia LDL goal < 130 05/24/2010   Irritable bowel syndrome 08/29/2007   Improved with diet change     Parkinson disease ([place])    since 2016   Past Surgical History:  Procedure Laterality Date   APPENDECTOMY     COLONOSCOPY  05-01-00   HERNIA REPAIR  2014   Bil inguinal hernia repair   [place]   Patient Active Problem List   Diagnosis Date Noted   History of colon polyps 01/08/2018   Hypotestosteronemia 11/05/2015   Low serum vitamin D 04/16/2015   Elevated PSA 10/30/2014   Parkinson's disease 10/30/2014   Hyperlipidemia 05/24/2010   Bleeding hemorrhoid 05/24/2010   Irritable bowel syndrome 08/29/2007   Anxiety state 08/11/2006    ONSET DATE: 11/04/2021-MD referrral  REFERRING DIAG: G20.A1 (ICD-10-CM) - Parkinson's disease without dyskinesia or fluctuating manifestations   THERAPY DIAG:  Other abnormalities of gait and mobility  Unsteadiness on feet  Rationale for Evaluation and Treatment: Rehabilitation  SUBJECTIVE:                                                                                                                                                                                              SUBJECTIVE STATEMENT: Been staying active with exercises at classes at [facility].  Do classes almost every day. Pt accompanied by: self  PERTINENT HISTORY: See above  PAIN:  Are you having pain? No  PRECAUTIONS: Fall  WEIGHT BEARING RESTRICTIONS: No  FALLS: Has patient fallen in last 6 months? No  LIVING ENVIRONMENT: Lives with: lives with their family and lives alone Lives in: House/apartment Stairs: Yes: Internal: 5  steps;   Has following equipment at home: None  PLOF: Independent and Leisure: exercise classes at [employer]; recently retired  PATIENT GOALS: Not sure that I need physical therapy right now.  Feel very good.  OBJECTIVE:   DIAGNOSTIC FINDINGS: NA  COGNITION: Overall cognitive status: Within functional limits for tasks assessed   POSTURE: No Significant postural limitations Tremors noted LUE  LOWER EXTREMITY ROM:   WFL  Active  Right Eval Left Eval  Hip flexion    Hip extension    Hip abduction    Hip adduction    Hip internal rotation    Hip external rotation    Knee flexion    Knee extension    Ankle dorsiflexion    Ankle plantarflexion    Ankle inversion    Ankle eversion     (Blank rows = not tested)  LOWER EXTREMITY MMT:  grossly tested 5/5 throughout  MMT Right Eval Left Eval  Hip flexion    Hip extension    Hip abduction    Hip adduction    Hip internal rotation    Hip external rotation    Knee flexion    Knee extension    Ankle dorsiflexion    Ankle plantarflexion    Ankle inversion    Ankle eversion    (Blank rows = not tested)    TRANSFERS: Assistive device utilized: None  Sit to stand: Complete Independence Stand to sit: Complete Independence   GAIT: Gait pattern: step through pattern Distance walked: 50 ft x 2 Assistive device utilized: None Level of assistance: Complete Independence Comments: Mild dyskinesias LUE  FUNCTIONAL TESTS:  5 times sit to stand:  7.72 sec Timed up and go (TUG): 8.66 sec TUG cognitive:  11.47 sec MiniBESTest:  25/28 Gait velocity:  6.31 sec (5.2 ft/sec)     PATIENT EDUCATION: Education details: PT eval results, POC for eval only, to continue his current HEP and exercise routine, return screens in 6-9 months Person educated: Patient Education method: Explanation Education comprehension: verbalized understanding  HOME EXERCISE PROGRAM: NA  ASSESSMENT:  CLINICAL IMPRESSION: Patient is a 66 y.o. male who was seen today for return physical therapy evaluation and treatment for Parkinson's disease. He is doing daily exercise classes per his report and overall feels very good.  With functional measures assessed today, transfers, balance, and gait are all within normal limits and do not appear significantly changed since finishing bout of PT approx 6 months ago.  Pt does not appear to have any skilled PT needs at this time.  Encouraged him to continue his current exercise routine, and to keep up with HEP from previous bout of therapy.  OBJECTIVE IMPAIRMENTS: Abnormal gait.     CLINICAL DECISION MAKING: Stable/uncomplicated  EVALUATION COMPLEXITY: Low  PLAN:  PT FREQUENCY:  eval only  PT DURATION: other: eval only  PLANNED INTERVENTIONS:  Eval only  PLAN FOR NEXT SESSION: plan for return screen in 6-9 months (OT and speech screens are scheduled in April, no PT available, so PT screen in July)   Annabell Oconnor W., PT 11/12/2021, 1:04 PM  [facility] [address], [place], [place] [address] Phone # [phone] Fax # [phone]

## 2021-12-01 ENCOUNTER — Telehealth: Payer: Self-pay | Admitting: Neurology

## 2021-12-01 NOTE — Telephone Encounter (Signed)
Called patient and he understood and will let us know if it happens again

## 2021-12-01 NOTE — Telephone Encounter (Signed)
Pt called in stating he took 2 carbidopa-levodopa at 8 am and 2 at 10 am and they have not kicked in. He said this is the first time this has happened.

## 2021-12-01 NOTE — Telephone Encounter (Signed)
Called patient and he stated this has never happened before. He was talking to other PD patients at [facility] and they had mentioned the 50/200 CR that they take at night and patient was wondering if that was something that could be added since he no longer takes the 6AM dose and does not take his carbidopa levodopa until 8AM. His medication has kicked in now after two doses

## 2021-12-03 ENCOUNTER — Encounter: Payer: Self-pay | Admitting: Family Medicine

## 2021-12-03 ENCOUNTER — Ambulatory Visit (INDEPENDENT_AMBULATORY_CARE_PROVIDER_SITE_OTHER): Payer: Medicare HMO | Admitting: Family Medicine

## 2021-12-03 VITALS — BP 134/88 | HR 70 | Temp 98.3°F | Ht 75.0 in | Wt 196.0 lb

## 2021-12-03 DIAGNOSIS — G20A1 Parkinson's disease without dyskinesia, without mention of fluctuations: Secondary | ICD-10-CM | POA: Diagnosis not present

## 2021-12-03 DIAGNOSIS — R7989 Other specified abnormal findings of blood chemistry: Secondary | ICD-10-CM

## 2021-12-03 DIAGNOSIS — E785 Hyperlipidemia, unspecified: Secondary | ICD-10-CM

## 2021-12-03 NOTE — Progress Notes (Signed)
Phone [phone] In person visit   Subjective:   Dan [name] is a 66 y.o. year old very pleasant male patient who presents for/with See problem oriented charting Chief Complaint  Patient presents with   Follow-up    Pt has no questions or concerns     Past Medical History-  Patient Active Problem List   Diagnosis Date Noted   Hypotestosteronemia 11/05/2015    Priority: High   Elevated PSA 10/30/2014    Priority: High   Parkinson's disease 10/30/2014    Priority: High   Low serum vitamin D 04/16/2015    Priority: Medium    Hyperlipidemia 05/24/2010    Priority: Medium    Anxiety state 08/11/2006    Priority: Medium    History of colon polyps 01/08/2018    Priority: Low   Bleeding hemorrhoid 05/24/2010    Priority: Low   Irritable bowel syndrome 08/29/2007    Priority: Low    Medications- reviewed and updated Current Outpatient Medications  Medication Sig Dispense Refill   Alpha-Lipoic Acid 200 MG TABS Take by mouth daily.      Ascorbic Acid (VITAMIN C) 1000 MG tablet Take 1,000 mg by mouth daily. With rose hips     B Complex Vitamins (VITAMIN B COMPLEX PO) Take by mouth daily.      carbidopa-levodopa (SINEMET IR) 25-100 MG tablet Take 2 pills at 6am/8:30/10:30/1pm/4pmMay take 1 PRN daily if needed 990 tablet 0   cholecalciferol (VITAMIN D) 1000 units tablet Take 1,000 Units by mouth daily. Vit d 3- Take 125 mcg daily     FOLIC ACID PO Take 852 mg by mouth.      glucosamine-chondroitin 500-400 MG tablet Take 1 tablet by mouth daily.      L-Lysine 500 MG TABS Take 1,000 mg by mouth daily.      Multiple Vitamin (MULTIVITAMIN) tablet Take 1 tablet by mouth daily.     Omega-3 Fatty Acids (FISH OIL) 1000 MG CAPS Take 1,000 mg by mouth daily.     saw palmetto 160 MG capsule Take 450 mg by mouth daily.      Turmeric (QC TUMERIC COMPLEX PO) Take by mouth.     vitamin B-12 (CYANOCOBALAMIN) 1000 MCG tablet Take 1,000 mcg by mouth daily.     No current  facility-administered medications for this visit.     Objective:  BP 134/88 (BP Location: Right Arm, Patient Position: Sitting)   Pulse 70   Temp 98.3 F (36.8 C) (Temporal)   Ht '6\' 3"'$  (1.905 m)   Wt 196 lb (88.9 kg)   SpO2 96%   BMI 24.50 kg/m  Gen: NAD, resting comfortably CV: RRR no murmurs rubs or gallops Lungs: CTAB no crackles, wheeze, rhonchi Abdomen: soft/nontender/nondistended/normal bowel sounds. No rebound or guarding.  Ext: no edema Skin: warm, dry Neuro: tremor noted, does not use assistive device    Assessment and Plan   # Social update-retired from [employer].  -enjoying his gym AT [employer]- parkinsons exercise classes, yoga, balance classes- going 4-5 days a week - caring for mom at friends home - stable at 87  % Parkinson's-follows with Dr. Carles Collet S: Medication: Sinemet instant release 2 tablets at 6 AM, 8:30 AM AM, 10:30 AM/ 1 pm, 4 PM- only gets about 2 hours of benefit and taking longer for it to kick in- not able to do much when not kicked in A/P: ongoing issues- some struggles with medicine   as above- continue current meds and neurology follow up   #  Insomnia S: Reported this at last visit and we discussed some options to try to help particularly stopping TV before bedtime for at least 30 minutes- has not worked well for him.  Also discussed potential prior behavioral therapy -has trazodone available if needed through Dr. Carles Collet A/P: reasonable control on trazdone- resistant to changing prebedtime tv habits     #Vitamin D deficiency S: Medication: 1000 units at least Last vitamin D Lab Results  Component Value Date   VD25OH 49.03 06/03/2021  A/P: Controlled. Continue current medications.    #hyperlipidemia S: Medication: None The 10-year ASCVD risk score (Arnett DK, et al., 2019) is: 13.9%  Lab Results  Component Value Date   CHOL 175 06/03/2021   HDL 47.50 06/03/2021   LDLCALC 102 (H) 06/03/2021   TRIG 130.0 06/03/2021   CHOLHDL 4 06/03/2021    A/P: mild poor control- wants to hold off on ct calcium scoring at least until CT repeat- feels stress is better and exercise is better and hoping he will see improvements.   # Elevated PSA-follows with [facility]. Rosana Hoes at [facility] was 7.08 in July down slightly from 7.41 on prior check Lab Results  Component Value Date   PSA 4.91 (H) 11/10/2017   PSA 4.64 (H) 05/09/2017   PSA 5.62 01/13/2017   Recommended follow up: Schedule a lab visit at the check out desk for June 04 2022 or later.  Return for future fasting labs meaning nothing but water after midnight please. Ok to take your medications with water.  - schedule visit a few days after that with me for physical Future Appointments  Date Time Provider [place]  12/08/2021 11:15 AM Tat, Eustace Quail, DO [facility] None  04/14/2022 11:45 AM Sharen Counter, CCC-SLP [facility]  04/14/2022 12:00 PM Kerrie Buffalo, OT [facility]  07/14/2022 12:00 PM Frazier Butt, PT [facility]  09/23/2022  8:45 AM [facility] HEALTH COACH [facility]    Lab/Order associations:   ICD-10-CM   1. Parkinson's disease, unspecified whether dyskinesia present, unspecified whether manifestations fluctuate  G20.A1     2. Hyperlipidemia, unspecified hyperlipidemia type  E78.5 CBC with Differential/Platelet    Comprehensive metabolic panel    Lipid panel    3. Low serum vitamin D  R79.89 VITAMIN D 25 Hydroxy (Vit-D Deficiency, Fractures)     Time Spent: 23 minutes of total time (11:00 AM- 11:23 AM) was spent on the date of the encounter performing the following actions: chart review prior to seeing the patient, obtaining history, counseling on continuing current treatment plan and discussing parkinson barriers, and documenting in our EHR.   Return precautions advised.  [name], MD

## 2021-12-03 NOTE — Patient Instructions (Addendum)
Glad you are doing so well!   Schedule a lab visit at the check out desk for June 04 2022 or later.  Return for future fasting labs meaning nothing but water after midnight please. Ok to take your medications with water.  - schedule visit a few days after that with me for physical

## 2021-12-07 NOTE — Progress Notes (Unsigned)
Assessment/Plan:   1.  Parkinsons Disease  -carbidopa/levodopa 25/100, 2 tablets 5  times per day.  -looks well treated with the exception of tremor.    -handicap placard filled out   2.  Sciatica, intermittent  -Doing well from the standpoint right now.  "The back has gotten better and I think it is the Parkinsons Disease exercise class"  3.  Insomnia  -*** trazodone 50 mg q hs.     Subjective:   Dan [name] was seen today in follow up for Parkinsons disease.  My previous records were reviewed prior to todays visit as well as outside records available to me. No falls.  Has retired since last visit and doing well.  Exercising with [employer].  Called recently with one episode of dose failure ever.  Started trazodone last visit but ***.  Current prescribed movement disorder medications: Carbidopa/levodopa 25/100,  2 tablets 5  times per day (6am/8:30/10:30/1pm/4pm)  Trazodone (started last visit)  PREVIOUS MEDICATIONS:  pramipexole (patient thought worsen depression and cause headache/nausea); rotigotine (given it but didn't take it - worried about it due to cost/upcoming MCR); entacapone (patient stopped after thought not helping); ropinirole (patient felt had side effects at very low dosages and stopped)  ALLERGIES:   Allergies  Allergen Reactions   Amoxicillin Other (See Comments)    Severe fatigue    CURRENT MEDICATIONS:  Outpatient Encounter Medications as of 12/08/2021  Medication Sig   Alpha-Lipoic Acid 200 MG TABS Take by mouth daily.    Ascorbic Acid (VITAMIN C) 1000 MG tablet Take 1,000 mg by mouth daily. With rose hips   B Complex Vitamins (VITAMIN B COMPLEX PO) Take by mouth daily.    carbidopa-levodopa (SINEMET IR) 25-100 MG tablet Take 2 pills at 6am/8:30/10:30/1pm/4pmMay take 1 PRN daily if needed   cholecalciferol (VITAMIN D) 1000 units tablet Take 1,000 Units by mouth daily. Vit d 3- Take 466 mcg daily   FOLIC ACID PO Take 599 mg by mouth.     glucosamine-chondroitin 500-400 MG tablet Take 1 tablet by mouth daily.    L-Lysine 500 MG TABS Take 1,000 mg by mouth daily.    Multiple Vitamin (MULTIVITAMIN) tablet Take 1 tablet by mouth daily.   Omega-3 Fatty Acids (FISH OIL) 1000 MG CAPS Take 1,000 mg by mouth daily.   saw palmetto 160 MG capsule Take 450 mg by mouth daily.    Turmeric (QC TUMERIC COMPLEX PO) Take by mouth.   vitamin B-12 (CYANOCOBALAMIN) 1000 MCG tablet Take 1,000 mcg by mouth daily.   No facility-administered encounter medications on file as of 12/08/2021.    Objective:   PHYSICAL EXAMINATION:    VITALS:   There were no vitals filed for this visit.      GEN:  The patient appears stated age and is in NAD. HEENT:  Normocephalic, atraumatic.  The mucous membranes are moist.   Neurological examination:  Orientation: The patient is alert and oriented x3. Cranial nerves: There is good facial symmetry without facial hypomimia. The speech is fluent and clear. Soft palate rises symmetrically and there is no tongue deviation. Hearing is intact to conversational tone. Sensation: Sensation is intact to light touch throughout Motor: Strength is at least antigravity x4.  Movement examination: Tone: There is nl tone in the UE/LE Abnormal movements: he has rare LUE rest tremor; rare L leg dyskinesia Coordination:  There is min decremation with RAMs only with toe taps on the L.  This is same as previous.  All  other RAMs are good and WNL Gait and Station: The patient has no difficulty arising out of a deep-seated chair without the use of the hands. The patient's stride length is good with good arm swing today.  He has a neg pull test  I have reviewed and interpreted the following labs independently    Chemistry      Component Value Date/Time   NA 140 06/03/2021 1135   K 4.9 06/03/2021 1135   CL 103 06/03/2021 1135   CO2 31 06/03/2021 1135   BUN 16 06/03/2021 1135   CREATININE 0.87 06/03/2021 1135   CREATININE  0.92 12/05/2019 0901      Component Value Date/Time   CALCIUM 10.0 06/03/2021 1135   ALKPHOS 51 06/03/2021 1135   AST 18 06/03/2021 1135   ALT 5 06/03/2021 1135   BILITOT 0.8 06/03/2021 1135       Lab Results  Component Value Date   WBC 7.0 06/03/2021   HGB 14.8 06/03/2021   HCT 44.0 06/03/2021   MCV 94.3 06/03/2021   PLT 216.0 06/03/2021    Lab Results  Component Value Date   TSH 0.99 05/09/2017     Total time spent on today's visit was ***  minutes, including both face-to-face time and nonface-to-face time.  Time included that spent on review of records (prior notes available to me/labs/imaging if pertinent), discussing treatment and goals, answering patient's questions and coordinating care.  Cc:  Marin Olp, MD

## 2021-12-08 ENCOUNTER — Ambulatory Visit: Payer: Medicare HMO | Admitting: Neurology

## 2021-12-08 ENCOUNTER — Encounter: Payer: Self-pay | Admitting: Neurology

## 2021-12-08 VITALS — BP 124/80 | HR 72 | Ht 75.0 in | Wt 196.2 lb

## 2021-12-08 DIAGNOSIS — G20B2 Parkinson's disease with dyskinesia, with fluctuations: Secondary | ICD-10-CM | POA: Diagnosis not present

## 2021-12-08 MED ORDER — ROPINIROLE HCL 0.25 MG PO TABS: ORAL_TABLET | ORAL | 0 refills | Status: DC

## 2021-12-08 MED ORDER — ROPINIROLE HCL 1 MG PO TABS: 1.0000 mg | ORAL_TABLET | Freq: Three times a day (TID) | ORAL | 1 refills | Status: DC

## 2021-12-08 NOTE — Patient Instructions (Signed)
Start requip (ropinirole) as follows:  requip 0.25 mg three times a day x 1 week and then 2 tablets three times per day for a week and then three pills times  three times a day x 1 week and then 1.0 mg tid thereafter

## 2021-12-14 ENCOUNTER — Telehealth: Payer: Self-pay | Admitting: Anesthesiology

## 2021-12-14 NOTE — Telephone Encounter (Signed)
Pt called stating that minutes after he takes his Carbidopa-Levodopa he starts with shakiness that last from 10-40 minutes. While this is happening he is unable to do his daily activities. Pt requests call back.

## 2021-12-15 NOTE — Telephone Encounter (Signed)
Called pateint left voicemail

## 2021-12-16 NOTE — Telephone Encounter (Signed)
Pt called in and left a message. He was returning Chelsea's call

## 2021-12-17 NOTE — Telephone Encounter (Signed)
Called patient and answered questions he had about his medication

## 2021-12-29 ENCOUNTER — Encounter: Payer: Self-pay | Admitting: Neurology

## 2022-01-14 ENCOUNTER — Telehealth: Payer: Self-pay | Admitting: Neurology

## 2022-01-14 NOTE — Telephone Encounter (Signed)
Pt called in and left a message. He stated his medicine is not working and would like to speak with someone about that. He did not specify which medication.

## 2022-01-17 NOTE — Telephone Encounter (Signed)
Patient feels his first dose is taking 45 min to kick in sometimes longer the second dose doesn't have and off time. Patient has talked to his group at gym with other PD and they are talking to him about the 50/200 CR at night . Patient is having trouble even walking at he would like another

## 2022-01-18 NOTE — Telephone Encounter (Signed)
Called pateint and left voicemail  

## 2022-01-19 NOTE — Telephone Encounter (Signed)
Pt returned call

## 2022-01-19 NOTE — Telephone Encounter (Signed)
Called patient and gave recommendations he understood and had no more questions

## 2022-01-27 DIAGNOSIS — L720 Epidermal cyst: Secondary | ICD-10-CM | POA: Diagnosis not present

## 2022-01-27 DIAGNOSIS — L718 Other rosacea: Secondary | ICD-10-CM | POA: Diagnosis not present

## 2022-01-27 DIAGNOSIS — D225 Melanocytic nevi of trunk: Secondary | ICD-10-CM | POA: Diagnosis not present

## 2022-02-08 ENCOUNTER — Telehealth: Payer: Self-pay | Admitting: Neurology

## 2022-02-08 NOTE — Telephone Encounter (Signed)
Pt called in wanting to go over the timing of his carbidopa-levodopa and some changes he has seen.

## 2022-02-08 NOTE — Telephone Encounter (Signed)
Patient called and is suffering with sleep issues. He is able to get to sleep but not stay asleep he is on Trazodone and taking that before bed. He is also taking his requip three times a day and he is taking carbidopa levodopa 2 pills 5 times a day. Patient wakes up at 3-4 in the morning unable to take his medication till around 7:30-8 he stated he is really struggling with the sleep piece especially hard right now

## 2022-02-10 NOTE — Progress Notes (Signed)
Virtual Visit Via Video       Consent was obtained for video visit:  Yes.   Answered questions that patient had about telehealth interaction:  Yes.   I discussed the limitations, risks, security and privacy concerns of performing an evaluation and management service by telemedicine. I also discussed with the patient that there may be a patient responsible charge related to this service. The patient expressed understanding and agreed to proceed.  Pt location: Home Physician Location: office Name of referring provider:  Marin Olp, MD I connected with Dan [name] at patients initiation/request on 02/11/2022 at  8:15 AM EST by video enabled telemedicine application and verified that I am speaking with the correct person using two identifiers. Pt MRN:  [id number] Pt DOB:  05-23-1955 Video Participants:  Dan [name];    Assessment/Plan:   1.  Parkinsons Disease  -Continue carbidopa/levodopa 25/100, 2 tablets 5  times per day.  -Discussed with patient that we can trial the CR at bedtime.  Take carbidopa/levodopa 25/100 CR q hs  -Continue ropinirole, 1 mg 3 times per day.  -He is really doing great with all of the exercise.  -Discussed dose escalation with him and I would like to avoid dose escalating too much in too fast of a timeframe.  -discussed surgical interventions for future.   2.   Insomnia  -He is taking trazodone now 50 mg at bedtime.  He asks about a higher dose.  I told him we could do that in the future, but want to wait until we trial the above changes first.   Subjective:   Dan [name] was seen today in follow up for Parkinsons disease.  My previous records were reviewed prior to todays visit as well as outside records available to me.  Last visit, the patient asked about extended release levodopa at bedtime as he had heard about it from some of the others in his Parkinson's exercise groups.  However, he was not really having nighttime issues, and we  discussed that wearing off was really his issue and we decided to trial ropinirole.  He is on that medication now and tolerating it well, but emailed me and still wanted to start the nighttime levodopa.  He states he doesn't take the ropinirole first thing in the day but takes it with the 2nd levodopa dosage.  He finds that it works better that way.  He has pushed his first dose back to have more "left for the end of the day."  The ropinirole has allowed him to get an extra hour out of dosages but he struggles in the AM.  He is keeping a cane next to the bed to help him get out of the bed and to the RR.  He struggles between 5am-8:30am)  Current prescribed movement disorder medications: Carbidopa/levodopa 25/100,  2 tablets 5  times per day  Requip, 1 mg 3 times per day (started last visit) Trazodone  PREVIOUS MEDICATIONS:  pramipexole (patient thought worsen depression and cause headache/nausea); rotigotine (given it but didn't take it - worried about it due to cost/upcoming MCR); entacapone (patient stopped after thought not helping); ropinirole (patient felt had side effects at very low dosages and stopped); trazodone   ALLERGIES:   Allergies  Allergen Reactions   Amoxicillin Other (See Comments)    Severe fatigue    CURRENT MEDICATIONS:  Outpatient Encounter Medications as of 02/11/2022  Medication Sig   Alpha-Lipoic Acid 200 MG TABS Take by mouth  daily.    Ascorbic Acid (VITAMIN C) 1000 MG tablet Take 1,000 mg by mouth daily. With rose hips   B Complex Vitamins (VITAMIN B COMPLEX PO) Take by mouth daily.    carbidopa-levodopa (SINEMET IR) 25-100 MG tablet Take 2 pills at 6am/8:30/10:30/1pm/4pmMay take 1 PRN daily if needed   cholecalciferol (VITAMIN D) 1000 units tablet Take 1,000 Units by mouth daily. Vit d 3- Take 0000000 mcg daily   FOLIC ACID PO Take [id number] mg by mouth.    glucosamine-chondroitin 500-400 MG tablet Take 1 tablet by mouth daily.    L-Lysine 500 MG TABS Take 1,000 mg by mouth  daily.    Multiple Vitamin (MULTIVITAMIN) tablet Take 1 tablet by mouth daily.   Omega-3 Fatty Acids (FISH OIL) 1000 MG CAPS Take 1,000 mg by mouth daily.   rOPINIRole (REQUIP) 0.25 MG tablet 1  po tid x 1 week, the 2 po tid x 1 week, 3 po tid   rOPINIRole (REQUIP) 1 MG tablet Take 1 tablet (1 mg total) by mouth 3 (three) times daily.   saw palmetto 160 MG capsule Take 450 mg by mouth daily.    Turmeric (QC TUMERIC COMPLEX PO) Take by mouth.   vitamin B-12 (CYANOCOBALAMIN) 1000 MCG tablet Take 1,000 mcg by mouth daily.   No facility-administered encounter medications on file as of 02/11/2022.    Objective:   PHYSICAL EXAMINATION:    VITALS:   There were no vitals filed for this visit.   GEN:  The patient appears stated age and is in NAD. HEENT:  Normocephalic, atraumatic.    Neurological examination:  Orientation: The patient is alert and oriented x3. Cranial nerves: There is good facial symmetry without facial hypomimia. The speech is fluent and clear. Hearing is intact to conversational tone.    I have reviewed and interpreted the following labs independently    Chemistry      Component Value Date/Time   NA 140 06/03/2021 1135   K 4.9 06/03/2021 1135   CL 103 06/03/2021 1135   CO2 31 06/03/2021 1135   BUN 16 06/03/2021 1135   CREATININE 0.87 06/03/2021 1135   CREATININE 0.92 12/05/2019 0901      Component Value Date/Time   CALCIUM 10.0 06/03/2021 1135   ALKPHOS 51 06/03/2021 1135   AST 18 06/03/2021 1135   ALT 5 06/03/2021 1135   BILITOT 0.8 06/03/2021 1135       Lab Results  Component Value Date   WBC 7.0 06/03/2021   HGB 14.8 06/03/2021   HCT 44.0 06/03/2021   MCV 94.3 06/03/2021   PLT 216.0 06/03/2021    Lab Results  Component Value Date   TSH 0.99 05/09/2017      Cc:  Marin Olp, MD

## 2022-02-11 ENCOUNTER — Telehealth (INDEPENDENT_AMBULATORY_CARE_PROVIDER_SITE_OTHER): Payer: Medicare HMO | Admitting: Neurology

## 2022-02-11 ENCOUNTER — Encounter: Payer: Self-pay | Admitting: Neurology

## 2022-02-11 DIAGNOSIS — G20B2 Parkinson's disease with dyskinesia, with fluctuations: Secondary | ICD-10-CM | POA: Diagnosis not present

## 2022-02-11 MED ORDER — CARBIDOPA-LEVODOPA ER 25-100 MG PO TBCR: 1.0000 | EXTENDED_RELEASE_TABLET | Freq: Every day | ORAL | 1 refills | Status: DC

## 2022-02-17 DIAGNOSIS — G20A1 Parkinson's disease without dyskinesia, without mention of fluctuations: Secondary | ICD-10-CM | POA: Diagnosis not present

## 2022-02-17 DIAGNOSIS — Z8042 Family history of malignant neoplasm of prostate: Secondary | ICD-10-CM | POA: Diagnosis not present

## 2022-02-17 DIAGNOSIS — R972 Elevated prostate specific antigen [PSA]: Secondary | ICD-10-CM | POA: Diagnosis not present

## 2022-02-23 ENCOUNTER — Other Ambulatory Visit: Payer: Self-pay

## 2022-02-23 DIAGNOSIS — R972 Elevated prostate specific antigen [PSA]: Secondary | ICD-10-CM

## 2022-02-25 ENCOUNTER — Other Ambulatory Visit: Payer: Self-pay | Admitting: Urology

## 2022-02-25 ENCOUNTER — Telehealth: Payer: Self-pay | Admitting: Neurology

## 2022-02-25 DIAGNOSIS — R972 Elevated prostate specific antigen [PSA]: Secondary | ICD-10-CM

## 2022-02-25 NOTE — Telephone Encounter (Signed)
Pt called in stating when he takes his Ropinirole it makes him very shaky. He is wondering if he is getting too medicated?  He is interested in any dietary restrictions that could interfere with his medication.

## 2022-02-25 NOTE — Telephone Encounter (Signed)
Called patient and informed him that Dr. Carles Collet would just ask that he continue it.  He called about a week ago and wanted more medication and insisted we add nighttime levodopa, which we did.   Its just not good to change medication as often as he wants it changed, which we discussed when Dr. Carles Collet saw him on video recently.  Patient stated "ok" and thanked me for the call.

## 2022-03-02 ENCOUNTER — Other Ambulatory Visit (HOSPITAL_BASED_OUTPATIENT_CLINIC_OR_DEPARTMENT_OTHER): Payer: Self-pay

## 2022-03-14 ENCOUNTER — Encounter: Payer: Self-pay | Admitting: Urology

## 2022-03-22 ENCOUNTER — Encounter: Payer: Self-pay | Admitting: Urology

## 2022-03-22 ENCOUNTER — Telehealth: Payer: Self-pay | Admitting: Anesthesiology

## 2022-03-22 NOTE — Telephone Encounter (Signed)
Called patient back; left voicemail.

## 2022-03-22 NOTE — Telephone Encounter (Signed)
Caller left message with AN stating he has questions about the packet.

## 2022-03-23 NOTE — Telephone Encounter (Signed)
Was able to contact with patient he is having off periods so severe he is now using a walker. Once his meds kick in he is doing ok and taking more classes to build balance but he is really struggling during the off periods and seeing if there is anything else he can try. He mentioned you had suggested Nurtec in the past but because of cost he was unable to do it

## 2022-03-23 NOTE — Telephone Encounter (Signed)
Patient is returning a call to [employer]

## 2022-03-23 NOTE — Telephone Encounter (Signed)
Called pateint back

## 2022-03-24 ENCOUNTER — Telehealth: Payer: Self-pay | Admitting: Neurology

## 2022-03-24 ENCOUNTER — Ambulatory Visit
Admission: RE | Admit: 2022-03-24 | Discharge: 2022-03-24 | Disposition: A | Discharge status: Home / Self Care | Payer: Medicare HMO | Source: Ambulatory Visit | Attending: Urology | Admitting: Urology

## 2022-03-24 DIAGNOSIS — R972 Elevated prostate specific antigen [PSA]: Secondary | ICD-10-CM

## 2022-03-24 MED ORDER — GADOPICLENOL 0.5 MMOL/ML IV SOLN
8.0000 mL | Freq: Once | INTRAVENOUS | Status: AC | PRN
  Administered 2022-03-24: 8 mL via INTRAVENOUS

## 2022-03-24 NOTE — Telephone Encounter (Signed)
Left message with the after hour service on 03-24-22 at 12:47 pm   Caller states he is returning a call

## 2022-03-25 NOTE — Telephone Encounter (Signed)
Patient called back and left voicemail that he also wanted to know if he could increase his trazodone as he is unable to sleep most nights

## 2022-03-28 NOTE — Telephone Encounter (Signed)
Called patient back and was speaking with him about Dr. Arturo Morton recommendations. He did tell me he is not getting enough sleep and fell asleep in church because he is so tried. Patient tried an experiment and took 100 mg of Trazodone and he said it worked wonderful he received 7 hours of sleep and has only done this once but he is saying it really helped his sleep. He is asking permission to up the to Trazodone 100 mg to help with his sleep

## 2022-04-05 ENCOUNTER — Other Ambulatory Visit: Payer: Self-pay | Admitting: Neurology

## 2022-04-05 DIAGNOSIS — G20A1 Parkinson's disease without dyskinesia, without mention of fluctuations: Secondary | ICD-10-CM

## 2022-04-14 ENCOUNTER — Ambulatory Visit: Payer: Medicare HMO

## 2022-04-14 ENCOUNTER — Ambulatory Visit: Payer: Medicare HMO | Attending: Neurology | Admitting: Occupational Therapy

## 2022-04-14 ENCOUNTER — Telehealth: Payer: Self-pay | Admitting: Anesthesiology

## 2022-04-14 DIAGNOSIS — R29818 Other symptoms and signs involving the nervous system: Secondary | ICD-10-CM | POA: Insufficient documentation

## 2022-04-14 DIAGNOSIS — R471 Dysarthria and anarthria: Secondary | ICD-10-CM | POA: Insufficient documentation

## 2022-04-14 NOTE — Therapy (Signed)
[place] [place] [facility] [address] [place], [place], [address] Phone: [phone]   Fax:  [phone]  Patient Details  Name: Dan [name] MRN: [id number] Date of Birth: [birth year] Referring Provider:  Kerin Salen, DO  Encounter Date: 04/14/2022 Encounter Date: 04/14/2022  Speech Therapy Parkinson's Disease Screen         Decibel Level today: low 70s dB  (WNL=70-72 dB) with sound level meter 30cm away from pt's masked mouth. Pt's conversational volume has remained unchanged since last screen.    Pt does not not report difficulty with swallowing, which does not warrant further evaluation   Pt does does not require speech therapy services at this time. Recommend ST screen at same time of PT re-screen in early 2025.  Veryl Abril, CCC-SLP 04/14/2022, 11:59 AM  [place] [facility] [address] [place], [place], [address] Phone: [phone]   Fax:  [phone]

## 2022-04-14 NOTE — Therapy (Signed)
[facility] [address], [address] [place], [place], [address] Phone: [phone]   Fax:  [phone]  Patient Details  Name: Dan [name] MRN: [id number] Date of Birth: June 30, 1955 Referring Provider:  Shelva Majestic, MD  Encounter Date: 04/14/2022  Occupational Therapy Parkinson's Disease Screen  Hand dominance:  right   Fastening/unfastening 3 buttons in:  25.28sec  9-hole peg test:    RUE  26.87 sec (dropped one peg)       LUE  33.75 sec  Other Comments:  Pt reports going to the PD classes at [facility] and has begun going to yoga.  Pt reports no issues, concerns with ADLs/IADLs.  Pt reports PD medicines are really helpful and notices no issues, however does report that he seems to get about 2 hours before he notices a decrease in effectiveness.  Encouraged pt to speak with Dr. Arbutus Leas in regards to medication concerns.  Pt does not require occupational therapy services at this time.  Recommended occupational therapy screen in   6-9 months   [place], [place], OT 04/14/2022, 12:10 PM  [place] [facility] [address], [address] [place], [place], [address] Phone: [phone]   Fax:  [phone]

## 2022-04-14 NOTE — Telephone Encounter (Signed)
Pt called stating he has a question regarding his medications Requip and Carvidopa-Levodopa. States the medication effect wears off quick.

## 2022-04-15 NOTE — Telephone Encounter (Signed)
Really wants to try a PA for Neupro patches really wants to try to take this again

## 2022-04-22 ENCOUNTER — Telehealth: Payer: Self-pay | Admitting: Neurology

## 2022-04-22 NOTE — Telephone Encounter (Signed)
Pt called in stating he has been having trouble with sleeping. He gets to sleep, but doesn't stay asleep. He is wondering if the trazedone and carbidopa-levodopa ER that he takes at bedtime is affecting anything and is questioning what each of them do for him? Him not sleeping is really affecting his energy.

## 2022-04-22 NOTE — Telephone Encounter (Signed)
Called patient and gave Dr. Iona Beard recommendations patient understood and said he would call PCP

## 2022-05-05 ENCOUNTER — Encounter: Payer: Self-pay | Admitting: Family Medicine

## 2022-05-05 ENCOUNTER — Ambulatory Visit (INDEPENDENT_AMBULATORY_CARE_PROVIDER_SITE_OTHER): Payer: Medicare HMO | Admitting: Family Medicine

## 2022-05-05 VITALS — BP 128/70 | HR 78 | Temp 98.5°F | Ht 75.0 in | Wt 203.0 lb

## 2022-05-05 DIAGNOSIS — G4700 Insomnia, unspecified: Secondary | ICD-10-CM

## 2022-05-05 MED ORDER — TRAZODONE HCL 50 MG PO TABS: 50.0000 mg | ORAL_TABLET | Freq: Every evening | ORAL | 5 refills | Status: DC | PRN

## 2022-05-05 NOTE — Patient Instructions (Addendum)
Let us know if you get any other COVID vaccines.   Trial trazodone up to 1.5 tablets- if really need to for a tough night could use 2 so 100 mg total

## 2022-05-05 NOTE — Progress Notes (Signed)
Phone [phone] In person visit   Subjective:   Dan [name] is a 67 y.o. year old very pleasant male patient who presents for/with See problem oriented charting Chief Complaint  Patient presents with   sleep issues    Pt has no problem falling asleep but c/o once he is up to use the bathroom its hard to go back to sleep.   Past Medical History-  Patient Active Problem List   Diagnosis Date Noted   Hypotestosteronemia 11/05/2015    Priority: High   Elevated PSA 10/30/2014    Priority: High   Parkinson's disease 10/30/2014    Priority: High   Low serum vitamin D 04/16/2015    Priority: Medium    Hyperlipidemia 05/24/2010    Priority: Medium    Anxiety state 08/11/2006    Priority: Medium    History of colon polyps 01/08/2018    Priority: Low   Bleeding hemorrhoid 05/24/2010    Priority: Low   Irritable bowel syndrome 08/29/2007    Priority: Low    Medications- reviewed and updated Current Outpatient Medications  Medication Sig Dispense Refill   Alpha-Lipoic Acid 200 MG TABS Take by mouth daily.      Ascorbic Acid (VITAMIN C) 1000 MG tablet Take 1,000 mg by mouth daily. With rose hips     B Complex Vitamins (VITAMIN B COMPLEX PO) Take by mouth daily.      carbidopa-levodopa (SINEMET IR) 25-100 MG tablet Take 2 pills at 6am/8:30/10:30/1pm/4pm May take 1 PRN daily if needed 915 tablet 0   Carbidopa-Levodopa ER (SINEMET CR) 25-100 MG tablet controlled release Take 1 tablet by mouth at bedtime. 90 tablet 1   cholecalciferol (VITAMIN D) 1000 units tablet Take 1,000 Units by mouth daily. Vit d 3- Take 125 mcg daily     FOLIC ACID PO Take 400 mg by mouth.      glucosamine-chondroitin 500-400 MG tablet Take 1 tablet by mouth daily.      L-Lysine 500 MG TABS Take 1,000 mg by mouth daily.      Multiple Vitamin (MULTIVITAMIN) tablet Take 1 tablet by mouth daily.     Omega-3 Fatty Acids (FISH OIL) 1000 MG CAPS Take 1,000 mg by mouth daily.     rOPINIRole (REQUIP) 0.25 MG  tablet 1  po tid x 1 week, the 2 po tid x 1 week, 3 po tid 73 tablet 0   rOPINIRole (REQUIP) 1 MG tablet Take 1 tablet (1 mg total) by mouth 3 (three) times daily. 270 tablet 1   saw palmetto 160 MG capsule Take 450 mg by mouth daily.      Turmeric (QC TUMERIC COMPLEX PO) Take by mouth.     vitamin B-12 (CYANOCOBALAMIN) 1000 MCG tablet Take 1,000 mcg by mouth daily.     No current facility-administered medications for this visit.     Objective:  BP 128/70   Pulse 78   Temp 98.5 F (36.9 C)   Ht 6\' 3"  (1.905 m)   Wt 203 lb (92.1 kg)   SpO2 95%   BMI 25.37 kg/m  Gen: NAD, resting comfortably CV: RRR no murmurs rubs or gallops Lungs: CTAB no crackles, wheeze, rhonchi Neuro: Walks with cane today    Assessment and Plan   # Sleep maintenance insomnia S: Patient reports he is able to fall asleep without difficulty but he usually wakes up to urinate around 3-4 am after going to sleep at 9: 30 AM.  Finds it very difficult to go  back to sleep- if has tea and reads if wakes up early enough in night can get back to bed . Hard to get to bathroom at night as feels tight with his parkinsons- has to use walker to get to the bathroom. Takes 1 carbidopa levodopa at bedtime 25-100 mg.   We have previously discussed stopping TV before bedtime at least 30 minutes but he reported this was not very effective.  We also discussed possible cognitive behavioral therapy.  He also had trazodone available through Dr. And recommended continuing this as needed- he reports this has not been helpful.  A/P: Sleep maintenance insomnia poorly controlled with trazodone 50 mg but with good control on trazodone 100 mg-we opted to trial 75 mg - Also extensive sleep hygiene discussion today -We talked about backup options of Doxepin and belsomra if this is not effective (likely would max out at 100 mg trazodone) -he asks about clonazepam but we discussed increases risk of falls and would be particularly dangerous with his  Parkinson's  #Prostate lesion- working with Dr. Earlene Plater and has upcoming biopsy.   #new floater in right eye starting this week- he plans to call eye doctor- basic eye exam non dilated in office reassuring but discussed this does not rule everything out   Recommended follow up: Return for next already scheduled visit or sooner if needed. Future Appointments  Date Time Provider Department Center  06/09/2022  8:30 AM [facility] LAB [facility]  06/16/2022 10:00 AM Shelva Majestic, MD [facility]  06/29/2022 11:15 AM Tat, Octaviano Batty, DO [facility] None  07/14/2022 12:00 PM Gean Maidens, PT [facility]  09/23/2022  8:45 AM [facility] ANNUAL WELLNESS VISIT 1 [facility]  02/16/2023 11:45 AM Dillard Essex, OT [facility]  02/16/2023 12:00 PM Gean Maidens, PT [facility]  02/16/2023 12:15 PM Schinke, Karie Georges, CCC-SLP [facility]    Lab/Order associations:   ICD-10-CM   1. Insomnia, unspecified type  G47.00       Meds ordered this encounter  Medications   traZODone (DESYREL) 50 MG tablet    Sig: Take 1-1.5 tablets (50-75 mg total) by mouth at bedtime as needed for sleep.    Dispense:  45 tablet    Refill:  5    Time Spent: 31 minutes of total time (11:11 AM-11:40 AM, 802- 8:04 PM) was spent on the date of the encounter performing the following actions: chart review prior to seeing the patient, obtaining history, counseling on the treatment plan as well as any particular about sleep hygiene and benefits and risks of various sleep aids, placing orders, and documenting in our EHR.    Return precautions advised.  Tana Conch, MD

## 2022-05-26 ENCOUNTER — Ambulatory Visit: Payer: Medicare HMO | Admitting: Family Medicine

## 2022-05-31 ENCOUNTER — Telehealth: Payer: Self-pay | Admitting: Neurology

## 2022-05-31 NOTE — Telephone Encounter (Signed)
Called Patient left message he is good to do biopsy tomorrow. No problem with PD meds

## 2022-05-31 NOTE — Telephone Encounter (Signed)
Pt called in stating he was going to be having a needle biopsy tomorrow and has never had that done before while on parkinson's medication. He would like to make sure Dr. Arbutus Leas is aware and he doesn't need to change anything?

## 2022-06-01 DIAGNOSIS — C61 Malignant neoplasm of prostate: Secondary | ICD-10-CM | POA: Diagnosis not present

## 2022-06-01 DIAGNOSIS — G20A1 Parkinson's disease without dyskinesia, without mention of fluctuations: Secondary | ICD-10-CM | POA: Diagnosis not present

## 2022-06-01 DIAGNOSIS — F411 Generalized anxiety disorder: Secondary | ICD-10-CM | POA: Diagnosis not present

## 2022-06-01 DIAGNOSIS — N411 Chronic prostatitis: Secondary | ICD-10-CM | POA: Diagnosis not present

## 2022-06-01 DIAGNOSIS — Z79899 Other long term (current) drug therapy: Secondary | ICD-10-CM | POA: Diagnosis not present

## 2022-06-01 DIAGNOSIS — R972 Elevated prostate specific antigen [PSA]: Secondary | ICD-10-CM | POA: Diagnosis not present

## 2022-06-01 DIAGNOSIS — N410 Acute prostatitis: Secondary | ICD-10-CM | POA: Diagnosis not present

## 2022-06-07 ENCOUNTER — Telehealth: Payer: Self-pay | Admitting: Neurology

## 2022-06-07 NOTE — Telephone Encounter (Signed)
Pt called and states that his medication Carbidopa levodopa is not working. He states that it works for a short period of time and then does not work any longer. He states that it very hard to walk once it wears off  please call   He has appt on 06-29-22 with Dr tat

## 2022-06-08 NOTE — Telephone Encounter (Signed)
Patient still has Entacapone and is wondering if he can restart . Patient was diagnosed with prostate cancer .Pateitn wants to catch up with Dr. Arbutus Leas about the next few months with treatments for his cancer ramp up

## 2022-06-09 ENCOUNTER — Other Ambulatory Visit (INDEPENDENT_AMBULATORY_CARE_PROVIDER_SITE_OTHER): Payer: Medicare HMO

## 2022-06-09 DIAGNOSIS — R7989 Other specified abnormal findings of blood chemistry: Secondary | ICD-10-CM | POA: Diagnosis not present

## 2022-06-09 DIAGNOSIS — E785 Hyperlipidemia, unspecified: Secondary | ICD-10-CM

## 2022-06-09 LAB — COMPREHENSIVE METABOLIC PANEL
ALT: 3 U/L (ref 0–53)
AST: 13 U/L (ref 0–37)
Albumin: 4.2 g/dL (ref 3.5–5.2)
Alkaline Phosphatase: 61 U/L (ref 39–117)
BUN: 16 mg/dL (ref 6–23)
CO2: 27 mEq/L (ref 19–32)
Calcium: 9.1 mg/dL (ref 8.4–10.5)
Chloride: 104 mEq/L (ref 96–112)
Creatinine, Ser: 0.8 mg/dL (ref 0.40–1.50)
GFR: 92.07 mL/min (ref >60.00)
Glucose, Bld: 101 mg/dL — ABNORMAL HIGH (ref 70–99)
Potassium: 3.8 mEq/L (ref 3.5–5.1)
Sodium: 140 mEq/L (ref 135–145)
Total Bilirubin: 1.1 mg/dL (ref 0.2–1.2)
Total Protein: 6.7 g/dL (ref 6.0–8.3)

## 2022-06-09 LAB — CBC WITH DIFFERENTIAL/PLATELET
Basophils Absolute: 0 10*3/uL (ref 0.0–0.1)
Basophils Relative: 0.6 % (ref 0.0–3.0)
Eosinophils Absolute: 0.1 10*3/uL (ref 0.0–0.7)
Eosinophils Relative: 0.9 % (ref 0.0–5.0)
HCT: 43.9 % (ref 39.0–52.0)
Hemoglobin: 14.8 g/dL (ref 13.0–17.0)
Lymphocytes Relative: 28.7 % (ref 12.0–46.0)
Lymphs Abs: 1.6 10*3/uL (ref 0.7–4.0)
MCHC: 33.6 g/dL (ref 30.0–36.0)
MCV: 94.9 fl (ref 78.0–100.0)
Monocytes Absolute: 0.5 10*3/uL (ref 0.1–1.0)
Monocytes Relative: 9.5 % (ref 3.0–12.0)
Neutro Abs: 3.4 10*3/uL (ref 1.4–7.7)
Neutrophils Relative %: 60.3 % (ref 43.0–77.0)
Platelets: 210 10*3/uL (ref 150.0–400.0)
RBC: 4.63 Mil/uL (ref 4.22–5.81)
RDW: 13.8 % (ref 11.5–15.5)
WBC: 5.7 10*3/uL (ref 4.0–10.5)

## 2022-06-09 LAB — LIPID PANEL
Cholesterol: 177 mg/dL (ref 0–200)
HDL: 43.5 mg/dL (ref >39.00)
LDL Cholesterol: 104 mg/dL — ABNORMAL HIGH (ref 0–99)
NonHDL: 133.79
Total CHOL/HDL Ratio: 4
Triglycerides: 147 mg/dL (ref 0.0–149.0)
VLDL: 29.4 mg/dL (ref 0.0–40.0)

## 2022-06-09 LAB — VITAMIN D 25 HYDROXY (VIT D DEFICIENCY, FRACTURES): VITD: 42.95 ng/mL (ref 30.00–100.00)

## 2022-06-10 ENCOUNTER — Telehealth: Payer: Self-pay | Admitting: Neurology

## 2022-06-10 NOTE — Telephone Encounter (Signed)
Pt called in and left a message. He would like to speak with someone about his medication.

## 2022-06-10 NOTE — Telephone Encounter (Signed)
Patient is going to try the  Entacapone 3 times with the first three doses of the day to see if that will help until his upcoming appointment

## 2022-06-10 NOTE — Telephone Encounter (Signed)
Called patient back and gave patient Dr. Jorge Ny advice of taking the entacapone. Patient struggling to sleep and patient struggling  to feel ok

## 2022-06-16 ENCOUNTER — Encounter: Payer: Self-pay | Admitting: Family Medicine

## 2022-06-16 ENCOUNTER — Ambulatory Visit (INDEPENDENT_AMBULATORY_CARE_PROVIDER_SITE_OTHER): Payer: Medicare HMO | Admitting: Family Medicine

## 2022-06-16 ENCOUNTER — Ambulatory Visit: Payer: Medicare HMO | Admitting: Neurology

## 2022-06-16 VITALS — BP 126/74 | HR 71 | Temp 98.8°F | Ht 75.0 in | Wt 202.4 lb

## 2022-06-16 DIAGNOSIS — Z0000 Encounter for general adult medical examination without abnormal findings: Secondary | ICD-10-CM

## 2022-06-16 DIAGNOSIS — G20A2 Parkinson's disease without dyskinesia, with fluctuations: Secondary | ICD-10-CM

## 2022-06-16 NOTE — Progress Notes (Signed)
Phone: [phone]   Subjective:  Patient presents today for their annual physical. Chief complaint-noted.   See problem oriented charting- ROS- full  review of systems was completed and negative  except for: nosebleeds, tinnitus unchanged, mobility issues with parkinsons  The following were reviewed and entered/updated in epic: Past Medical History:  Diagnosis Date   Allergy    seasonal   Anxiety    Bleeding hemorrhoid 05/24/2010   Depression 08/11/2006   Anxiety as well around down market- tough time with job loss in [place]     Hyperlipidemia LDL goal < 130 05/24/2010   Irritable bowel syndrome 08/29/2007   Improved with diet change     Parkinson disease    since 2016   Patient Active Problem List   Diagnosis Date Noted   Hypotestosteronemia 11/05/2015    Priority: High   Elevated PSA 10/30/2014    Priority: High   Parkinson's disease 10/30/2014    Priority: High   Low serum vitamin D 04/16/2015    Priority: Medium    Hyperlipidemia 05/24/2010    Priority: Medium    Anxiety state 08/11/2006    Priority: Medium    History of colon polyps 01/08/2018    Priority: Low   Bleeding hemorrhoid 05/24/2010    Priority: Low   Irritable bowel syndrome 08/29/2007    Priority: Low   Past Surgical History:  Procedure Laterality Date   APPENDECTOMY     COLONOSCOPY  05-01-00   HERNIA REPAIR  2014   Bil inguinal hernia repair   ROOT CANAL     TONSILLECTOMY  1958    Family History  Problem Relation Age of Onset   Hypertension Mother    Breast cancer Mother    Ovarian cancer Mother        in her 12s   Prostate cancer Father        complications from radioactive seeds, colostomy for last 10 years    Medications- reviewed and updated Current Outpatient Medications  Medication Sig Dispense Refill   Alpha-Lipoic Acid 200 MG TABS Take by mouth daily.      Ascorbic Acid (VITAMIN C) 1000 MG tablet Take 1,000 mg by mouth daily. With rose hips     B Complex Vitamins  (VITAMIN B COMPLEX PO) Take by mouth daily.      carbidopa-levodopa (SINEMET IR) 25-100 MG tablet Take 2 pills at 6am/8:30/10:30/1pm/4pm May take 1 PRN daily if needed 915 tablet 0   cholecalciferol (VITAMIN D) 1000 units tablet Take 1,000 Units by mouth daily. Vit d 3- Take 125 mcg daily     FOLIC ACID PO Take 400 mg by mouth.      glucosamine-chondroitin 500-400 MG tablet Take 1 tablet by mouth daily.      L-Lysine 500 MG TABS Take 1,000 mg by mouth daily.      Multiple Vitamin (MULTIVITAMIN) tablet Take 1 tablet by mouth daily.     Omega-3 Fatty Acids (FISH OIL) 1000 MG CAPS Take 1,000 mg by mouth daily.     rOPINIRole (REQUIP) 1 MG tablet Take 1 tablet (1 mg total) by mouth 3 (three) times daily. 270 tablet 1   saw palmetto 160 MG capsule Take 450 mg by mouth daily.      traZODone (DESYREL) 50 MG tablet Take 1-1.5 tablets (50-75 mg total) by mouth at bedtime as needed for sleep. 45 tablet 5   Turmeric (QC TUMERIC COMPLEX PO) Take by mouth.     vitamin B-12 (CYANOCOBALAMIN) 1000 MCG  tablet Take 1,000 mcg by mouth daily.     No current facility-administered medications for this visit.    Allergies-reviewed and updated Allergies  Allergen Reactions   Amoxicillin Other (See Comments)    Severe fatigue    Social History   Social History Narrative   Lives alone. Long term partner - does not live with him. Home is two levels      Retired  September 2023 from [employer].    Formerly Development worker, community.    Goes to [employer]      Hobbies: Exercises regularly, relax with friends   Right handed   Caffeine 1 cup daily   Objective  Objective:  BP 126/74   Pulse 71   Temp 98.8 F (37.1 C)   Ht 6\' 3"  (1.905 m)   Wt 202 lb 6.4 oz (91.8 kg)   SpO2 97%   BMI 25.30 kg/m  Gen: NAD, resting comfortably HEENT: Mucous membranes are moist. Oropharynx normal Neck: no thyromegaly CV: RRR no murmurs rubs or gallops Lungs: CTAB no  crackles, wheeze, rhonchi Abdomen: soft/nontender/nondistended/normal bowel sounds. No rebound or guarding.  Ext: no edema Skin: warm, dry Neuro: grossly normal, moves all extremities, PERRLA   Assessment and Plan  67 y.o. male presenting for annual physical.  Health Maintenance counseling: 1. Anticipatory guidance: Patient counseled regarding regular dental exams -q6 months, eye exams -yearly- just had visit- good exam- prior floater not concern,  avoiding smoking and second hand smoke , limiting alcohol to 2 beverages per day -not at all now, no illicit drugs.   2. Risk factor reduction:  Advised patient of need for regular exercise and diet rich and fruits and vegetables to reduce risk of heart attack and stroke.  Exercise- [place] started back this week after biopsy regularly .  Diet/weight management-weight within 2 lbs last year- reasonable weight for age and muscle mass.  Wt Readings from Last 3 Encounters:  06/16/22 202 lb 6.4 oz (91.8 kg)  05/05/22 203 lb (92.1 kg)  12/08/21 196 lb 3.2 oz (89 kg)  3. Immunizations/screenings/ancillary studies- COVID - last 2022- if gets another would consider in the fall  Immunization History  Administered Date(s) Administered   Fluad Quad(high Dose 65+) 08/03/2021   Influenza Whole 01/03/2002   Influenza, High Dose Seasonal PF 10/24/2020   Influenza,inj,Quad PF,6+ Mos 08/19/2017, 09/04/2018   Influenza-Unspecified 09/21/2014, 10/08/2015, 08/31/2016, 10/04/2019   PFIZER(Purple Top)SARS-COV-2 Vaccination 03/19/2019, 04/15/2019, 10/15/2019   PNEUMOCOCCAL CONJUGATE-20 06/03/2021   Pfizer Covid-19 Vaccine Bivalent Booster 84yrs & up 10/24/2020   Td 10/03/1996, 11/05/2007   Tdap 11/10/2017   Zoster Recombinat (Shingrix) 05/25/2016, 10/20/2016   Zoster, Live 11/18/2015  4. Prostate cancer screening- see discussion below for cancer follow up    5. Colon cancer screening - 12/22/17 with 5 year follow up planned 6. Skin cancer screening- [place] derm  Dr. Yetta Barre. advised regular sunscreen use. Denies worrisome, changing, or new skin lesions.  7. Smoking associated screening (lung cancer screening, AAA screen 65-75, UA)- never smoker 8. STD screening -  only active with long term partner- opts out    Status of chronic or acute concerns   #left nostril brief nosebleeds at times- not major issue- wants to monitor- worse when blowing nose  # Parkinson's- follow up with Dr. Arbutus Leas S: Medication: Sinemet instant release 2 tablets at 6 AM, 8:30 AM AM, 10:30 AM/ 1 pm, 4 PM- only gets about 2 hours of benefit and taking longer  for it to kick in- not able to do much when not kicked in - reduced to using walker. Keeps cane at all time sto be on safe side even if feeling well. No falls still!  A/P: having some issues when medications wear off- upcoming visit with Dr. Arbutus Leas to consider adjustments- he also wants to reestablish with Dr. Rubin Payor for 2nd opinion    #prostate cancer- unfortunately recently diagnosed with prostate cancer with Dr. Earlene Plater [facility] . Sees oncology July 1st for more information.   #hyperlipidemia- have offered CT calcium scoring in past  S: Medication:none The 10-year ASCVD risk score (Arnett DK, et al., 2019) is: 13.3%   Lab Results  Component Value Date   CHOL 177 06/09/2022   HDL 43.50 06/09/2022   LDLCALC 104 (H) 06/09/2022   TRIG 147.0 06/09/2022   CHOLHDL 4 06/09/2022  A/P: lipids only mildly elevated- offered CT calcium scoring again- he wants to discuss in 6 months  #insomnia- trial trazodone 75 mg last visit  for sleep maintenance insomnia but felt more sluggish so went back to 1 tablet at 50 mg nightly - mainly avoiding today's visit   #Vitamin D deficiency S: Medication: 1000 units dialy  Last vitamin D Lab Results  Component Value Date   VD25OH 42.95 06/09/2022  A/P:  well controlled continue current medications    Recommended follow up: Return in about 6 months (around 12/16/2022) for followup or sooner  if needed.Schedule b4 you leave. Future Appointments  Date Time Provider Department Center  06/29/2022 11:15 AM Tat, Octaviano Batty, DO [facility] None  07/14/2022 12:00 PM Gean Maidens, PT [facility]  09/23/2022  8:45 AM [facility] ANNUAL WELLNESS VISIT 1 [facility] PEC  02/16/2023 11:45 AM Dillard Essex, OT [facility]  02/16/2023 12:00 PM Gean Maidens, PT [facility]  02/16/2023 12:15 PM Schinke, Karie Georges, CCC-SLP [facility]   Lab/Order associations:already had labs   ICD-10-CM   1. Routine general medical examination at a health care facility  Z00.00     2. Parkinson's disease with fluctuating manifestations, unspecified whether dyskinesia present  G20.A2 Ambulatory referral to Neurology    CANCELED: Ambulatory referral to Neurology     No orders of the defined types were placed in this encounter.  Return precautions advised.  Tana Conch, MD

## 2022-06-16 NOTE — Patient Instructions (Addendum)
Glad you are doing reasonably well and labs look pretty good  and that you are in good hands with the prostate cancer  We placed referral for you at your request for [facility]   Recommended follow up: Return in about 6 months (around 12/16/2022) for followup or sooner if needed.Schedule b4 you leave.

## 2022-06-27 NOTE — Progress Notes (Unsigned)
Assessment/Plan:     1.  Parkinsons Disease             -Continue carbidopa/levodopa 25/100, 2 tablets 5  times per day.             continue carbidopa/levodopa 25/100 CR q hs             -Continue ropinirole, 1 mg 3 times per day.  Discussed increasing but decided to hold to make other changes  -stop entacapone  -start opicapone, 50 mg q hs.  R/b/se discussed.               -He is really doing great with all of the exercise.             -discussed surgical interventions, esp as protein seems to be interfering with levodopa.  He has other medical issues going on right now (getting ready to start tx for prostate CA) and can be discussed more in future.    -We discussed his care in detail.  Patient is going to be transferring his care to Dr. Rubin Payor and currently has appt there already.  Wished him the best!  -discussed counseling as part of multidisciplinary care.  He is going through a lot with his Parkinsons Disease, and new dx of prostate CA and encouraged him to think about this.       2.   Insomnia             -He is taking trazodone now 50 mg at bedtime.     Subjective:   Dan [name] was seen today in follow up for Parkinsons disease.  My previous records were reviewed prior to todays visit as well as outside records available to me.  Pt with friend (derek) who supplements hx.  I have also reviewed the phone calls that we have received from the patient since last visit.  We added CR levodop3a last visit. he doesn't find this helpful.   He has called other times about other medications as well.  I was gone in early June and patient called and wanted to restart the entacapone that he had tried in the past.  My partners had him do that.  He sometimes takes it bid and sometimes tid.  He cannot figure out if it helps or not.  He notes wearing off inconsistently (sometimes he has it, sometimes not, independent of whether or not he took the entacapone).   He has also called several  times about his sleep issues, but several of these issues revolve around urinary issues and we told him he really needed to follow-up with primary care.   He saw primary care right after that, but I do not see that they discussed sleep. He does state that he did realize that the bladder issues and new dx of prostate CA were really the issue with the sleep.    I do see that patient did request transfer of care to Dr. Rubin Payor.  He has seen Dr. Rubin Payor in the past.  We discussed today.  He is under more stress and stress will bring out the tremor.  Current prescribed movement disorder medications: Carbidopa/levodopa 25/100,  2 tablets 5  times per day (6am/8:30/10:30/1pm/4pm)  Carbidopa/levodopa 25/100 CR at bedtime  Entacapone, 200 mg 1 with first 3 dosages of levodopa (he's actually taking this with 2nd/4th only) Ropinirole, 1 mg tid Trazodone   PREVIOUS MEDICATIONS:  pramipexole (patient thought worsen depression and cause headache/nausea); rotigotine (given it  but didn't take it - worried about it due to cost/upcoming MCR); entacapone (patient stopped after thought not helping but ended up restarting); ropinirole (patient felt had side effects at very low dosages and stopped but ended up restarting); trazodone   ALLERGIES:   Allergies  Allergen Reactions   Amoxicillin Other (See Comments)    Severe fatigue    CURRENT MEDICATIONS:  Outpatient Encounter Medications as of 06/29/2022  Medication Sig   Alpha-Lipoic Acid 200 MG TABS Take by mouth daily.    Ascorbic Acid (VITAMIN C) 1000 MG tablet Take 1,000 mg by mouth daily. With rose hips   B Complex Vitamins (VITAMIN B COMPLEX PO) Take by mouth daily.    carbidopa-levodopa (SINEMET IR) 25-100 MG tablet Take 2 pills at 6am/8:30/10:30/1pm/4pm May take 1 PRN daily if needed   cholecalciferol (VITAMIN D) 1000 units tablet Take 1,000 Units by mouth daily. Vit d 3- Take 125 mcg daily   entacapone (COMTAN) 200 MG tablet Take 200 mg by mouth 3  (three) times daily.   FOLIC ACID PO Take 400 mg by mouth.    glucosamine-chondroitin 500-400 MG tablet Take 1 tablet by mouth daily.    L-Lysine 500 MG TABS Take 1,000 mg by mouth daily.    Multiple Vitamin (MULTIVITAMIN) tablet Take 1 tablet by mouth daily.   Omega-3 Fatty Acids (FISH OIL) 1000 MG CAPS Take 1,000 mg by mouth daily.   rOPINIRole (REQUIP) 1 MG tablet Take 1 tablet (1 mg total) by mouth 3 (three) times daily.   saw palmetto 160 MG capsule Take 450 mg by mouth daily.    traZODone (DESYREL) 50 MG tablet Take 1-1.5 tablets (50-75 mg total) by mouth at bedtime as needed for sleep.   Turmeric (QC TUMERIC COMPLEX PO) Take by mouth.   vitamin B-12 (CYANOCOBALAMIN) 1000 MCG tablet Take 1,000 mcg by mouth daily.   No facility-administered encounter medications on file as of 06/29/2022.    Objective:   PHYSICAL EXAMINATION:    VITALS:   Vitals:   06/29/22 1059  BP: (!) 142/90  Pulse: 61  SpO2: 97%  Weight: 201 lb 9.6 oz (91.4 kg)  Height: 6\' 3"  (1.905 m)     GEN:  The patient appears stated age and is in NAD. HEENT:  Normocephalic, atraumatic.  The mucous membranes are moist.  CV:  RRR Lungs:  CTAB Neck:  no bruits  Pt seen 2 hours into levodopa dosing.    Neurological examination:  Orientation: The patient is alert and oriented x3. Cranial nerves: There is good facial symmetry without facial hypomimia. The speech is fluent and clear. Soft palate rises symmetrically and there is no tongue deviation. Hearing is intact to conversational tone. Sensation: Sensation is intact to light touch throughout Motor: Strength is at least antigravity x4.  Movement examination: Tone: There is nl tone in the UE/LE Abnormal movements: there is LUE rest tremor (mild and intermittent) and more rare RUE rest tremor Coordination:  There is no decremation, with any form of RAMS, including alternating supination and pronation of the forearm, hand opening and closing, finger taps, heel  taps and toe taps.  Gait and Station: The patient has no difficulty arising out of a deep-seated chair without the use of the hands. The patient's stride length is good with good arm swing today.  He has a neg pull test  I have reviewed and interpreted the following labs independently    Chemistry      Component Value Date/Time  NA 140 06/09/2022 0759   K 3.8 06/09/2022 0759   CL 104 06/09/2022 0759   CO2 27 06/09/2022 0759   BUN 16 06/09/2022 0759   CREATININE 0.80 06/09/2022 0759   CREATININE 0.92 12/05/2019 0901      Component Value Date/Time   CALCIUM 9.1 06/09/2022 0759   ALKPHOS 61 06/09/2022 0759   AST 13 06/09/2022 0759   ALT 3 06/09/2022 0759   BILITOT 1.1 06/09/2022 0759       Lab Results  Component Value Date   WBC 5.7 06/09/2022   HGB 14.8 06/09/2022   HCT 43.9 06/09/2022   MCV 94.9 06/09/2022   PLT 210.0 06/09/2022    Lab Results  Component Value Date   TSH 0.99 05/09/2017     Total time spent on today's visit was 60  minutes, including both face-to-face time and nonface-to-face time.  Time included that spent on review of records (prior notes available to me/labs/imaging if pertinent), discussing treatment and goals, answering patient's questions and coordinating care.  Cc:  Shelva Majestic, MD

## 2022-06-29 ENCOUNTER — Ambulatory Visit: Payer: Medicare HMO | Admitting: Neurology

## 2022-06-29 ENCOUNTER — Other Ambulatory Visit: Payer: Self-pay

## 2022-06-29 ENCOUNTER — Encounter: Payer: Self-pay | Admitting: Neurology

## 2022-06-29 VITALS — BP 142/90 | HR 61 | Ht 75.0 in | Wt 201.6 lb

## 2022-06-29 DIAGNOSIS — G20A2 Parkinson's disease without dyskinesia, with fluctuations: Secondary | ICD-10-CM

## 2022-06-29 MED ORDER — OPICAPONE 50 MG PO CAPS: 1.0000 | ORAL_CAPSULE | Freq: Every day | ORAL | 0 refills | Status: DC

## 2022-06-29 MED ORDER — OPICAPONE 50 MG PO CAPS
1.0000 | ORAL_CAPSULE | Freq: Every day | ORAL | 0 refills | Status: DC
Start: 2022-06-29 — End: 2022-07-05

## 2022-06-29 NOTE — Patient Instructions (Addendum)
STOP entacapone START opicapone, 50 mg at bedtime  It was good to see you today!    Local and Online Resources for Power over Parkinson's Group  June 2024   LOCAL [place] PARKINSON'S GROUPS   Power over Parkinson's Group:    Power Over Parkinson's Patient Education Group will be Wednesday, JULY 10th-*PLEASE NOTE:  We will not be having a June meeting.  Our next meeting will be in July.  We apologize for the inconvenience.   Power over [employer] and Care Partner Groups will meet together, with plans for separate break out session for caregivers, depending on topic/speaker Upcoming Power over Parkinson's Meetings/Care Partner Support:  2nd Wednesdays of the month at 2 pm:   No regular June meeting, July 10th  Contact Amy Marriott at [email or web address] or Lynwood Dawley at [email or web address] if interested in participating in this group    LOCAL EVENTS AND NEW OFFERINGS  PLEASE NOTE:  There will be no June Power over [employer] meeting [facility] June 12th) Picnic Party for [employer].  [place] [facility] Neurology Movement Disorders Program Celebration.  June 19th 1-3 pm.  Contact Lynwood Dawley at [email or web address] Parkinson's Social Game Night.  First Thursday of each month, 2:00-4:00 pm.  *Next date is June 6th*.  Rossie Muskrat [employer], [employer].  Contact [email or web address] if interested. Parkinson's CarePartner Group for Men is in the works, if interested email [email or web address] ACT FITNESS Chair Yoga classes "Train and Gain", Fridays 10 am, [employer].  Contact Gina at [phone].  Health visitor Classes offering at [employer]!  TUESDAYS (Chair Yoga)  and Wednesdays (PWR! Moves)  1:00 pm.   Contact Synetta Shadow at  [email or web address]  or [phone]  Antoine Poche! Moves classes.  Thursdays at 11:45, [facility].  Free to participate through  [employer].  Contact Lynwood Dawley at [email or web address] or [phone] to register Drumming for Parkinson's will be held on 2nd and 4th Mondays at 11:00 am.   Located at the [place] of the [employer] ([address] [place]  Contact Albertina Parr at [email or web address] or [phone]  [facility] Parkinson's Tai Chi Class, Mondays at 11 am.  Call [phone] for details   [facility] EDUCATION AND SUPPORT  Parkinson Foundation:  www.parkinson.org  PD Health at Home continues:  Mindfulness Mondays, Wellness Wednesdays, Fitness Fridays  (PWR! Moves as part of Fitness Fridays March 22nd, 1-1:45 pm) Upcoming Education:   Current and Emerging Methods to Aid Parkinson's Diagnosis.  Wednesday, May 29th, 1-2 pm Recognize and Respond to Parkinson's Psychosis.  Wednesday, June 5th, 1-2 pm Parkinsonisms.  Wednesday, June 12th, 1-2 pm Expert Briefing:  Addressing the Challenge of Apathy in Parkinson's.  Wednesday, September 11th, 1-2 pm. Register for virtual education and Photographer (webinars) at [email or web address].gl Please check out their website to sign up for emails and see their full online offerings     [employer] Foundation:  www.[email or web address]   Third Thursday Webinars:  On the third Thursday of every month at 12 p.m. ET, join our free live webinars to learn about various aspects of living with Parkinson's disease and our work to speed medical breakthroughs.  Upcoming Webinar:  You Want to [employer] for Parkinson's Research: Now What?  Thursday, June 20th at 12 noon. Check out additional information on their website to see their full online offerings    [facility]:  [email or web address]  Upcoming Webinar:  Living Safely with Parkinson's Inside and Outside of your Home.  Thursday, June  13th , 3 pm Series:  Living with Parkinson's Meetup.   Third Thursdays each month, 3 pm  Care  Partner Monthly Meetup.  With Jillene Bucks [place].  First Tuesday of each month, 2 pm  Check out additional information to Live Well Today on their website    Parkinson and Movement Disorders (PMD) Alliance:  www.pmdalliance.org  [employer] Online:  Online Education Events  Sign up for emails, which are sent weekly to give you updates on programming and online offerings    Parkinson's Association of the [place]:  www.parkinsonassociation.org  Information on online support groups, education events, and online exercises including Yoga, Parkinson's exercises and more-LOTS of information on links to PD resources and online events  Virtual Support Group through Parkinson's Association of the [place]; next one is scheduled for Wednesday, June 5th   MOVEMENT AND EXERCISE OPPORTUNITIES  Parkinson's Exercise Class offerings at [employer]. *TUESDAYS* (Chair yoga) and Wednesdays (PWR! Moves)  1:00 pm.   *Please note that PWR! Moves Finger class has ended.  Please consider trying PWR! Moves on Wednesdays at 1 pm at [facility].  Contact Synetta Shadow at [email or web address]    Parkinson's Wellness Recovery (PWR! Moves)  www.[email or web address]  Info on the PWR! Virtual Experience:  You will have access to our expertise?through self-assessment, guided plans that start with the PD-specific fundamentals, educational content, tips, Q&A with an expert, and a growing Engineering geologist of PD-specific pre-recorded and live exercise classes of varying types and intensity - both physical and cognitive! If that is not enough, we offer 1:1 wellness consultations (in-person or virtual) to personalize your PWR! Dance movement psychotherapist.   Parkinson [employer] Fridays:   As part of the PD Health @ Home program, this free video series focuses each week on one aspect of fitness designed to support people living with Parkinson's.? These weekly videos highlight the Parkinson Foundation fitness guidelines for people  with Parkinson's disease.  MenusLocal.com.[email or web address]  Dance for PD website is offering free, live-stream classes throughout the week, as well as links to [employer] of classes:  [email or web address]  Virtual dance and Pilates for Parkinson's classes: Click on the Community Tab> Parkinson's Movement Initiative Tab.  To register for classes and for more information, visit www.[email or web address].co.za and click the "community" tab.   [employer] Parkinson's Cycling Classes   [facility] [employer]:  Thursdays @ Noon-Live classes at [employer] ([employer] at [email or web address]?or [phone])  [facility]: Classes Tuesday, Wednesday and Thursday (contact [place] at [email or web address] ?or [phone])  [facility] [employer]  Varied levels of classes are offered Tuesdays and Thursdays at [employer].   Stretching with Byrd Hesselbach weekly class is also offered for people with Parkinson's  To observe a class or for more information, call [phone] or email Patricia Nettle at [email or web address]   ADDITIONAL SUPPORT AND RESOURCES  [employer]:  Online Caregiver Education Opportunities:  www.well-springsolutions.org/caregiver-education/caregiver-support-group.  You may also contact Loleta Chance at [facility] -spring.[email or web address] or [phone].     Well-Spring Navigator:  Just1Navigator program, a?free service to help individuals and families through the journey of determining care for older adults.  The "Navigator" is a Child psychotherapist, Sidney Ace, who will speak with a prospective client and/or loved ones to provide an assessment of the situation and a set of recommendations for a personalized care plan -- all free of charge, and whether?Well[employer]Spring [employer] offers the needed service or not. If the need is not a service we provide, we are well-connected with reputable programs in town that we can  refer you to.  www.well-springsolutions.org or to speak with the Navigator, call [phone].

## 2022-06-30 ENCOUNTER — Telehealth: Payer: Self-pay | Admitting: Neurology

## 2022-06-30 NOTE — Telephone Encounter (Signed)
Left message on the Vm wanting to speak to [facility]

## 2022-06-30 NOTE — Telephone Encounter (Signed)
Called patient and aNSWERED QUESTIONS REGARDING FILLING HIS MEDICATION

## 2022-07-04 DIAGNOSIS — C61 Malignant neoplasm of prostate: Secondary | ICD-10-CM | POA: Diagnosis not present

## 2022-07-04 DIAGNOSIS — Z803 Family history of malignant neoplasm of breast: Secondary | ICD-10-CM | POA: Diagnosis not present

## 2022-07-04 DIAGNOSIS — Z79899 Other long term (current) drug therapy: Secondary | ICD-10-CM | POA: Diagnosis not present

## 2022-07-04 DIAGNOSIS — Z9079 Acquired absence of other genital organ(s): Secondary | ICD-10-CM | POA: Diagnosis not present

## 2022-07-04 DIAGNOSIS — N400 Enlarged prostate without lower urinary tract symptoms: Secondary | ICD-10-CM | POA: Diagnosis not present

## 2022-07-05 ENCOUNTER — Other Ambulatory Visit: Payer: Self-pay

## 2022-07-05 DIAGNOSIS — G20A2 Parkinson's disease without dyskinesia, with fluctuations: Secondary | ICD-10-CM

## 2022-07-05 MED ORDER — OPICAPONE 50 MG PO CAPS
1.0000 | ORAL_CAPSULE | Freq: Every day | ORAL | 0 refills | Status: DC
Start: 2022-07-05 — End: 2022-09-19

## 2022-07-08 ENCOUNTER — Other Ambulatory Visit: Payer: Self-pay | Admitting: Neurology

## 2022-07-14 ENCOUNTER — Ambulatory Visit: Payer: Medicare HMO | Attending: Neurology | Admitting: Physical Therapy

## 2022-07-19 ENCOUNTER — Ambulatory Visit (INDEPENDENT_AMBULATORY_CARE_PROVIDER_SITE_OTHER): Payer: Medicare HMO | Admitting: Psychology

## 2022-07-19 ENCOUNTER — Encounter: Payer: Self-pay | Admitting: Neurology

## 2022-07-19 DIAGNOSIS — F4321 Adjustment disorder with depressed mood: Secondary | ICD-10-CM

## 2022-07-19 NOTE — Progress Notes (Signed)
[facility] Counselor Initial Adult Exam  Name: Dan [name] Date: 07/19/2022 MRN: [id number] DOB: 1955-03-17 PCP: Shelva Majestic, MD  Time spent: 45 mins  Guardian/Payee:  Pt   Paperwork requested: No   Reason for Visit /Presenting Problem: Pt presents for session in person, granting consent for the session.  Mental Status Exam: Appearance:   Casual     Behavior:  Appropriate  Motor:  Shuffling Gait  Speech/Language:   Clear and Coherent  Affect:  Appropriate  Mood:  Some depression; related to health issues  Thought process:  normal  Thought content:    WNL  Sensory/Perceptual disturbances:    WNL  Orientation:  oriented to person, place, and time/date  Attention:  Good  Concentration:  Good  Memory:  WNL  Fund of knowledge:   Good  Insight:    Good  Judgment:   Good  Impulse Control:  Good   Reported Symptoms:  Pt states he has been recently diagnosed with prostate cancer and his father died from prostate cancer in Aug 21, 2013.  He also has Parkinson's Disease.  Pt is involved with a partner named [name]; they have been together for 12 yrs.    Risk Assessment: Danger to Self:  No Self-injurious Behavior: No Danger to Others: No Duty to Warn:no Physical Aggression / Violence:No  Access to Firearms a concern: No  Gang Involvement:No  Patient / guardian was educated about steps to take if suicide or homicide risk level increases between visits: n/a While future psychiatric events cannot be accurately predicted, the patient does not currently require acute inpatient psychiatric care and does not currently meet [facility] involuntary commitment criteria.  Substance Abuse History: Current substance abuse: No     Past Psychiatric History:   Previous psychological history is significant for depression Outpatient Providers:Several outpatient therapists in [place] History of Psych Hospitalization: No  Psychological Testing:  none    Abuse History:  Victim  of: No.,  none    Report needed: No. Victim of Neglect:No. Perpetrator of  none   Witness / Exposure to Domestic Violence: No   Protective Services Involvement: No  Witness to [employer] Violence:  No   Family History:  Family History  Problem Relation Age of Onset   Hypertension Mother    Breast cancer Mother    Ovarian cancer Mother        in her 40s   Prostate cancer Father        complications from radioactive seeds, colostomy for last 10 years    Living situation: the patient lives [place]; in same town home community; they are involved with his parents Drenda Freeze and Rosanne Ashing) and his mom.  Pt is close to his sister, [name] (64 yo--lives in [place]); "my brothers [name] and Kathlene November) are not supportive; [name] is self absorbed and is not helpful with their mom.  I think [name] might be bi-polar."  Pt shares that he and all of his siblings are gay; all are in supportive relationships except for Michael.  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  Stressors: Health problems    Strengths: Supportive Relationships, Family, Friends, Church, [employer], Spirituality, Hopefulness, and Self Advocate  Barriers:  none noted   Legal History: Pending legal issue / charges: The patient has no significant history of legal  issues. History of legal issue / charges:  none  Medical History/Surgical History: reviewed Past Medical History:  Diagnosis Date   Allergy    seasonal   Anxiety    Bleeding hemorrhoid 05/24/2010   Depression 08/11/2006   Anxiety as well around down market- tough time with job loss in [place]     Hyperlipidemia LDL goal < 130 05/24/2010   Irritable bowel syndrome 08/29/2007   Improved with diet change     Parkinson disease    since 2016    Past Surgical History:  Procedure Laterality Date   APPENDECTOMY     COLONOSCOPY  05-01-00   HERNIA REPAIR  2014   Bil inguinal hernia repair   ROOT CANAL     TONSILLECTOMY  1958    Medications: Current Outpatient Medications  Medication Sig Dispense Refill   Alpha-Lipoic Acid 200 MG TABS Take by mouth daily.      Ascorbic Acid (VITAMIN C) 1000 MG tablet Take 1,000 mg by mouth daily. With rose hips     B Complex Vitamins (VITAMIN B COMPLEX PO) Take by mouth daily.      carbidopa-levodopa (SINEMET IR) 25-100 MG tablet Take 2 pills at 6am/8:30/10:30/1pm/4pm May take 1 PRN daily if needed 915 tablet 0   cholecalciferol (VITAMIN D) 1000 units tablet Take 1,000 Units by mouth daily. Vit d 3- Take 125 mcg daily     FOLIC ACID PO Take 400 mg by mouth.      glucosamine-chondroitin 500-400 MG tablet Take 1 tablet by mouth daily.      L-Lysine 500 MG TABS Take 1,000 mg by mouth daily.      Multiple Vitamin (MULTIVITAMIN) tablet Take 1 tablet by mouth daily.     Omega-3 Fatty Acids (FISH OIL) 1000 MG CAPS Take 1,000 mg by mouth daily.     Opicapone 50 MG CAPS Take 1 capsule by mouth at bedtime. 21 capsule 0   Opicapone 50 MG CAPS Take 1 tablet by mouth at bedtime. 180 capsule 0   rOPINIRole (REQUIP) 1 MG tablet TAKE ONE TABLET BY MOUTH THREE TIMES A DAY 270 tablet 0   saw palmetto 160 MG capsule Take 450 mg by mouth daily.      traZODone (DESYREL) 50 MG tablet Take 1-1.5 tablets (50-75 mg total) by mouth at bedtime as needed for sleep. 45  tablet 5   Turmeric (QC TUMERIC COMPLEX PO) Take by mouth.     vitamin B-12 (CYANOCOBALAMIN) 1000 MCG tablet Take 1,000 mcg by mouth daily.     No current facility-administered medications for this visit.    Allergies  Allergen Reactions   Amoxicillin Other (See Comments)    Severe fatigue    Diagnoses:  Adjustment disorder with depressed mood  Plan of Care: Encouraged pt to continue with his self care activities and we will meet next week for a follow up session.     Karie Kirks, [facility]

## 2022-07-22 MED ORDER — ROPINIROLE HCL 2 MG PO TABS: 2.0000 mg | ORAL_TABLET | Freq: Three times a day (TID) | ORAL | 0 refills | Status: DC

## 2022-07-22 NOTE — Telephone Encounter (Signed)
Chelsea,  Have him d/c the ongentys.  Have him do the following: Requip 1mg  in the AM, 1 mg in the PM, 2 mg in the evening x 1 month Requip (ropinirole), 1 mg in the AM, 2 in the afternoon, 2 mg in the evening x 1 month Requip, 2 mg three times per day thereafter  [facility] should [facility] to the time he is done with titration and Dr. Rubin Payor can give him further recommendations.  I have sent the new RX to [facility].  Wish him the best.

## 2022-07-24 ENCOUNTER — Other Ambulatory Visit (HOSPITAL_COMMUNITY): Payer: Self-pay

## 2022-07-25 ENCOUNTER — Telehealth: Payer: Self-pay | Admitting: Neurology

## 2022-07-25 NOTE — Telephone Encounter (Signed)
Rilan wants chelsea to give him a call. He left message with AN to call him back

## 2022-07-25 NOTE — Telephone Encounter (Signed)
Patient called wanting to know does the 2mg  replace the 1mg  or does he take them both? /KB

## 2022-07-26 ENCOUNTER — Ambulatory Visit: Payer: Medicare HMO | Admitting: Psychology

## 2022-07-26 DIAGNOSIS — F4321 Adjustment disorder with depressed mood: Secondary | ICD-10-CM

## 2022-07-26 NOTE — Telephone Encounter (Signed)
Called pateint and went over titration schedule

## 2022-07-26 NOTE — Progress Notes (Addendum)
[facility] Counselor/Therapist Progress Note  Patient ID: Dan [name], MRN: [id number],    Date: 07/26/2022  Time Spent:   45 mins    start time: 1000       end time: 1045  Treatment Type: Individual Therapy  Reported Symptoms: Pt presents in person in the office, granting consent for the session.  Mental Status Exam: Appearance:  Casual     Behavior: Appropriate  Motor: Normal  Speech/Language:  Clear and Coherent  Affect: Appropriate  Mood: normal  Thought process: normal  Thought content:   WNL  Sensory/Perceptual disturbances:   WNL  Orientation: oriented to person, place, and time/date  Attention: Good  Concentration: Good  Memory: WNL  Fund of knowledge:  Good  Insight:   Good  Judgment:  Good  Impulse Control: Good   Risk Assessment: Danger to Self:  No Self-injurious Behavior: No Danger to Others: No Duty to Warn:no Physical Aggression / Violence:No  Access to Firearms a concern: No  Gang Involvement:No   Subjective: Pt shares that he has been good since our initial session.  He mentions that he has been bouyed by the recent political developments and he likes the feelings that he gets from those.  Pt mentions that his mother is in her 63's and has battled cancer twice (breast cancer in her 51's and ovarian cancer in her 45's).  Pt gets strength from helping his mom through these situations.  He also shares that he is leaning toward having radiation treatment for his prostate cancer, rather than surgery.  He is going on Friday to [name] for an interdisciplinary eval; his friend [name] is driving him and [name] is going with him as well.  Pt shares that he told [name] about our initial session and pt felt really good about the session; "[name] even told me and our friends that he might benefit from seeing a therapist as well."  Pt was pleased by this perspective.  Pt shares that he felt very good about our initial session.  Pt shares that he has an appt with a  new neurologist at [facility] soon; he is looking for a physician that will be a better fit for him than his current physician.  He has also met recently with the new minister at their church ([employer] [address] [employer] [place] in [place]).  He intends to become a member of the church on 8/4.   Pt shares that he is building his network of support for himself at this time (therapy, new neurologist, pastor, friends, PCP, urologist, etc.).  He is planning to go to Parkinson's exercise class at [facility] and is looking forward to that.  Encouraged pt to continue with his self care activities and we will meet next week for a follow up session.  Interventions: Cognitive Behavioral Therapy  Diagnosis:Adjustment disorder with depressed mood  Plan: Treatment Plan Strengths/Abilities:  Intelligent, Intuitive, Willing to participate in therapy Treatment Preferences:  Outpatient Individual Therapy Statement of Needs:  Patient is to use CBT, mindfulness and coping skills to help manage and/or decrease symptoms associated with their diagnosis. Symptoms:  Depressed/Irritable mood, worry, social withdrawal Problems Addressed:  Depressive thoughts, Sadness, Sleep issues, etc. Long Term Goals:  Pt to reduce overall level, frequency, and intensity of the feelings of depression as evidenced by decreased irritability, negative self talk, and helpless feelings from 6 to 7 days/week to 0 to 1 days/week, per client report, for at least 3 consecutive months.  Progress: 30% Short Term Goals:  Pt to verbally express understanding of the relationship between feelings of depression and their impact on thinking patterns and behaviors.  Pt to verbalize an understanding of the role that distorted thinking plays in creating fears, excessive worry, and ruminations.  Progress: 30% Target Date:  07/26/2023 Frequency:  Bi-weekly Modality:  Cognitive Behavioral Therapy Interventions by Therapist:  Therapist will use CBT, Mindfulness  exercises, Coping skills and Referrals, as needed by client. Client has verbally approved this treatment plan.  Karie Kirks, [facility]

## 2022-07-27 DIAGNOSIS — C61 Malignant neoplasm of prostate: Secondary | ICD-10-CM | POA: Diagnosis not present

## 2022-07-29 DIAGNOSIS — G20A1 Parkinson's disease without dyskinesia, without mention of fluctuations: Secondary | ICD-10-CM | POA: Diagnosis not present

## 2022-07-29 DIAGNOSIS — C61 Malignant neoplasm of prostate: Secondary | ICD-10-CM | POA: Diagnosis not present

## 2022-08-02 ENCOUNTER — Ambulatory Visit: Payer: Medicare HMO | Admitting: Psychology

## 2022-08-02 ENCOUNTER — Encounter: Payer: Self-pay | Admitting: Neurology

## 2022-08-02 DIAGNOSIS — F4321 Adjustment disorder with depressed mood: Secondary | ICD-10-CM | POA: Diagnosis not present

## 2022-08-02 DIAGNOSIS — L720 Epidermal cyst: Secondary | ICD-10-CM | POA: Diagnosis not present

## 2022-08-02 DIAGNOSIS — L918 Other hypertrophic disorders of the skin: Secondary | ICD-10-CM | POA: Diagnosis not present

## 2022-08-02 NOTE — Progress Notes (Signed)
[facility] [facility] Counselor/Therapist Progress Note  Patient ID: Dan [name], MRN: [id number],    Date: 08/02/2022  Time Spent:   45 mins    start time: 1000       end time: 1045  Treatment Type: Individual Therapy  Reported Symptoms: Pt presents in person in the office, granting consent for the session.  Mental Status Exam: Appearance:  Casual     Behavior: Appropriate  Motor: Normal  Speech/Language:  Clear and Coherent  Affect: Appropriate  Mood: normal  Thought process: normal  Thought content:   WNL  Sensory/Perceptual disturbances:   WNL  Orientation: oriented to person, place, and time/date  Attention: Good  Concentration: Good  Memory: WNL  Fund of knowledge:  Good  Insight:   Good  Judgment:  Good  Impulse Control: Good   Risk Assessment: Danger to Self:  No Self-injurious Behavior: No Danger to Others: No Duty to Warn:no Physical Aggression / Violence:No  Access to Firearms a concern: No  Gang Involvement:No   Subjective: Pt shares that he has been good since our last session.  "Since our last session, I have been to [facility] and had an inter-disciplinary team meeting with three individual physicians.  They were so amazing and so helpful; they suggested 5 radiation treatments instead of the 26 treatments that [facility] told me I would need.  The treatments at [facility] will start in October.  I have also been able to investigate transitioning my Neurology care to [facility] as well."  Pt shares that he has a Parkinson's exercise class and a dermatology appt this afternoon; lots of health care going on for pt at this time.  Pt tells a couple of different stories about how his friends in his life are beneficial for him and how appreciated he is of them.  Pt called the [name] dept to request a transition of care appt with them and is waiting to hear back from them.  He is excited about what they might be able to do to help him.  Pt shares that he has talked with his  sister and she is coming to visit pt and their mom in August and is willing to come back while pt is having his radiation treatment in October.  Pt shares his friend, [name], whose husband was treated for prostate cancer at [facility] is willing to take pt to and from all of his treatments there.  Pt shares that he feels good about taking charge of his treatment options for his prostate cancer and his Parkinson's disease.  Encouraged pt to continue with his self care activities and we will meet next week for a follow up session.  Interventions: Cognitive Behavioral Therapy  Diagnosis:Adjustment disorder with depressed mood  Plan: Treatment Plan Strengths/Abilities:  Intelligent, Intuitive, Willing to participate in therapy Treatment Preferences:  Outpatient Individual Therapy Statement of Needs:  Patient is to use CBT, mindfulness and coping skills to help manage and/or decrease symptoms associated with their diagnosis. Symptoms:  Depressed/Irritable mood, worry, social withdrawal Problems Addressed:  Depressive thoughts, Sadness, Sleep issues, etc. Long Term Goals:  Pt to reduce overall level, frequency, and intensity of the feelings of depression as evidenced by decreased irritability, negative self talk, and helpless feelings from 6 to 7 days/week to 0 to 1 days/week, per client report, for at least 3 consecutive months.  Progress: 30% Short Term Goals:  Pt to verbally express understanding of the relationship between feelings of depression and their impact on thinking patterns  and behaviors.  Pt to verbalize an understanding of the role that distorted thinking plays in creating fears, excessive worry, and ruminations.  Progress: 30% Target Date:  07/26/2023 Frequency:  Bi-weekly Modality:  Cognitive Behavioral Therapy Interventions by Therapist:  Therapist will use CBT, Mindfulness exercises, Coping skills and Referrals, as needed by client. Client has verbally approved this treatment plan.  Karie Kirks, [facility]               Karie Kirks, [facility]

## 2022-08-04 ENCOUNTER — Telehealth: Payer: Self-pay | Admitting: Neurology

## 2022-08-04 NOTE — Telephone Encounter (Signed)
Patient dismissed from Kerin Salen, DO medical care. This is based purely on your decision to transfer care 08/02/22 [facility]

## 2022-08-09 ENCOUNTER — Ambulatory Visit: Payer: Medicare HMO | Admitting: Psychology

## 2022-08-09 DIAGNOSIS — F4321 Adjustment disorder with depressed mood: Secondary | ICD-10-CM | POA: Diagnosis not present

## 2022-08-09 NOTE — Progress Notes (Signed)
[place] [facility] Counselor/Therapist Progress Note  Patient ID: Dan [name], MRN: [id number],    Date: 08/09/2022  Time Spent:   45 mins    start time: 1100       end time: 1145  Treatment Type: Individual Therapy  Reported Symptoms: Pt presents in person in the office, granting consent for the session.  Mental Status Exam: Appearance:  Casual     Behavior: Appropriate  Motor: Normal  Speech/Language:  Clear and Coherent  Affect: Appropriate  Mood: normal  Thought process: normal  Thought content:   WNL  Sensory/Perceptual disturbances:   WNL  Orientation: oriented to person, place, and time/date  Attention: Good  Concentration: Good  Memory: WNL  Fund of knowledge:  Good  Insight:   Good  Judgment:  Good  Impulse Control: Good   Risk Assessment: Danger to Self:  No Self-injurious Behavior: No Danger to Others: No Duty to Warn:no Physical Aggression / Violence:No  Access to Firearms a concern: No  Gang Involvement:No   Subjective: Pt shares that he has been good since our last session.  "I am frustrated that I still do not have an appointment with [name]; I have left them another message.  My brother Dan [name] is saying that he wants to come and visit our mom again but I will believe it when I see him."  He is talking about coming in Sept and he is having a guest come to visit him in Sept as well (Dan [name] is coming 9/7 to visit for a week); his sister Dan [name]) is coming to stay with him while he is going through his treatment.  Pt shares that he has a full day of prep activities at [facility] on 10/1 and his treatments will start soon after.  Pt shares that his past Sunday was new member acknowledgement and pt was part of that ceremony; it was meaningful for pt as well.  Pt shares that this week is Dan [name]'s 55th birthday and pt is looking forward to celebrating with him.  Pt shares that he continues to go to his yoga class regularly and he enjoys the physical and mental  benefits of it.  Pt shares his mom is now 13 yo and "she is making me crazy; it is like a episode of Seinfeld with her all the time."  Pt is able to appreciate his relationship with his mom.  Dan [name] is coming to visit their mom on 8/15.  Encouraged pt to continue with his self care activities and we will meet next week for a follow up session.  Interventions: Cognitive Behavioral Therapy  Diagnosis:Adjustment disorder with depressed mood  Plan: Treatment Plan Strengths/Abilities:  Intelligent, Intuitive, Willing to participate in therapy Treatment Preferences:  Outpatient Individual Therapy Statement of Needs:  Patient is to use CBT, mindfulness and coping skills to help manage and/or decrease symptoms associated with their diagnosis. Symptoms:  Depressed/Irritable mood, worry, social withdrawal Problems Addressed:  Depressive thoughts, Sadness, Sleep issues, etc. Long Term Goals:  Pt to reduce overall level, frequency, and intensity of the feelings of depression as evidenced by decreased irritability, negative self talk, and helpless feelings from 6 to 7 days/week to 0 to 1 days/week, per client report, for at least 3 consecutive months.  Progress: 30% Short Term Goals:  Pt to verbally express understanding of the relationship between feelings of depression and their impact on thinking patterns and behaviors.  Pt to verbalize an understanding of the role that distorted thinking plays in creating fears,  excessive worry, and ruminations.  Progress: 30% Target Date:  07/26/2023 Frequency:  Bi-weekly Modality:  Cognitive Behavioral Therapy Interventions by Therapist:  Therapist will use CBT, Mindfulness exercises, Coping skills and Referrals, as needed by client. Client has verbally approved this treatment plan.  Karie Kirks, [facility]

## 2022-08-16 ENCOUNTER — Ambulatory Visit: Payer: Medicare HMO | Admitting: Psychology

## 2022-08-16 DIAGNOSIS — F4321 Adjustment disorder with depressed mood: Secondary | ICD-10-CM | POA: Diagnosis not present

## 2022-08-16 NOTE — Progress Notes (Signed)
[place] [facility] Counselor/Therapist Progress Note  Patient ID: Dan [name], MRN: [id number],    Date: 08/16/2022  Time Spent:   45 mins    start time: 1000       end time: 1045  Treatment Type: Individual Therapy  Reported Symptoms: Pt presents in person in the office, granting consent for the session.  Mental Status Exam: Appearance:  Casual     Behavior: Appropriate  Motor: Normal  Speech/Language:  Clear and Coherent  Affect: Appropriate  Mood: normal  Thought process: normal  Thought content:   WNL  Sensory/Perceptual disturbances:   WNL  Orientation: oriented to person, place, and time/date  Attention: Good  Concentration: Good  Memory: WNL  Fund of knowledge:  Good  Insight:   Good  Judgment:  Good  Impulse Control: Good   Risk Assessment: Danger to Self:  No Self-injurious Behavior: No Danger to Others: No Duty to Warn:no Physical Aggression / Violence:No  Access to Firearms a concern: No  Gang Involvement:No   Subjective: Pt shares that he has been good since our last session.  "I am frustrated with my mom right now; my sister [name] is coming on Thursday for a visit.  I am still working on getting an appt with the Neurology dept at [facility] and I had dinner this past weekend with friends and that was a good experience with them.  Pt shows me the letter he got from Dr. Arbutus Leas about him being discharged from the practice because he is looking into transferring his care to [facility].  He is also frustrated about not yet having an appt with [name].  His brother [name] is still saying that he wants to come and visit pt and their mom.  He has given him three different dates he could come but joe has not responded yet.  Pt shares he has been feeling pretty good physically lately.  Pt shares that he and [name] went to dinner with several friends and pt took him to lunch; today is pt's and [name]'s 13th anniversary of their relationship.  Pt is going to his  Parkinson's exercise class later today and is looking forward to that.  Encouraged pt to continue with his self care activities and we will meet next week for a follow up session.  Interventions: Cognitive Behavioral Therapy  Diagnosis:Adjustment disorder with depressed mood  Plan: Treatment Plan Strengths/Abilities:  Intelligent, Intuitive, Willing to participate in therapy Treatment Preferences:  Outpatient Individual Therapy Statement of Needs:  Patient is to use CBT, mindfulness and coping skills to help manage and/or decrease symptoms associated with their diagnosis. Symptoms:  Depressed/Irritable mood, worry, social withdrawal Problems Addressed:  Depressive thoughts, Sadness, Sleep issues, etc. Long Term Goals:  Pt to reduce overall level, frequency, and intensity of the feelings of depression as evidenced by decreased irritability, negative self talk, and helpless feelings from 6 to 7 days/week to 0 to 1 days/week, per client report, for at least 3 consecutive months.  Progress: 30% Short Term Goals:  Pt to verbally express understanding of the relationship between feelings of depression and their impact on thinking patterns and behaviors.  Pt to verbalize an understanding of the role that distorted thinking plays in creating fears, excessive worry, and ruminations.  Progress: 30% Target Date:  07/26/2023 Frequency:  Bi-weekly Modality:  Cognitive Behavioral Therapy Interventions by Therapist:  Therapist will use CBT, Mindfulness exercises, Coping skills and Referrals, as needed by client. Client has verbally approved this treatment plan.  Karie Kirks, [facility]

## 2022-08-17 DIAGNOSIS — C61 Malignant neoplasm of prostate: Secondary | ICD-10-CM | POA: Diagnosis not present

## 2022-08-18 ENCOUNTER — Encounter (INDEPENDENT_AMBULATORY_CARE_PROVIDER_SITE_OTHER): Payer: Self-pay

## 2022-08-23 ENCOUNTER — Ambulatory Visit: Payer: Medicare HMO | Admitting: Psychology

## 2022-08-23 DIAGNOSIS — F4321 Adjustment disorder with depressed mood: Secondary | ICD-10-CM

## 2022-08-23 NOTE — Progress Notes (Signed)
[facility] Counselor/Therapist Progress Note  Patient ID: DOMANICK GRANIERI, MRN: [id number],    Date: 08/23/2022  Time Spent:   45 mins    start time: 1000       end time: 1045  Treatment Type: Individual Therapy  Reported Symptoms: Pt presents in person in the office, granting consent for the session.  Mental Status Exam: Appearance:  Casual     Behavior: Appropriate  Motor: Normal  Speech/Language:  Clear and Coherent  Affect: Appropriate  Mood: normal  Thought process: normal  Thought content:   WNL  Sensory/Perceptual disturbances:   WNL  Orientation: oriented to person, place, and time/date  Attention: Good  Concentration: Good  Memory: WNL  Fund of knowledge:  Good  Insight:   Good  Judgment:  Good  Impulse Control: Good   Risk Assessment: Danger to Self:  No Self-injurious Behavior: No Danger to Others: No Duty to Warn:no Physical Aggression / Violence:No  Access to Firearms a concern: No  Gang Involvement:No   Subjective: Pt shares that he has been good since our last session.  "I got into the [name] clinic (Dr. Aletta Edouard); my first appt is on 8/28 (next week); [name] is going to take me.  I am thrilled that I can be seen that quickly."  It is clear that this is a big load off of pt.  Pt has also gotten good news from the [facility] that he does not have to do the "pre-treatment before I start the radiation.  [name] is coming back to help me during that radiation."  Pt enjoyed visiting with [name] this past week and how helpful she was to pt and their mom.  Pt shares that he cooked dinner for his mom, [name], and [name] Sunday night and they all enjoyed the time together.  [name] was able to notice some decline in their mom on this visit.  Pt shares that most of his self care activities this past week involved most things with [name] and [name]; pt is also reading more this past week, felt more relaxed because [name] took so much  responsibility from him while she was here.  Pt is planning to go to the beach for several days with [name] in Sept and he is looking forward to trip.  He is having a friend ([name]) of his coming to visit him from [place] the week of 9/7 and he is looking forward to his visit.  He is going again today to his Parkinson's exercise class and is looking forward to that time.  Encouraged pt to continue with his self care activities and we will meet next week for a follow up session.  Interventions: Cognitive Behavioral Therapy  Diagnosis:Adjustment disorder with depressed mood  Plan: Treatment Plan Strengths/Abilities:  Intelligent, Intuitive, Willing to participate in therapy Treatment Preferences:  Outpatient Individual Therapy Statement of Needs:  Patient is to use CBT, mindfulness and coping skills to help manage and/or decrease symptoms associated with their diagnosis. Symptoms:  Depressed/Irritable mood, worry, social withdrawal Problems Addressed:  Depressive thoughts, Sadness, Sleep issues, etc. Long Term Goals:  Pt to reduce overall level, frequency, and intensity of the feelings of depression as evidenced by decreased irritability, negative self talk, and helpless feelings from 6 to 7 days/week to 0 to 1 days/week, per client report, for at least 3 consecutive months.  Progress: 30% Short Term Goals:  Pt to verbally express understanding of the relationship between feelings of depression and their  impact on thinking patterns and behaviors.  Pt to verbalize an understanding of the role that distorted thinking plays in creating fears, excessive worry, and ruminations.  Progress: 30% Target Date:  07/26/2023 Frequency:  Bi-weekly Modality:  Cognitive Behavioral Therapy Interventions by Therapist:  Therapist will use CBT, Mindfulness exercises, Coping skills and Referrals, as needed by client. Client has verbally approved this treatment plan.  Karie Kirks, [facility]

## 2022-08-25 DIAGNOSIS — E349 Endocrine disorder, unspecified: Secondary | ICD-10-CM | POA: Diagnosis not present

## 2022-08-25 DIAGNOSIS — C61 Malignant neoplasm of prostate: Secondary | ICD-10-CM | POA: Diagnosis not present

## 2022-08-25 DIAGNOSIS — Z8042 Family history of malignant neoplasm of prostate: Secondary | ICD-10-CM | POA: Diagnosis not present

## 2022-08-25 DIAGNOSIS — G20A1 Parkinson's disease without dyskinesia, without mention of fluctuations: Secondary | ICD-10-CM | POA: Diagnosis not present

## 2022-08-25 DIAGNOSIS — R972 Elevated prostate specific antigen [PSA]: Secondary | ICD-10-CM | POA: Diagnosis not present

## 2022-08-30 ENCOUNTER — Ambulatory Visit: Payer: Medicare HMO | Admitting: Psychology

## 2022-08-30 ENCOUNTER — Other Ambulatory Visit (HOSPITAL_COMMUNITY): Payer: Self-pay

## 2022-08-30 DIAGNOSIS — F4321 Adjustment disorder with depressed mood: Secondary | ICD-10-CM

## 2022-08-30 NOTE — Progress Notes (Signed)
Dan [name]  Patient ID: Dan [name], MRN: [id number],    Date: 08/30/2022  Time Spent:   60 mins    start time: 1000       end time: 1100  Treatment Type: Individual Therapy  Reported Symptoms: Pt presents in person in the office, granting consent for the session.  Mental Status Exam: Appearance:  Casual     Behavior: Appropriate  Motor: Normal  Speech/Language:  Clear and Coherent  Affect: Appropriate  Mood: normal  Thought process: normal  Thought content:   WNL  Sensory/Perceptual disturbances:   WNL  Orientation: oriented to person, place, and time/date  Attention: Good  Concentration: Good  Memory: WNL  Fund of knowledge:  Good  Insight:   Good  Judgment:  Good  Impulse Control: Good   Risk Assessment: Danger to Self:  No Self-injurious Behavior: No Danger to Others: No Duty to Warn:no Physical Aggression / Violence:No  Access to Firearms a concern: No  Gang Involvement:No   Subjective: Pt shares that he has been good since our last session.   "My meds are not working so well yet this morning and that is frustrating.  Tomorrow is the big day; my first appt with Dr. Nedra Hai at [facility] and I am very excited about that appt."  Pt also shares that "things at [facility] are not going well right now; they continue to lose people and we could not have yoga the other day."  Pt shares that his mom is doing about the same.  He knows she is safe but she has been going to bed later and later recently.  Pt shares he saw his urologist last Thursday.  [name] is taking [employer] to his appt tomorrow and they are planning to go to lunch after his appt; he is looking forward to the day.  On 9/7, his friend, joe, from [place] is coming to visit for a week and pt is looking forward to that visit; [name] likes his mom and she is looking forward to seeing him too.   Encouraged pt to continue with his self care activities and we will meet next week for  a follow up session.  Interventions: Cognitive Behavioral Therapy  Diagnosis:Adjustment disorder with depressed mood  Plan: Treatment Plan Strengths/Abilities:  Intelligent, Intuitive, Willing to participate in therapy Treatment Preferences:  Outpatient Individual Therapy Statement of Needs:  Patient is to use CBT, mindfulness and coping skills to help manage and/or decrease symptoms associated with their diagnosis. Symptoms:  Depressed/Irritable mood, worry, social withdrawal Problems Addressed:  Depressive thoughts, Sadness, Sleep issues, etc. Long Term Goals:  Pt to reduce overall level, frequency, and intensity of the feelings of depression as evidenced by decreased irritability, negative self talk, and helpless feelings from 6 to 7 days/week to 0 to 1 days/week, per client report, for at least 3 consecutive months.  Progress: 30% Short Term Goals:  Pt to verbally express understanding of the relationship between feelings of depression and their impact on thinking patterns and behaviors.  Pt to verbalize an understanding of the role that distorted thinking plays in creating fears, excessive worry, and ruminations.  Progress: 30% Target Date:  07/26/2023 Frequency:  Bi-weekly Modality:  Cognitive Behavioral Therapy Interventions by Therapist:  Therapist will use CBT, Mindfulness exercises, Coping skills and Referrals, as needed by client. Client has verbally approved this treatment plan.  Karie Kirks, [facility]

## 2022-08-31 DIAGNOSIS — G20B2 Parkinson's disease with dyskinesia, with fluctuations: Secondary | ICD-10-CM | POA: Diagnosis not present

## 2022-09-06 ENCOUNTER — Ambulatory Visit: Payer: Medicare HMO | Admitting: Psychology

## 2022-09-06 DIAGNOSIS — F4321 Adjustment disorder with depressed mood: Secondary | ICD-10-CM

## 2022-09-06 NOTE — Progress Notes (Signed)
[facility] Counselor/Therapist Progress Note  Patient ID: Dan [name], MRN: [id number],    Date: 09/06/2022  Time Spent:   60 mins    start time: 1100       end time: 1200  Treatment Type: Individual Therapy  Reported Symptoms: Pt presents in person in the office, granting consent for the session.  Mental Status Exam: Appearance:  Casual     Behavior: Appropriate  Motor: Normal  Speech/Language:  Clear and Coherent  Affect: Appropriate  Mood: normal  Thought process: normal  Thought content:   WNL  Sensory/Perceptual disturbances:   WNL  Orientation: oriented to person, place, and time/date  Attention: Good  Concentration: Good  Memory: WNL  Fund of knowledge:  Good  Insight:   Good  Judgment:  Good  Impulse Control: Good   Risk Assessment: Danger to Self:  No Self-injurious Behavior: No Danger to Others: No Duty to Warn:no Physical Aggression / Violence:No  Access to Firearms a concern: No  Gang Involvement:No   Subjective: Pt shares that he has been OK since our last session.   "I had my first visit with Dr. Nedra Hai in the Neurology dept at [facility].  [name] took me and [name] went with [place] unexpectedly at the last moment.  It turned out that [name] and [name] were a bit at odds for the visit.  When I was finally able to talk to [name] this weekend about it and she told me that [name] is jealous of me and that he is very competitive for your time."  Pt shares he was disappointed in the way things played out for the visit and for all of them.  Pt shares his friend [name] is coming into town to see pt this week and pt's birthday is coming next week ([birth year]).  Pt shares that he has not been sleeping well because of this situation between [employer] and he needs good sleep.  Pt shares he is aware that he did not do anything wrong about this situation with [name] and [name] and he is working on letting the circumstance go and moving on from it.  He is planning to see Dr. Nedra Hai again in  3 months.  Encouraged pt to enjoy his visit with [name] next week and encouraged pt to continue with his self care activities and we will meet next week for a follow up session.  Interventions: Cognitive Behavioral Therapy  Diagnosis:Adjustment disorder with depressed mood  Plan: Treatment Plan Strengths/Abilities:  Intelligent, Intuitive, Willing to participate in therapy Treatment Preferences:  Outpatient Individual Therapy Statement of Needs:  Patient is to use CBT, mindfulness and coping skills to help manage and/or decrease symptoms associated with their diagnosis. Symptoms:  Depressed/Irritable mood, worry, social withdrawal Problems Addressed:  Depressive thoughts, Sadness, Sleep issues, etc. Long Term Goals:  Pt to reduce overall level, frequency, and intensity of the feelings of depression as evidenced by decreased irritability, negative self talk, and helpless feelings from 6 to 7 days/week to 0 to 1 days/week, per client report, for at least 3 consecutive months.  Progress: 30% Short Term Goals:  Pt to verbally express understanding of the relationship between feelings of depression and their impact on thinking patterns and behaviors.  Pt to verbalize an understanding of the role that distorted thinking plays in creating fears, excessive worry, and ruminations.  Progress: 30% Target Date:  07/26/2023 Frequency:  Bi-weekly Modality:  Cognitive Behavioral Therapy Interventions by Therapist:  Therapist will use CBT, Mindfulness exercises,  Coping skills and Referrals, as needed by client. Client has verbally approved this treatment plan.  Karie Kirks, [facility]

## 2022-09-13 ENCOUNTER — Ambulatory Visit: Payer: Medicare HMO | Admitting: Psychology

## 2022-09-13 DIAGNOSIS — F4321 Adjustment disorder with depressed mood: Secondary | ICD-10-CM

## 2022-09-13 NOTE — Progress Notes (Signed)
[facility] Counselor/Therapist Progress Note  Patient ID: Dan [name], MRN: [id number],    Date: 09/13/2022  Time Spent:   60 mins    start time: 1000       end time: 1100  Treatment Type: Individual Therapy  Reported Symptoms: Pt presents in person in the office, granting consent for the session.  Mental Status Exam: Appearance:  Casual     Behavior: Appropriate  Motor: Normal  Speech/Language:  Clear and Coherent  Affect: Appropriate  Mood: normal  Thought process: normal  Thought content:   WNL  Sensory/Perceptual disturbances:   WNL  Orientation: oriented to person, place, and time/date  Attention: Good  Concentration: Good  Memory: WNL  Fund of knowledge:  Good  Insight:   Good  Judgment:  Good  Impulse Control: Good   Risk Assessment: Danger to Self:  No Self-injurious Behavior: No Danger to Others: No Duty to Warn:no Physical Aggression / Violence:No  Access to Firearms a concern: No  Gang Involvement:No   Subjective: Pt shares that he has been pretty good since our last session.  "My friend [name] from [place] is in for a visit with me and it has come at a great time given this situation with [name] and [name].  [name] knows both of them and he understands what is going on with them.  I am glad he is here."  Pt shares that he saw his mom yesterday (sees her most days) and he helped her get a shower and to wash her hair.  He and [name] are having good conversations since he got to [place]; [name] is enjoying his stay at the [employer].  Pt shares that [name] is coming back to visit pt in October when he has his radiation treatments so she can care for their mom while he is having treatments.  Pt shares that he does not have his Monday yoga class anymore because they do not have an instructor for it and he misses not having it.  Going to [name] is beneficial for pt and he enjoys them.  Pt goes back to [name] for his radiation evaluation on 10/1 and will start his  treatments a week to 10 days after that.  Encouraged pt to continue with his self care activities and we will meet next week for a follow up session.  Interventions: Cognitive Behavioral Therapy  Diagnosis:Adjustment disorder with depressed mood  Plan: Treatment Plan Strengths/Abilities:  Intelligent, Intuitive, Willing to participate in therapy Treatment Preferences:  Outpatient Individual Therapy Statement of Needs:  Patient is to use CBT, mindfulness and coping skills to help manage and/or decrease symptoms associated with their diagnosis. Symptoms:  Depressed/Irritable mood, worry, social withdrawal Problems Addressed:  Depressive thoughts, Sadness, Sleep issues, etc. Long Term Goals:  Pt to reduce overall level, frequency, and intensity of the feelings of depression as evidenced by decreased irritability, negative self talk, and helpless feelings from 6 to 7 days/week to 0 to 1 days/week, per client report, for at least 3 consecutive months.  Progress: 30% Short Term Goals:  Pt to verbally express understanding of the relationship between feelings of depression and their impact on thinking patterns and behaviors.  Pt to verbalize an understanding of the role that distorted thinking plays in creating fears, excessive worry, and ruminations.  Progress: 30% Target Date:  07/26/2023 Frequency:  Bi-weekly Modality:  Cognitive Behavioral Therapy Interventions by Therapist:  Therapist will use CBT, Mindfulness exercises, Coping skills and Referrals, as needed by client. Client  has verbally approved this treatment plan.  Karie Kirks, [facility]

## 2022-09-19 ENCOUNTER — Ambulatory Visit (INDEPENDENT_AMBULATORY_CARE_PROVIDER_SITE_OTHER): Payer: Medicare HMO

## 2022-09-19 VITALS — BP 110/74 | HR 71 | Temp 97.3°F | Wt 201.6 lb

## 2022-09-19 DIAGNOSIS — Z0000 Encounter for general adult medical examination without abnormal findings: Secondary | ICD-10-CM

## 2022-09-19 DIAGNOSIS — Z1211 Encounter for screening for malignant neoplasm of colon: Secondary | ICD-10-CM | POA: Diagnosis not present

## 2022-09-19 DIAGNOSIS — Z23 Encounter for immunization: Secondary | ICD-10-CM | POA: Diagnosis not present

## 2022-09-19 DIAGNOSIS — Z86010 Personal history of colonic polyps: Secondary | ICD-10-CM | POA: Diagnosis not present

## 2022-09-19 NOTE — Progress Notes (Signed)
Subjective:   Dan [name] is a 67 y.o. male who presents for Medicare Annual/Subsequent preventive examination.  Visit Complete: In person  Cardiac Risk Factors include: advanced age (>62men, >40 women);dyslipidemia;male gender     Objective:    Today's Vitals   09/19/22 1025  BP: 110/74  Pulse: 71  Temp: (!) 97.3 F (36.3 C)  SpO2: 93%  Weight: 201 lb 9.6 oz (91.4 kg)   Body mass index is 25.2 kg/m.     09/19/2022   10:37 AM 06/29/2022   10:58 AM 12/08/2021   11:06 AM 11/11/2021    2:02 PM 09/17/2021    8:55 AM 06/28/2021    8:10 AM 04/22/2021    8:51 AM  Advanced Directives  Does Patient Have a Medical Advance Directive? Yes Yes No Yes Yes Yes Yes  Type of Estate agent of [place];Living will Living will  Healthcare Power of [place];Living will Healthcare Power of [place];Living will Healthcare Power of [place];Living will;Out of facility DNR (pink MOST or yellow form) Healthcare Power of [place];Living will  Does patient want to make changes to medical advance directive?    No - Patient declined   No - Patient declined  Copy of Healthcare Power of Attorney in Chart? No - copy requested    No - copy requested      Current Medications (verified) Outpatient Encounter Medications as of 09/19/2022  Medication Sig   Alpha-Lipoic Acid 200 MG TABS Take by mouth daily.    Ascorbic Acid (VITAMIN C) 1000 MG tablet Take 1,000 mg by mouth daily. With rose hips   B Complex Vitamins (VITAMIN B COMPLEX PO) Take by mouth daily.    carbidopa-levodopa (SINEMET IR) 25-100 MG tablet Take 2 pills at 6am/8:30/10:30/1pm/4pm May take 1 PRN daily if needed   cholecalciferol (VITAMIN D) 1000 units tablet Take 1,000 Units by mouth daily. Vit d 3- Take 125 mcg daily   FOLIC ACID PO Take 400 mg by mouth.    glucosamine-chondroitin 500-400 MG tablet Take 1 tablet by mouth daily.    L-Lysine 500 MG TABS Take 1,000 mg by mouth daily.    Multiple Vitamin (MULTIVITAMIN)  tablet Take 1 tablet by mouth daily.   Omega-3 Fatty Acids (FISH OIL) 1000 MG CAPS Take 1,000 mg by mouth daily.   rOPINIRole (REQUIP) 2 MG tablet Take 1 tablet (2 mg total) by mouth 3 (three) times daily.   saw palmetto 160 MG capsule Take 450 mg by mouth daily.    traZODone (DESYREL) 50 MG tablet Take 1-1.5 tablets (50-75 mg total) by mouth at bedtime as needed for sleep.   Turmeric (QC TUMERIC COMPLEX PO) Take by mouth.   vitamin B-12 (CYANOCOBALAMIN) 1000 MCG tablet Take 1,000 mcg by mouth daily.   [DISCONTINUED] Opicapone 50 MG CAPS Take 1 capsule by mouth at bedtime.   [DISCONTINUED] Opicapone 50 MG CAPS Take 1 tablet by mouth at bedtime.   No facility-administered encounter medications on file as of 09/19/2022.    Allergies (verified) Amoxicillin   History: Past Medical History:  Diagnosis Date   Allergy    seasonal   Anxiety    Bleeding hemorrhoid 05/24/2010   Depression 08/11/2006   Anxiety as well around down market- tough time with job loss in [place]     Hyperlipidemia LDL goal < 130 05/24/2010   Irritable bowel syndrome 08/29/2007   Improved with diet change     Parkinson disease    since 2016   Past Surgical  History:  Procedure Laterality Date   APPENDECTOMY     COLONOSCOPY  05-01-00   HERNIA REPAIR  2014   Bil inguinal hernia repair   ROOT CANAL     TONSILLECTOMY  1958   Family History  Problem Relation Age of Onset   Hypertension Mother    Breast cancer Mother    Ovarian cancer Mother        in her 8s   Prostate cancer Father        complications from radioactive seeds, colostomy for last 10 years   Social History   Socioeconomic History   Marital status: Single    Spouse name: Not on file   Number of children: 0   Years of education: Not on file   Highest education level: Bachelor's degree (e.g., BA, AB, BS)  Occupational History   Occupation: Publishing copy    Comment: works at [employer]  Tobacco Use   Smoking status:  Never   Smokeless tobacco: Never  Vaping Use   Vaping status: Never Used  Substance and Sexual Activity   Alcohol use: Yes    Alcohol/week: 0.0 standard drinks of alcohol    Comment: social   Drug use: No   Sexual activity: Not on file  Other Topics Concern   Not on file  Social History Narrative   Lives alone. Long term partner - does not live with him. Home is two levels      Retired  September 2023 from [employer].    Formerly Development worker, community.    Goes to [employer]      Hobbies: Exercises regularly, relax with friends   Right handed   Caffeine 1 cup daily   Social Determinants of Health   Financial Resource Strain: Low Risk  (09/19/2022)   Overall Financial Resource Strain (CARDIA)    Difficulty of Paying Living Expenses: Not hard at all  Food Insecurity: No Food Insecurity (09/19/2022)   Hunger Vital Sign    Worried About Running Out of Food in the Last Year: Never true    Ran Out of Food in the Last Year: Never true  Transportation Needs: No Transportation Needs (09/19/2022)   PRAPARE - Administrator, Civil Service (Medical): No    Lack of Transportation (Non-Medical): No  Physical Activity: Sufficiently Active (09/19/2022)   Exercise Vital Sign    Days of Exercise per Week: 3 days    Minutes of Exercise per Session: 50 min  Stress: No Stress Concern Present (09/19/2022)   [employer] of Occupational Health - Occupational Stress Questionnaire    Feeling of Stress : Not at all  Social Connections: Moderately Isolated (09/19/2022)   Social Connection and Isolation Panel [NHANES]    Frequency of Communication with Friends and Family: More than three times a week    Frequency of Social Gatherings with Friends and Family: More than three times a week    Attends Religious Services: More than 4 times per year    Active Member of [employer] or Organizations: No    Attends Engineer, structural:  Never    Marital Status: Never married    Tobacco Counseling Counseling given: Not Answered   Clinical Intake:  Pre-visit preparation completed: Yes  Pain : No/denies pain     BMI - recorded: 25.2 Nutritional Status: BMI 25 -29 Overweight Nutritional Risks: None Diabetes: No  How often do you need to have someone help you when  you read instructions, pamphlets, or other written materials from your doctor or pharmacy?: 1 - Never  Interpreter Needed?: No  Information entered by :: Lanier Ensign, LPN   Activities of Daily Living    09/19/2022   10:33 AM  In your present state of health, do you have any difficulty performing the following activities:  Hearing? 0  Vision? 0  Difficulty concentrating or making decisions? 0  Walking or climbing stairs? 0  Dressing or bathing? 0  Doing errands, shopping? 0  Preparing Food and eating ? N  Using the Toilet? N  In the past six months, have you accidently leaked urine? N  Do you have problems with loss of bowel control? N  Managing your Medications? N  Managing your Finances? N  Housekeeping or managing your Housekeeping? N    Patient Care Team: Shelva Majestic, MD as PCP - General (Family Medicine) Tat, Octaviano Batty, DO as Consulting Physician (Neurology) Charlynn Court as Consulting Physician (Dentistry) Dr. Nicki Reaper as Consulting Physician (Chiropractic Medicine)  Indicate any recent Medical Services you may have received from other than [facility] providers in the past year (date may be approximate).     Assessment:   This is a routine wellness examination for Terrik.  Hearing/Vision screen Hearing Screening - Comments:: Pt denies any hearing issues  Vision Screening - Comments:: Pt followed up with [facility] for annual eye exams    Goals Addressed             This Visit's Progress    Patient Stated       Come out cancer free        Depression Screen    09/19/2022   10:38 AM 06/16/2022    9:53 AM 09/17/2021     8:53 AM 06/03/2021   10:18 AM 12/05/2019    8:20 AM 06/07/2019    8:04 AM 05/09/2017    8:54 AM  PHQ 2/9 Scores  PHQ - 2 Score 0 0 0 0 0 0 0  PHQ- 9 Score 0 2  4       Fall Risk    09/19/2022   10:40 AM 06/29/2022   10:59 AM 06/16/2022    9:47 AM 12/08/2021   11:05 AM 09/17/2021    8:58 AM  Fall Risk   Falls in the past year? 0 0 0 0 0  Number falls in past yr: 0 0 0 0 0  Injury with Fall? 0 0 0 0 0  Risk for fall due to : Impaired vision;Impaired balance/gait  No Fall Risks  No Fall Risks;Impaired vision  Risk for fall due to: Comment use cane for safety      Follow up Falls prevention discussed Falls evaluation completed Falls evaluation completed Falls evaluation completed Falls prevention discussed    MEDICARE RISK AT HOME: Medicare Risk at Home Any stairs in or around the home?: Yes If so, are there any without handrails?: No Home free of loose throw rugs in walkways, pet beds, electrical cords, etc?: Yes Adequate lighting in your home to reduce risk of falls?: Yes Life alert?: No Use of a cane, walker or w/c?: Yes Grab bars in the bathroom?: No Shower chair or bench in shower?: Yes Elevated toilet seat or a handicapped toilet?: No  TIMED UP AND GO:  Was the test performed?  Yes  Length of time to ambulate 10 feet: 15 sec Gait steady and fast with assistive device    Cognitive Function:  09/19/2022   10:41 AM 09/17/2021    8:59 AM  6CIT Screen  What Year? 0 points 0 points  What month? 0 points 0 points  What time? 0 points 0 points  Count back from 20 0 points 0 points  Months in reverse 0 points 0 points  Repeat phrase 0 points 0 points  Total Score 0 points 0 points    Immunizations Immunization History  Administered Date(s) Administered   Fluad Quad(high Dose 65+) 08/03/2021   Fluad Trivalent(High Dose 65+) 09/19/2022   Influenza Whole 01/03/2002   Influenza, High Dose Seasonal PF 10/24/2020   Influenza,inj,Quad PF,6+ Mos 08/19/2017, 09/04/2018    Influenza-Unspecified 09/21/2014, 10/08/2015, 08/31/2016, 10/04/2019   PFIZER(Purple Top)SARS-COV-2 Vaccination 03/19/2019, 04/15/2019, 10/15/2019   PNEUMOCOCCAL CONJUGATE-20 06/03/2021   Pfizer Covid-19 Vaccine Bivalent Booster 31yrs & up 10/24/2020   Td 10/03/1996, 11/05/2007   Tdap 11/10/2017   Zoster Recombinant(Shingrix) 05/25/2016, 10/20/2016   Zoster, Live 11/18/2015    TDAP status: Up to date  Flu Vaccine status: Completed at today's visit  Pneumococcal vaccine status: Up to date  Covid-19 vaccine status: Information provided on how to obtain vaccines.   Qualifies for Shingles Vaccine? Yes   Zostavax completed Yes   Shingrix Completed?: Yes  Screening Tests Health Maintenance  Topic Date Due   COVID-19 Vaccine (5 - 2023-24 season) 09/04/2022   Colonoscopy  12/23/2022   Medicare Annual Wellness (AWV)  09/19/2023   DTaP/Tdap/Td (4 - Td or Tdap) 11/11/2027   Pneumonia Vaccine 39+ Years old  Completed   INFLUENZA VACCINE  Completed   Hepatitis C Screening  Completed   Zoster Vaccines- Shingrix  Completed   HPV VACCINES  Aged Out    Health Maintenance  Health Maintenance Due  Topic Date Due   COVID-19 Vaccine (5 - 2023-24 season) 09/04/2022   Colonoscopy  12/23/2022    Colorectal cancer screening: Type of screening: Colonoscopy. Completed 12/22/17. Repeat every 5 years order placed 09/19/22   Additional Screening:  Hepatitis C Screening:  Completed 10/30/14  Vision Screening: Recommended annual ophthalmology exams for early detection of glaucoma and other disorders of the eye. Is the patient up to date with their annual eye exam?  Yes  Who is the provider or what is the name of the office in which the patient attends annual eye exams? [facility]  If pt is not established with a provider, would they like to be referred to a provider to establish care? No .   Dental Screening: Recommended annual dental exams for proper oral hygiene    Community Resource  Referral / Chronic Care Management: CRR required this visit?  No   CCM required this visit?  No     Plan:     I have personally reviewed and noted the following in the patient's chart:   Medical and social history Use of alcohol, tobacco or illicit drugs  Current medications and supplements including opioid prescriptions. Patient is not currently taking opioid prescriptions. Functional ability and status Nutritional status Physical activity Advanced directives List of other physicians Hospitalizations, surgeries, and ER visits in previous 12 months Vitals Screenings to include cognitive, depression, and falls Referrals and appointments  In addition, I have reviewed and discussed with patient certain preventive protocols, quality metrics, and best practice recommendations. A written personalized care plan for preventive services as well as general preventive health recommendations were provided to patient.     Marzella Schlein, LPN   1/61/0960   After Visit Summary: (MyChart) Due to this  being a telephonic visit, the after visit summary with patients personalized plan was offered to patient via MyChart   Nurse Notes: none

## 2022-09-19 NOTE — Patient Instructions (Signed)
Mr. Dan [name] , Thank you for taking time to come for your Medicare Wellness Visit. I appreciate your ongoing commitment to your health goals. Please review the following plan we discussed and let me know if I can assist you in the future.   Referrals/Orders/Follow-Ups/Clinician Recommendations: Pt stated to be cancer free Flu vaccine given 09/19/22 right deltoid   This is a list of the screening recommended for you and due dates:  Health Maintenance  Topic Date Due   Flu Shot  08/04/2022   COVID-19 Vaccine (5 - 2023-24 season) 09/04/2022   Colon Cancer Screening  12/23/2022   Medicare Annual Wellness Visit  09/19/2023   DTaP/Tdap/Td vaccine (4 - Td or Tdap) 11/11/2027   Pneumonia Vaccine  Completed   Hepatitis C Screening  Completed   Zoster (Shingles) Vaccine  Completed   HPV Vaccine  Aged Out    Advanced directives: (Copy Requested) Please bring a copy of your health care power of attorney and living will to the office to be added to your chart at your convenience.  Next Medicare Annual Wellness Visit scheduled for next year: Yes

## 2022-09-20 ENCOUNTER — Ambulatory Visit: Payer: Medicare HMO | Admitting: Psychology

## 2022-09-20 DIAGNOSIS — F4321 Adjustment disorder with depressed mood: Secondary | ICD-10-CM | POA: Diagnosis not present

## 2022-09-20 NOTE — Progress Notes (Signed)
[facility] Counselor/Therapist Progress Note  Patient ID: Dan [name], MRN: [id number],    Date: 09/20/2022  Time Spent:   60 mins    start time: 1000       end time: 1100  Treatment Type: Individual Therapy  Reported Symptoms: Pt presents in person in the office, granting consent for the session.  Mental Status Exam: Appearance:  Casual     Behavior: Appropriate  Motor: Normal  Speech/Language:  Clear and Coherent  Affect: Appropriate  Mood: normal  Thought process: normal  Thought content:   WNL  Sensory/Perceptual disturbances:   WNL  Orientation: oriented to person, place, and time/date  Attention: Good  Concentration: Good  Memory: WNL  Fund of knowledge:  Good  Insight:   Good  Judgment:  Good  Impulse Control: Good   Risk Assessment: Danger to Self:  No Self-injurious Behavior: No Danger to Others: No Duty to Warn:no Physical Aggression / Violence:No  Access to Firearms a concern: No  Gang Involvement:No   Subjective: Pt shares that he has been pretty good since our last session.  "My friend [name] from [place] was in last week and we had a great visit.  He shared with me that he was aware of [name]'s control issues as they relate to me and our relationship."  Pt shares that he and [name] will be at the beach next week so he will miss our session next Tuesday.  They are going to stop in [place] to have lunch with a college friend of [name]'s and pt would rather not spend that time there but [name] wants to visit with her.  Pt cooked dinner for [name] last night "and it was a very good dinner."  Pt shares that [name] is coming back to visit pt in October when he has his radiation treatments so she can care for their mom while he is having treatments.  Going to [name] is beneficial for pt and he enjoys them.  Pt goes back to [name] for his radiation evaluation on 10/1 and will start his treatments a week to 10 days after that.  Encouraged pt to continue with his self care  activities and we will meet next week for a follow up session.  Interventions: Cognitive Behavioral Therapy  Diagnosis:Adjustment disorder with depressed mood  Plan: Treatment Plan Strengths/Abilities:  Intelligent, Intuitive, Willing to participate in therapy Treatment Preferences:  Outpatient Individual Therapy Statement of Needs:  Patient is to use CBT, mindfulness and coping skills to help manage and/or decrease symptoms associated with their diagnosis. Symptoms:  Depressed/Irritable mood, worry, social withdrawal Problems Addressed:  Depressive thoughts, Sadness, Sleep issues, etc. Long Term Goals:  Pt to reduce overall level, frequency, and intensity of the feelings of depression as evidenced by decreased irritability, negative self talk, and helpless feelings from 6 to 7 days/week to 0 to 1 days/week, per client report, for at least 3 consecutive months.  Progress: 30% Short Term Goals:  Pt to verbally express understanding of the relationship between feelings of depression and their impact on thinking patterns and behaviors.  Pt to verbalize an understanding of the role that distorted thinking plays in creating fears, excessive worry, and ruminations.  Progress: 30% Target Date:  07/26/2023 Frequency:  Bi-weekly Modality:  Cognitive Behavioral Therapy Interventions by Therapist:  Therapist will use CBT, Mindfulness exercises, Coping skills and Referrals, as needed by client. Client has verbally approved this treatment plan.  Karie Kirks, [facility]

## 2022-09-27 ENCOUNTER — Ambulatory Visit: Payer: Medicare HMO | Admitting: Psychology

## 2022-09-29 ENCOUNTER — Ambulatory Visit (INDEPENDENT_AMBULATORY_CARE_PROVIDER_SITE_OTHER): Payer: Medicare HMO | Admitting: Psychology

## 2022-09-29 DIAGNOSIS — F4321 Adjustment disorder with depressed mood: Secondary | ICD-10-CM

## 2022-09-29 NOTE — Progress Notes (Signed)
[facility] Counselor/Therapist Progress Note  Patient ID: Dan [name], MRN: [id number],    Date: 09/29/2022  Time Spent:   60 mins    start time: 1100       end time: 1200  Treatment Type: Individual Therapy  Reported Symptoms: Pt presents in person in the office, granting consent for the session.  Mental Status Exam: Appearance:  Casual     Behavior: Appropriate  Motor: Normal  Speech/Language:  Clear and Coherent  Affect: Appropriate  Mood: normal  Thought process: normal  Thought content:   WNL  Sensory/Perceptual disturbances:   WNL  Orientation: oriented to person, place, and time/date  Attention: Good  Concentration: Good  Memory: WNL  Fund of knowledge:  Good  Insight:   Good  Judgment:  Good  Impulse Control: Good   Risk Assessment: Danger to Self:  No Self-injurious Behavior: No Danger to Others: No Duty to Warn:no Physical Aggression / Violence:No  Access to Firearms a concern: No  Gang Involvement:No   Subjective: Pt shares that he and [name] had a nice time at [employer] last week.  Pt shares that they have been to the house they stayed in at the beach several times before so it is familiar to him.  He really noticed his limitations while he was there this time.   Pt shares he is working to be grateful for the good times that he has opportunity to have.  Pt shares that [name] is coming to [place] on 10/14 to help him with his treatments and to care for their mom while he is having his treatments.  Pt shares that he is having some anticipatory anxiety related to his appt at [facility] on Tuesday, 10/1 and we talked about that situation.  Pt also shares that the RN coordinator at [facility] called him this morning and told him that his mom called the nurses at 2am this morning and told them she was having chest pains.  They are taking her to the doctor today at 1pm and pt is going with her to the appt.  Pt is looking forward to seeing [name] and having her  here to help with him and their mom.  He enjoys her willingness to help in this situation.  Going to [name] is beneficial for pt and he enjoys them.  Encouraged pt to continue with his self care activities and we will meet in 2 weeks for a follow up session, due to his appt at [facility] next Tuesday.  Interventions: Cognitive Behavioral Therapy  Diagnosis:Adjustment disorder with depressed mood  Plan: Treatment Plan Strengths/Abilities:  Intelligent, Intuitive, Willing to participate in therapy Treatment Preferences:  Outpatient Individual Therapy Statement of Needs:  Patient is to use CBT, mindfulness and coping skills to help manage and/or decrease symptoms associated with their diagnosis. Symptoms:  Depressed/Irritable mood, worry, social withdrawal Problems Addressed:  Depressive thoughts, Sadness, Sleep issues, etc. Long Term Goals:  Pt to reduce overall level, frequency, and intensity of the feelings of depression as evidenced by decreased irritability, negative self talk, and helpless feelings from 6 to 7 days/week to 0 to 1 days/week, per client report, for at least 3 consecutive months.  Progress: 30% Short Term Goals:  Pt to verbally express understanding of the relationship between feelings of depression and their impact on thinking patterns and behaviors.  Pt to verbalize an understanding of the role that distorted thinking plays in creating fears, excessive worry, and ruminations.  Progress: 30% Target Date:  07/26/2023 Frequency:  Bi-weekly Modality:  Cognitive Behavioral Therapy Interventions by Therapist:  Therapist will use CBT, Mindfulness exercises, Coping skills and Referrals, as needed by client. Client has verbally approved this treatment plan.  Karie Kirks, [facility]

## 2022-10-04 ENCOUNTER — Ambulatory Visit: Payer: Medicare HMO | Admitting: Psychology

## 2022-10-04 DIAGNOSIS — C61 Malignant neoplasm of prostate: Secondary | ICD-10-CM | POA: Diagnosis not present

## 2022-10-11 ENCOUNTER — Ambulatory Visit: Payer: Medicare HMO | Admitting: Psychology

## 2022-10-11 DIAGNOSIS — F4321 Adjustment disorder with depressed mood: Secondary | ICD-10-CM

## 2022-10-11 NOTE — Progress Notes (Signed)
[facility] Counselor/Therapist Progress Note  Patient ID: Dan [name], MRN: [id number],    Date: 10/11/2022  Time Spent:   60 mins    start time: 1000       end time: 1100  Treatment Type: Individual Therapy  Reported Symptoms: Pt presents in person in the office, granting consent for the session.  Mental Status Exam: Appearance:  Casual     Behavior: Appropriate  Motor: Normal  Speech/Language:  Clear and Coherent  Affect: Appropriate  Mood: normal  Thought process: normal  Thought content:   WNL  Sensory/Perceptual disturbances:   WNL  Orientation: oriented to person, place, and time/date  Attention: Good  Concentration: Good  Memory: WNL  Fund of knowledge:  Good  Insight:   Good  Judgment:  Good  Impulse Control: Good   Risk Assessment: Danger to Self:  No Self-injurious Behavior: No Danger to Others: No Duty to Warn:no Physical Aggression / Violence:No  Access to Firearms a concern: No  Gang Involvement:No   Subjective: Pt shares that he had his appt with [name] last week and he was very impressed with their processes and their consideration.  He shares he had a full day at [facility] and felt good about the day; "everything went like it was supposed to go; they did such a good job; it seems that they think of everything."  [name] went with pt to his evaluation and was most helpful to him in this process; she was familiar with where they were going and what would happen with him because her husband had the same treatment at the same facility.  His first treatment is Wednesday, 10/16 at 215pm.  His sister, [name], is coming into town on Tuesday, 10/15 and will be here for 2 wks; pt is excited that she is coming and will be here throughout his treatments.  His friends have been asking him how they can help him during this time as well.  Pt shares that he feels grateful for the life that he has lived; he again shares that he feels content.  Pt shares that his mom  is doing well.  Talked with pt about Tood's glass half empty perspective and pt's feeling of needing to take care of him; encouraged pt to focus the next three weeks on himself and nurturing himself well.  Pt continues to go to [name] is beneficial for pt and he enjoys them.  Encouraged pt to continue with his self care activities and we will meet next week for a follow up session.  Interventions: Cognitive Behavioral Therapy  Diagnosis:Adjustment disorder with depressed mood  Plan: Treatment Plan Strengths/Abilities:  Intelligent, Intuitive, Willing to participate in therapy Treatment Preferences:  Outpatient Individual Therapy Statement of Needs:  Patient is to use CBT, mindfulness and coping skills to help manage and/or decrease symptoms associated with their diagnosis. Symptoms:  Depressed/Irritable mood, worry, social withdrawal Problems Addressed:  Depressive thoughts, Sadness, Sleep issues, etc. Long Term Goals:  Pt to reduce overall level, frequency, and intensity of the feelings of depression as evidenced by decreased irritability, negative self talk, and helpless feelings from 6 to 7 days/week to 0 to 1 days/week, per client report, for at least 3 consecutive months.  Progress: 30% Short Term Goals:  Pt to verbally express understanding of the relationship between feelings of depression and their impact on thinking patterns and behaviors.  Pt to verbalize an understanding of the role that distorted thinking plays in creating fears, excessive worry, and ruminations.  Progress: 30% Target Date:  07/26/2023 Frequency:  Bi-weekly Modality:  Cognitive Behavioral Therapy Interventions by Therapist:  Therapist will use CBT, Mindfulness exercises, Coping skills and Referrals, as needed by client. Client has verbally approved this treatment plan.  Karie Kirks, [facility]

## 2022-10-14 ENCOUNTER — Other Ambulatory Visit: Payer: Self-pay | Admitting: Neurology

## 2022-10-18 ENCOUNTER — Ambulatory Visit: Payer: Medicare HMO | Admitting: Psychology

## 2022-10-18 DIAGNOSIS — C61 Malignant neoplasm of prostate: Secondary | ICD-10-CM | POA: Diagnosis not present

## 2022-10-20 ENCOUNTER — Ambulatory Visit: Payer: Medicare HMO | Admitting: Psychology

## 2022-10-20 DIAGNOSIS — F4321 Adjustment disorder with depressed mood: Secondary | ICD-10-CM | POA: Diagnosis not present

## 2022-10-20 NOTE — Progress Notes (Signed)
[facility] Counselor/Therapist Progress Note  Patient ID: Dan [name], MRN: [id number],    Date: 10/20/2022  Time Spent:   45 mins    start time: 0800       end time: 0845  Treatment Type: Individual Therapy  Reported Symptoms: Pt presents in person in the office, granting consent for the session.  Mental Status Exam: Appearance:  Casual     Behavior: Appropriate  Motor: Normal  Speech/Language:  Clear and Coherent  Affect: Appropriate  Mood: normal  Thought process: normal  Thought content:   WNL  Sensory/Perceptual disturbances:   WNL  Orientation: oriented to person, place, and time/date  Attention: Good  Concentration: Good  Memory: WNL  Fund of knowledge:  Good  Insight:   Good  Judgment:  Good  Impulse Control: Good   Risk Assessment: Danger to Self:  No Self-injurious Behavior: No Danger to Others: No Duty to Warn:no Physical Aggression / Violence:No  Access to Firearms a concern: No  Gang Involvement:No   Subjective: Pt shares that his first session of radiation was supposed to have been yesterday; "[name] called me in advance of my appt and pushed the first appt to Friday; They were having some equipment issues so we are starting tomorrow.  After they called yesterday, I had such a sense of peace that I went to my chair and fell asleep."  Pt is fine with the change of schedule.  Pt shares that [name] is clearly worried about pt and is expressing his worry in ways that are stressful for pt.  Pt shares that he is aware that [name] has control issues; he is an only child and had a helicopter mom his whole life.  Pt knows that he would likely need to have a conversation with [name] about slowing down his concern.  Encouraged pt to focus the next three weeks on himself and nurturing himself well.  Pt continues to go to [name] is beneficial for pt and he enjoys them.  Encouraged pt to continue with his self care activities and we will meet next week for a  follow up session.  Interventions: Cognitive Behavioral Therapy  Diagnosis:Adjustment disorder with depressed mood  Plan: Treatment Plan Strengths/Abilities:  Intelligent, Intuitive, Willing to participate in therapy Treatment Preferences:  Outpatient Individual Therapy Statement of Needs:  Patient is to use CBT, mindfulness and coping skills to help manage and/or decrease symptoms associated with their diagnosis. Symptoms:  Depressed/Irritable mood, worry, social withdrawal Problems Addressed:  Depressive thoughts, Sadness, Sleep issues, etc. Long Term Goals:  Pt to reduce overall level, frequency, and intensity of the feelings of depression as evidenced by decreased irritability, negative self talk, and helpless feelings from 6 to 7 days/week to 0 to 1 days/week, per client report, for at least 3 consecutive months.  Progress: 30% Short Term Goals:  Pt to verbally express understanding of the relationship between feelings of depression and their impact on thinking patterns and behaviors.  Pt to verbalize an understanding of the role that distorted thinking plays in creating fears, excessive worry, and ruminations.  Progress: 30% Target Date:  07/26/2023 Frequency:  Bi-weekly Modality:  Cognitive Behavioral Therapy Interventions by Therapist:  Therapist will use CBT, Mindfulness exercises, Coping skills and Referrals, as needed by client. Client has verbally approved this treatment plan.  Karie Kirks, [facility]

## 2022-10-21 DIAGNOSIS — C61 Malignant neoplasm of prostate: Secondary | ICD-10-CM | POA: Diagnosis not present

## 2022-10-24 DIAGNOSIS — C61 Malignant neoplasm of prostate: Secondary | ICD-10-CM | POA: Diagnosis not present

## 2022-10-25 ENCOUNTER — Ambulatory Visit: Payer: Medicare HMO | Admitting: Psychology

## 2022-10-25 DIAGNOSIS — F4321 Adjustment disorder with depressed mood: Secondary | ICD-10-CM | POA: Diagnosis not present

## 2022-10-25 NOTE — Progress Notes (Signed)
[facility] Counselor/Therapist Progress Note  Patient ID: Dan [name], MRN: [id number],    Date: 10/25/2022  Time Spent:   45 mins    start time: 1000       end time: 1045  Treatment Type: Individual Therapy  Reported Symptoms: Pt presents in person in the office, granting consent for the session.  Mental Status Exam: Appearance:  Casual     Behavior: Appropriate  Motor: Normal  Speech/Language:  Clear and Coherent  Affect: Appropriate  Mood: normal  Thought process: normal  Thought content:   WNL  Sensory/Perceptual disturbances:   WNL  Orientation: oriented to person, place, and time/date  Attention: Good  Concentration: Good  Memory: WNL  Fund of knowledge:  Good  Insight:   Good  Judgment:  Good  Impulse Control: Good   Risk Assessment: Danger to Self:  No Self-injurious Behavior: No Danger to Others: No Duty to Warn:no Physical Aggression / Violence:No  Access to Firearms a concern: No  Gang Involvement:No   Subjective: Pt shares that his next radiation sessions have been fine; "I was a little tired yesterday but otherwise I feel fine.  The next time I come to see you I will be finished with the radiation.  They are so wonderful there and I am a glad to be there for treatment."  Pt shares that [name] is still being so helpful for him in taking him to and from his treatments.  He also enjoys [name] being here to care for their mom while he is going through his treatments.  Pt shares that  "on the Parkinson's front, I am able to get a patch for treatment of my symptoms, through [name], at a dramatically reduced cost; it was originally going to be $1000.00 per month but I am getting it for $75.00 per month; that is great for me."  Pt should get the patches in 3-5 days.  Pt and [name] are likely to be going back to the beach before the end of the year and pt is happy about that opportunity.  Encouraged pt to focus the next two weeks on himself and nurturing  himself well.  Pt continues to go to [name] is beneficial for pt and he enjoys them.  Encouraged pt to continue with his self care activities and we will meet next week for a follow up session.  Interventions: Cognitive Behavioral Therapy  Diagnosis:Adjustment disorder with depressed mood  Plan: Treatment Plan Strengths/Abilities:  Intelligent, Intuitive, Willing to participate in therapy Treatment Preferences:  Outpatient Individual Therapy Statement of Needs:  Patient is to use CBT, mindfulness and coping skills to help manage and/or decrease symptoms associated with their diagnosis. Symptoms:  Depressed/Irritable mood, worry, social withdrawal Problems Addressed:  Depressive thoughts, Sadness, Sleep issues, etc. Long Term Goals:  Pt to reduce overall level, frequency, and intensity of the feelings of depression as evidenced by decreased irritability, negative self talk, and helpless feelings from 6 to 7 days/week to 0 to 1 days/week, per client report, for at least 3 consecutive months.  Progress: 30% Short Term Goals:  Pt to verbally express understanding of the relationship between feelings of depression and their impact on thinking patterns and behaviors.  Pt to verbalize an understanding of the role that distorted thinking plays in creating fears, excessive worry, and ruminations.  Progress: 30% Target Date:  07/26/2023 Frequency:  Bi-weekly Modality:  Cognitive Behavioral Therapy Interventions by Therapist:  Therapist will use CBT, Mindfulness exercises, Coping skills and Referrals, as  needed by client. Client has verbally approved this treatment plan.  Karie Kirks, [facility]

## 2022-10-26 DIAGNOSIS — C61 Malignant neoplasm of prostate: Secondary | ICD-10-CM | POA: Diagnosis not present

## 2022-10-28 DIAGNOSIS — C61 Malignant neoplasm of prostate: Secondary | ICD-10-CM | POA: Diagnosis not present

## 2022-10-31 DIAGNOSIS — C61 Malignant neoplasm of prostate: Secondary | ICD-10-CM | POA: Diagnosis not present

## 2022-11-01 ENCOUNTER — Ambulatory Visit: Payer: Medicare HMO | Admitting: Psychology

## 2022-11-08 ENCOUNTER — Ambulatory Visit (INDEPENDENT_AMBULATORY_CARE_PROVIDER_SITE_OTHER): Payer: Medicare HMO | Admitting: Psychology

## 2022-11-08 DIAGNOSIS — F4321 Adjustment disorder with depressed mood: Secondary | ICD-10-CM | POA: Diagnosis not present

## 2022-11-08 NOTE — Progress Notes (Addendum)
[facility] Counselor/Therapist Progress Note  Patient ID: Dan [name], MRN: [id number],    Date: 11/08/2022  Time Spent:   60 mins    start time: 1100       end time: 1200  Treatment Type: Individual Therapy  Reported Symptoms: Pt presents in person in the office, granting consent for the session.  Mental Status Exam: Appearance:  Casual     Behavior: Appropriate  Motor: Normal  Speech/Language:  Clear and Coherent  Affect: Appropriate  Mood: normal  Thought process: normal  Thought content:   WNL  Sensory/Perceptual disturbances:   WNL  Orientation: oriented to person, place, and time/date  Attention: Good  Concentration: Good  Memory: WNL  Fund of knowledge:  Good  Insight:   Good  Judgment:  Good  Impulse Control: Good   Risk Assessment: Danger to Self:  No Self-injurious Behavior: No Danger to Others: No Duty to Warn:no Physical Aggression / Violence:No  Access to Firearms a concern: No  Gang Involvement:No   Subjective: Pt shares that his radiation treatments have concluded; "They told me I would feel better, likely beginning next week; I am just really fatigued.  I am trying to rest as much as possible.  I am hanging in there.  [name] has been helpful but he is not a natural care-giver."  Pt shares that he does not go back to the see oncologist until January.  Pt shares that he has been sleeping OK, although it is interrupted with him having to get up at night to use the restroom several times per night.  Pt shares that [name] went back home last Tuesday; pt was very thankful that she could come during his treatments to take care of their mom.  "I feel real grateful for [name] and the other people who have supported me during my treatments."  Pt is looking forward to getting back to [name] to re-start his exercise program.  He shares that he has fleeting episodes that involve him feeling somewhat depressed; "it is not overwhelming but I just want to  be aware of it."  Pt has started using the patches he got from his neurologist for his Parkinson's and sees the neurologist at [facility] next week.  Pt is also exhausted by the political ads, phone calls, texts, etc and will be happy when it is over today or this week.  Pt and [name] are likely to be going back to the beach before the end of the year and pt is happy about that opportunity.  Encouraged pt to continue with his self care activities and we will meet next week for a follow up session.  Interventions: Cognitive Behavioral Therapy  Diagnosis:Adjustment disorder with depressed mood  Plan: Treatment Plan Strengths/Abilities:  Intelligent, Intuitive, Willing to participate in therapy Treatment Preferences:  Outpatient Individual Therapy Statement of Needs:  Patient is to use CBT, mindfulness and coping skills to help manage and/or decrease symptoms associated with their diagnosis. Symptoms:  Depressed/Irritable mood, worry, social withdrawal Problems Addressed:  Depressive thoughts, Sadness, Sleep issues, etc. Long Term Goals:  Pt to reduce overall level, frequency, and intensity of the feelings of depression as evidenced by decreased irritability, negative self talk, and helpless feelings from 6 to 7 days/week to 0 to 1 days/week, per client report, for at least 3 consecutive months.  Progress: 30% Short Term Goals:  Pt to verbally express understanding of the relationship between feelings of depression and their impact on thinking patterns and behaviors.  Pt to verbalize an understanding of the role that distorted thinking plays in creating fears, excessive worry, and ruminations.  Progress: 30% Target Date:  07/26/2023 Frequency:  Bi-weekly Modality:  Cognitive Behavioral Therapy Interventions by Therapist:  Therapist will use CBT, Mindfulness exercises, Coping skills and Referrals, as needed by client. Client has verbally approved this treatment plan.  Karie Kirks, [facility]

## 2022-11-15 ENCOUNTER — Ambulatory Visit: Payer: Medicare HMO | Admitting: Psychology

## 2022-11-15 DIAGNOSIS — F4321 Adjustment disorder with depressed mood: Secondary | ICD-10-CM | POA: Diagnosis not present

## 2022-11-15 NOTE — Progress Notes (Signed)
[facility] Counselor/Therapist Progress Note  Patient ID: RENAE KORBER, MRN: [id number],    Date: 11/15/2022  Time Spent:   60 mins    start time: 1000       end time: 1100  Treatment Type: Individual Therapy  Reported Symptoms: Pt presents in person in the office, granting consent for the session.  Mental Status Exam: Appearance:  Casual     Behavior: Appropriate  Motor: Normal  Speech/Language:  Clear and Coherent  Affect: Appropriate  Mood: normal  Thought process: normal  Thought content:   WNL  Sensory/Perceptual disturbances:   WNL  Orientation: oriented to person, place, and time/date  Attention: Good  Concentration: Good  Memory: WNL  Fund of knowledge:  Good  Insight:   Good  Judgment:  Good  Impulse Control: Good   Risk Assessment: Danger to Self:  No Self-injurious Behavior: No Danger to Others: No Duty to Warn:no Physical Aggression / Violence:No  Access to Firearms a concern: No  Gang Involvement:No   Subjective: Pt shares that "things are starting to go in the right direction.  I am still feeling fatigued but it is getting better."  Pt shares that he is working to "get my life back to a new normal."  He shares he is taking a new medication, Flomax, to help him pee more effectively although he did not have a problem with that with his enlarged prostate.  He is experiencing a bit of dizziness which can be a side effect of the Flomax.  He shares that [name] continues to be anxious about pt and "[name] gets amped up for Christmas; he decorated my home last night, so it is Christmas at my home already."  Pt shares that he has been looking around his life and has seen others in it and it is helping him believe that his life is not so bad; he appreciates that perspective.  He is still going to [name] to Parkinson's exercise classes and balance classes as well; he likes the facility and the staff.  Pt shares that both of his brothers [name] and [name])  have reached out to him about coming to visit him and their mom.  He would like for them to come to see their mom because we never know the future.  He has suggested the week of 11/18 for their visit or after Thanksgiving.  [name] has a partner Brett Canales) he has been with for over 25 years.  "[name] has always been in his own world; he met a videographer 30+ years ago; she got pregnant and had twins (boy and girl); the kids are in their early 51's now."  The son lives in [place] and the daughter works in [place] for [employer].  Pt and [name] will be having Thanksgiving with [name]'s parents and pt's mom at [facility], as they normally do.  Pt has started using the patches he got from his neurologist for his Parkinson's and sees the neurologist at [facility] in December.  He has a follow up appt with the oncologist at [facility] in January.  Encouraged pt to continue with his self care activities and we will meet next week for a follow up session.  Interventions: Cognitive Behavioral Therapy  Diagnosis:Adjustment disorder with depressed mood  Plan: Treatment Plan Strengths/Abilities:  Intelligent, Intuitive, Willing to participate in therapy Treatment Preferences:  Outpatient Individual Therapy Statement of Needs:  Patient is to use CBT, mindfulness and coping skills to help manage and/or decrease symptoms associated  with their diagnosis. Symptoms:  Depressed/Irritable mood, worry, social withdrawal Problems Addressed:  Depressive thoughts, Sadness, Sleep issues, etc. Long Term Goals:  Pt to reduce overall level, frequency, and intensity of the feelings of depression as evidenced by decreased irritability, negative self talk, and helpless feelings from 6 to 7 days/week to 0 to 1 days/week, per client report, for at least 3 consecutive months.  Progress: 30% Short Term Goals:  Pt to verbally express understanding of the relationship between feelings of depression and their impact on thinking patterns and behaviors.  Pt to  verbalize an understanding of the role that distorted thinking plays in creating fears, excessive worry, and ruminations.  Progress: 30% Target Date:  07/26/2023 Frequency:  Bi-weekly Modality:  Cognitive Behavioral Therapy Interventions by Therapist:  Therapist will use CBT, Mindfulness exercises, Coping skills and Referrals, as needed by client. Client has verbally approved this treatment plan.  Karie Kirks, [facility]

## 2022-11-17 ENCOUNTER — Other Ambulatory Visit: Payer: Self-pay | Admitting: Neurology

## 2022-11-22 ENCOUNTER — Other Ambulatory Visit: Payer: Self-pay | Admitting: Neurology

## 2022-11-22 ENCOUNTER — Ambulatory Visit: Payer: Medicare HMO | Admitting: Psychology

## 2022-11-22 DIAGNOSIS — F4321 Adjustment disorder with depressed mood: Secondary | ICD-10-CM

## 2022-11-22 DIAGNOSIS — G20A1 Parkinson's disease without dyskinesia, without mention of fluctuations: Secondary | ICD-10-CM

## 2022-11-22 NOTE — Progress Notes (Signed)
[facility] Counselor/Therapist Progress Note  Patient ID: Dan [name], MRN: [id number],    Date: 11/22/2022  Time Spent:   60 mins    start time: 1000       end time: 1100  Treatment Type: Individual Therapy  Reported Symptoms: Pt presents in person in the office, granting consent for the session.  Mental Status Exam: Appearance:  Casual     Behavior: Appropriate  Motor: Normal  Speech/Language:  Clear and Coherent  Affect: Appropriate  Mood: normal  Thought process: normal  Thought content:   WNL  Sensory/Perceptual disturbances:   WNL  Orientation: oriented to person, place, and time/date  Attention: Good  Concentration: Good  Memory: WNL  Fund of knowledge:  Good  Insight:   Good  Judgment:  Good  Impulse Control: Good   Risk Assessment: Danger to Self:  No Self-injurious Behavior: No Danger to Others: No Duty to Warn:no Physical Aggression / Violence:No  Access to Firearms a concern: No  Gang Involvement:No   Subjective: Pt shares that "things this morning did not work as well with my medications and it was tough for me to get ready this morning."  Pt shares he is frustrated and frightened since his medications have been working less frequently and he feels isolated from his new neurologist at [name].  Pt shares he continues to care for his mom at [name]; he took her out to lunch yesterday.  His brother [name] is planning to come to visit their mom in early December; his brother [name] has not made any efforts to come yet.  Pt continues to go to [name] for his Parkinson's exercise classes and his balance classes and they are beneficial for him.  Pt continues to wake up at least twice at night to go to the bathroom but he has been going back to sleep eventually.  "I am trying to get my life back to a sense of normal after my treatments."  Pt has started using the patches he got from his neurologist for his Parkinson's and sees the neurologist at [name]  in December.  He has a follow up appt with the oncologist at [name] in January.  Encouraged pt to continue with his self care activities and we will meet next week for a follow up session.  Interventions: Cognitive Behavioral Therapy  Diagnosis:Adjustment disorder with depressed mood  Plan: Treatment Plan Strengths/Abilities:  Intelligent, Intuitive, Willing to participate in therapy Treatment Preferences:  Outpatient Individual Therapy Statement of Needs:  Patient is to use CBT, mindfulness and coping skills to help manage and/or decrease symptoms associated with their diagnosis. Symptoms:  Depressed/Irritable mood, worry, social withdrawal Problems Addressed:  Depressive thoughts, Sadness, Sleep issues, etc. Long Term Goals:  Pt to reduce overall level, frequency, and intensity of the feelings of depression as evidenced by decreased irritability, negative self talk, and helpless feelings from 6 to 7 days/week to 0 to 1 days/week, per client report, for at least 3 consecutive months.  Progress: 30% Short Term Goals:  Pt to verbally express understanding of the relationship between feelings of depression and their impact on thinking patterns and behaviors.  Pt to verbalize an understanding of the role that distorted thinking plays in creating fears, excessive worry, and ruminations.  Progress: 30% Target Date:  07/26/2023 Frequency:  Bi-weekly Modality:  Cognitive Behavioral Therapy Interventions by Therapist:  Therapist will use CBT, Mindfulness exercises, Coping skills and Referrals, as needed by client. Client has verbally approved this treatment plan.  Karie Kirks, [facility]

## 2022-11-28 ENCOUNTER — Encounter: Payer: Self-pay | Admitting: Gastroenterology

## 2022-11-29 ENCOUNTER — Ambulatory Visit: Payer: Medicare HMO | Admitting: Psychology

## 2022-11-29 DIAGNOSIS — F4321 Adjustment disorder with depressed mood: Secondary | ICD-10-CM | POA: Diagnosis not present

## 2022-11-29 NOTE — Progress Notes (Signed)
[place] [facility] Counselor/Therapist Progress Note  Patient ID: Dan [name], MRN: [id number],    Date: 11/29/2022  Time Spent:   60 mins    start time: 1000       end time: 1100  Treatment Type: Individual Therapy  Reported Symptoms: Pt presents in person in the office, granting consent for the session.  Mental Status Exam: Appearance:  Casual     Behavior: Appropriate  Motor: Normal  Speech/Language:  Clear and Coherent  Affect: Appropriate  Mood: normal  Thought process: normal  Thought content:   WNL  Sensory/Perceptual disturbances:   WNL  Orientation: oriented to person, place, and time/date  Attention: Good  Concentration: Good  Memory: WNL  Fund of knowledge:  Good  Insight:   Good  Judgment:  Good  Impulse Control: Good   Risk Assessment: Danger to Self:  No Self-injurious Behavior: No Danger to Others: No Duty to Warn:no Physical Aggression / Violence:No  Access to Firearms a concern: No  Gang Involvement:No   Subjective: Pt shares that "I have been getting more sleep because I have not been getting up as much at night as before."  Pt shares that he has been focusing on how blessed he is in his life (his health is improving, his mom is in a good place, he enjoys his relationship [name], etc.).  Pt shares he goes back to the neurologist on 12/10; he is frustrated that he is waking in the middle of the night and cannot get back to sleep.  He plans to talk with the neurologist about his sleep issues at his upcoming appt.  Pt shares that he and [name] have had some issues lately and [name] is being a bit difficult and has been more controlling in their relationship.  Talked with pt about why he thinks [name] may be doing that; [name] may feel like he cannot control pt's health issues and he might be controlling what he can do for pt.  Pt is very grateful for how his life is going right now; he is feeling more and more like himself lately; he is going to the gym more  frequently now.  Pt shares his brother [name] is coming to visit pt and their mom on 12/4 and is leaving the following Sunday (12/8).  Pt has still not heard an update from his brother [name].  Pt continues to go to [name] for his Parkinson's exercise classes and his balance classes and they are beneficial for him.  He has a follow up appt with the oncologist at [facility] in January.  Encouraged pt to continue with his self care activities and we will meet next week for a follow up session.  Interventions: Cognitive Behavioral Therapy  Diagnosis:Adjustment disorder with depressed mood  Plan: Treatment Plan Strengths/Abilities:  Intelligent, Intuitive, Willing to participate in therapy Treatment Preferences:  Outpatient Individual Therapy Statement of Needs:  Patient is to use CBT, mindfulness and coping skills to help manage and/or decrease symptoms associated with their diagnosis. Symptoms:  Depressed/Irritable mood, worry, social withdrawal Problems Addressed:  Depressive thoughts, Sadness, Sleep issues, etc. Long Term Goals:  Pt to reduce overall level, frequency, and intensity of the feelings of depression as evidenced by decreased irritability, negative self talk, and helpless feelings from 6 to 7 days/week to 0 to 1 days/week, per client report, for at least 3 consecutive months.  Progress: 30% Short Term Goals:  Pt to verbally express understanding of the relationship between feelings of depression and their  impact on thinking patterns and behaviors.  Pt to verbalize an understanding of the role that distorted thinking plays in creating fears, excessive worry, and ruminations.  Progress: 30% Target Date:  07/26/2023 Frequency:  Bi-weekly Modality:  Cognitive Behavioral Therapy Interventions by Therapist:  Therapist will use CBT, Mindfulness exercises, Coping skills and Referrals, as needed by client. Client has verbally approved this treatment plan.  Karie Kirks, [facility]

## 2022-12-06 ENCOUNTER — Ambulatory Visit: Payer: Medicare HMO | Admitting: Psychology

## 2022-12-06 DIAGNOSIS — F4321 Adjustment disorder with depressed mood: Secondary | ICD-10-CM

## 2022-12-06 NOTE — Progress Notes (Signed)
[facility] [facility] Counselor/Therapist Progress Note  Patient ID: Dan [name], MRN: [id number],    Date: 12/06/2022  Time Spent:   45 mins    start time: 1100       end time: 1145  Treatment Type: Individual Therapy  Reported Symptoms: Pt presents in person in the office, granting consent for the session.  Mental Status Exam: Appearance:  Casual     Behavior: Appropriate  Motor: Normal  Speech/Language:  Clear and Coherent  Affect: Appropriate  Mood: normal  Thought process: normal  Thought content:   WNL  Sensory/Perceptual disturbances:   WNL  Orientation: oriented to person, place, and time/date  Attention: Good  Concentration: Good  Memory: WNL  Fund of knowledge:  Good  Insight:   Good  Judgment:  Good  Impulse Control: Good   Risk Assessment: Danger to Self:  No Self-injurious Behavior: No Danger to Others: No Duty to Warn:no Physical Aggression / Violence:No  Access to Firearms a concern: No  Gang Involvement:No   Subjective: Pt shares that "My brother Dan [name] is coming today and my brother Dan [name] is coming tomorrow; I would rather not have them both here at the same time but there is nothing that I can do about that.  Dan [name] and I are going with our neighbors to the [employer] for brunch on Saturday and then are going to the [employer] for the day; I made arrangements for them to stay at a rental apt at [facility] for the whole time they are here so they can spend time with mom."  Pt shares that they had a great Thanksgiving meal with Dan [name]'s parents and pt's mom, then everyone came to pt's home for dessert.  Dan [name] goes home on Sunday and Dan [name] is going home on Monday.  Pt is looking forward to next week's session because both brothers will be back home and he will be thankful for that.  Pt also shares that Dan [name] is sick today and did not go to work today.  Pt is still scheduled to see Dr. Nedra [name], neurologist at [facility], on 12/10; because of that appt, he cannot have our  session on that date; we will meet Thursday of next week instead.  He has a follow up appt with the oncologist at [facility] in January.  Encouraged pt to continue with his self care activities and we will meet next week for a follow up session.  Interventions: Cognitive Behavioral Therapy  Diagnosis:Adjustment disorder with depressed mood  Plan: Treatment Plan Strengths/Abilities:  Intelligent, Intuitive, Willing to participate in therapy Treatment Preferences:  Outpatient Individual Therapy Statement of Needs:  Patient is to use CBT, mindfulness and coping skills to help manage and/or decrease symptoms associated with their diagnosis. Symptoms:  Depressed/Irritable mood, worry, social withdrawal Problems Addressed:  Depressive thoughts, Sadness, Sleep issues, etc. Long Term Goals:  Pt to reduce overall level, frequency, and intensity of the feelings of depression as evidenced by decreased irritability, negative self talk, and helpless feelings from 6 to 7 days/week to 0 to 1 days/week, per client report, for at least 3 consecutive months.  Progress: 30% Short Term Goals:  Pt to verbally express understanding of the relationship between feelings of depression and their impact on thinking patterns and behaviors.  Pt to verbalize an understanding of the role that distorted thinking plays in creating fears, excessive worry, and ruminations.  Progress: 30% Target Date:  07/26/2023 Frequency:  Bi-weekly Modality:  Cognitive Behavioral Therapy Interventions by Therapist:  Therapist will use CBT, Mindfulness exercises, Coping skills and Referrals, as needed by client. Client has verbally approved this treatment plan.  Karie Kirks, [facility]

## 2022-12-13 DIAGNOSIS — G20B2 Parkinson's disease with dyskinesia, with fluctuations: Secondary | ICD-10-CM | POA: Diagnosis not present

## 2022-12-15 ENCOUNTER — Encounter: Payer: Self-pay | Admitting: Family Medicine

## 2022-12-15 ENCOUNTER — Ambulatory Visit: Payer: Medicare HMO | Admitting: Psychology

## 2022-12-15 ENCOUNTER — Ambulatory Visit: Payer: Medicare HMO | Admitting: Family Medicine

## 2022-12-15 VITALS — BP 126/72 | HR 67 | Temp 97.9°F | Wt 213.0 lb

## 2022-12-15 DIAGNOSIS — C61 Malignant neoplasm of prostate: Secondary | ICD-10-CM | POA: Diagnosis not present

## 2022-12-15 DIAGNOSIS — F4321 Adjustment disorder with depressed mood: Secondary | ICD-10-CM | POA: Diagnosis not present

## 2022-12-15 DIAGNOSIS — F411 Generalized anxiety disorder: Secondary | ICD-10-CM

## 2022-12-15 DIAGNOSIS — E785 Hyperlipidemia, unspecified: Secondary | ICD-10-CM

## 2022-12-15 DIAGNOSIS — R7989 Other specified abnormal findings of blood chemistry: Secondary | ICD-10-CM | POA: Diagnosis not present

## 2022-12-15 DIAGNOSIS — G20B2 Parkinson's disease with dyskinesia, with fluctuations: Secondary | ICD-10-CM
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
## 2022-12-15 NOTE — Patient Instructions (Addendum)
Bloodwork next time. Order colonoscopy next time due to radiation treatment  Continue 1000 units of vitamin D   Recommended follow up: Return in about 6 months (around 06/15/2023) for physical or sooner if needed.Schedule b4 you leave.

## 2022-12-15 NOTE — Progress Notes (Signed)
Phone [phone] In person visit   Subjective:   Dan [name] is a 67 y.o. year old very pleasant male patient who presents for/with See problem oriented charting Chief Complaint  Patient presents with   Medical Management of Chronic Issues   Past Medical History-  Patient Active Problem List   Diagnosis Date Noted   Cancer of prostate with intermediate recurrence risk, stage T2B-C or Gleason 7 or prostate-specific antigen (PSA) 10-20 (HCC) 06/01/2022    Priority: High   Hypotestosteronemia 11/05/2015    Priority: High   Parkinson's disease (HCC) 10/30/2014    Priority: High   Low serum vitamin D 04/16/2015    Priority: Medium    Hyperlipidemia 05/24/2010    Priority: Medium    Anxiety state 08/11/2006    Priority: Medium    History of colon polyps 01/08/2018    Priority: Low   Bleeding hemorrhoid 05/24/2010    Priority: Low   Irritable bowel syndrome 08/29/2007    Priority: Low    Medications- reviewed and updated Current Outpatient Medications  Medication Sig Dispense Refill   Alpha-Lipoic Acid 200 MG TABS Take by mouth daily.      Ascorbic Acid (VITAMIN C) 1000 MG tablet Take 1,000 mg by mouth daily. With rose hips     B Complex Vitamins (VITAMIN B COMPLEX PO) Take by mouth daily.      carbidopa-levodopa (SINEMET IR) 25-100 MG tablet Take 2 pills at 6am/8:30/10:30/1pm/4pm May take 1 PRN daily if needed 915 tablet 0   cholecalciferol (VITAMIN D) 1000 units tablet Take 1,000 Units by mouth daily. Vit d 3- Take 125 mcg daily     FOLIC ACID PO Take 400 mg by mouth.      glucosamine-chondroitin 500-400 MG tablet Take 1 tablet by mouth daily.      L-Lysine 500 MG TABS Take 1,000 mg by mouth daily.      Multiple Vitamin (MULTIVITAMIN) tablet Take 1 tablet by mouth daily.     Omega-3 Fatty Acids (FISH OIL) 1000 MG CAPS Take 1,000 mg by mouth daily.     rOPINIRole (REQUIP) 2 MG tablet Take 1 tablet (2 mg total) by mouth 3 (three) times daily. 270 tablet 0   saw  palmetto 160 MG capsule Take 450 mg by mouth daily.      traZODone (DESYREL) 50 MG tablet Take 1-1.5 tablets (50-75 mg total) by mouth at bedtime as needed for sleep. 45 tablet 5   vitamin B-12 (CYANOCOBALAMIN) 1000 MCG tablet Take 1,000 mcg by mouth daily.     No current facility-administered medications for this visit.     Objective:  BP 126/72   Pulse 67   Temp 97.9 F (36.6 C)   Wt 213 lb (96.6 kg)   SpO2 97%   BMI 26.62 kg/m  Gen: NAD, resting comfortably CV: RRR no murmurs rubs or gallops Lungs: CTAB no crackles, wheeze, rhonchi Ext: no edema Skin: warm, dry Neuro: walks with cane    Assessment and Plan   #exercise- 3 of key instructors from [employer] have left and that has been hard on him and trying to get back in groove- mild weight gain so trying to turn that around.   #PROSTATE CANCER 2024- [facility] Earlene Plater but seeing radiation oncology at [facility] SBRT with them with 20-month repeat PSA planned   # Parkinson's disease-follows with Dr. Arbutus Leas and has seen [facility] neurology as well S: Medication: Sinemet instant release 2 tablets at 6AM 8AM 10AM 12PM 2PM 4PM 6PM  8PM and only gets about 2 hours of benefit -Uses a walker or cane- mainly cane lately  -Thankfully no falls once again  A/P: overall stable- continue current medications and follow up with [name]     #hyperlipidemia S: Medication:none  -mild weight gain -Elevated ASCVD risk over 10% but has declined CT calcium scoring or statin  Lab Results  Component Value Date   CHOL 177 06/09/2022   HDL 43.50 06/09/2022   LDLCALC 104 (H) 06/09/2022   TRIG 147.0 06/09/2022   CHOLHDL 4 06/09/2022   A/P: lipids mildly high- wants to hold off on CT calcium with recent medical interventions- reconsider at 6 months  # Insomnia/anxiety S: Medication: Occasional trazodone 75 mg- not sluggish on this lately Therapy- working with Maggie Font- has visit later today A/P: working well- continue current medications and  therapy  #Vitamin D deficiency S: Medication: 1000 units daily Last vitamin D Lab Results  Component Value Date   VD25OH 42.95 06/09/2022  A/P: stable- continue current medicines   #colonoscopy- technically due for this but we will order at next visit- was told to wait 6 months after SBRT   Recommended follow up: Return in about 6 months (around 06/15/2023) for physical or sooner if needed.Schedule b4 you leave. Future Appointments  Date Time Provider Department Center  12/15/2022  3:00 PM Karie Kirks [facility] None  12/20/2022 10:00 AM Jennelle Human Sheppard Plumber, [facility] None  02/16/2023 11:45 AM Dillard Essex, OT [facility]  02/16/2023 12:00 PM Gean Maidens, PT [facility]  02/16/2023 12:15 PM Schinke, Karie Georges, CCC-SLP [facility]  09/25/2023 10:00 AM [facility] ANNUAL WELLNESS VISIT 1 [facility] PEC    Lab/Order associations:   ICD-10-CM   1. Parkinson's disease with dyskinesia and fluctuating manifestations (HCC)  G20.B2     2. Hyperlipidemia, unspecified hyperlipidemia type  E78.5     3. Low serum vitamin D  R79.89     4. Cancer of prostate with intermediate recurrence risk, stage T2B-C or Gleason 7 or prostate-specific antigen (PSA) 10-20 (HCC) Chronic C61     5. Anxiety state  F41.1       No orders of the defined types were placed in this encounter.   Return precautions advised.  Tana Conch, MD

## 2022-12-20 ENCOUNTER — Ambulatory Visit: Payer: Medicare HMO | Admitting: Psychology

## 2022-12-20 DIAGNOSIS — F4321 Adjustment disorder with depressed mood: Secondary | ICD-10-CM | POA: Diagnosis not present

## 2022-12-20 NOTE — Progress Notes (Signed)
[facility] Counselor/Therapist Progress Note  Patient ID: Dan [name], MRN: [id number],    Date: 12/20/2022  Time Spent:  60 mins    start time: 1000       end time: 1100  Treatment Type: Individual Therapy  Reported Symptoms: Pt presents in person in the office, granting consent for the session.  Mental Status Exam: Appearance:  Casual     Behavior: Appropriate  Motor: Normal  Speech/Language:  Clear and Coherent  Affect: Appropriate  Mood: normal  Thought process: normal  Thought content:   WNL  Sensory/Perceptual disturbances:   WNL  Orientation: oriented to person, place, and time/date  Attention: Good  Concentration: Good  Memory: WNL  Fund of knowledge:  Good  Insight:   Good  Judgment:  Good  Impulse Control: Good   Risk Assessment: Danger to Self:  No Self-injurious Behavior: No Danger to Others: No Duty to Warn:no Physical Aggression / Violence:No  Access to Firearms a concern: No  Gang Involvement:No   Subjective: Pt shares that "My brother Dan [name] is apparently feeling guilty for not coming to see our mom when Dan [name] came down and Dan [name] was here to help during my treatments.  I have to let him be who he is.  Dan [name] and I had a great dinner with our friends across the parking lot and a new couple who have moved into our neighborhood.  They are delightful."  Pt shares that he took Dan [name] to lunch on Saturday back to the [employer] in [place] and they had a very nice day together; pt was doing it for all that Dan [name] has done for pt.  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  Pt shares that he continues to fall asleep well with Dan [name] but he always wakes up in the middle of the night to go to the restroom; "sometimes I can fall back to sleep and sometimes I cannot."  Pt reminds me about the "pump" that he might be getting related to providing his Parkinson's medications on a more regular basis and he is  hopeful that he could then travel more or have more evening activities.  Pt tries to make sure he concentrates on taking care of things in his life that he can do and can control; he has concerns about his future and how much he will be able to do what when.  He has a follow up appt with the oncologist at [facility] in January "and I am looking forward to getting that visit behind me."  Pt continues to go to [facility] for exercise regularly.  Encouraged pt to continue with his self care activities and we will meet next week for a follow up session.  Interventions: Cognitive Behavioral Therapy  Diagnosis:Adjustment disorder with depressed mood  Plan: Treatment Plan Strengths/Abilities:  Intelligent, Intuitive, Willing to participate in therapy Treatment Preferences:  Outpatient Individual Therapy Statement of Needs:  Patient is to use CBT, mindfulness and coping skills to help manage and/or decrease symptoms associated with their diagnosis. Symptoms:  Depressed/Irritable mood, worry, social withdrawal Problems Addressed:  Depressive thoughts, Sadness, Sleep issues, etc. Long Term Goals:  Pt to reduce overall level, frequency, and intensity of the feelings of depression as evidenced by decreased irritability, negative self talk, and helpless feelings from 6 to 7 days/week to 0 to 1 days/week, per client report, for at least 3 consecutive months.  Progress: 30% Short Term Goals:  Pt to verbally express understanding of  the relationship between feelings of depression and their impact on thinking patterns and behaviors.  Pt to verbalize an understanding of the role that distorted thinking plays in creating fears, excessive worry, and ruminations.  Progress: 30% Target Date:  07/26/2023 Frequency:  Bi-weekly Modality:  Cognitive Behavioral Therapy Interventions by Therapist:  Therapist will use CBT, Mindfulness exercises, Coping skills and Referrals, as needed by client. Client has verbally approved this  treatment plan.  Karie Kirks, [facility]

## 2023-01-03 ENCOUNTER — Ambulatory Visit: Payer: Medicare HMO | Admitting: Psychology

## 2023-01-05 ENCOUNTER — Ambulatory Visit: Payer: Medicare Other | Admitting: Psychology

## 2023-01-05 DIAGNOSIS — F4321 Adjustment disorder with depressed mood: Secondary | ICD-10-CM

## 2023-01-05 NOTE — Progress Notes (Signed)
 Dan [name]/Therapist Progress Note  Patient ID: Dan [name], MRN: [id number],    Date: 01/05/2023  Time Spent:  60 mins    start time: 1100       end time: 1200  Treatment Type: Individual Therapy  Reported Symptoms: Dan [name] presents in person in the office, granting consent for the session.  Mental Status Exam: Appearance:  Casual     Behavior: Appropriate  Motor: Normal  Speech/Language:  Clear and Coherent  Affect: Appropriate  Mood: normal  Thought process: normal  Thought content:   WNL  Sensory/Perceptual disturbances:   WNL  Orientation: oriented to person, place, and time/date  Attention: Good  Concentration: Good  Memory: WNL  Fund of knowledge:  Good  Insight:   Good  Judgment:  Good  Impulse Control: Good   Risk Assessment: Danger to Self:  No Self-injurious Behavior: No Danger to Others: No Duty to Warn:no Physical Aggression / Violence:No  Access to Firearms a concern: No  Gang Involvement:No   Subjective: Dan [name] shares that I had very nice Christmas Eve and Christmas Day events and we also had a nice early New Year's Eve celebration with friends.  I did have a couple of blue spells over the past couple of weeks but they all passed very quickly.  Dan [name] shares he has taken his car to the repair shop and he was really pleased with how well things went for him.  Dan [name] and Dan [name] went to [employer] for several days the day after Christmas and they had a nice time; we went to some nice restaurants and the weather was beautiful.  Dan [name] shares that while he and Dan [name] were at [employer], [employer] Dan [name]) went to visit Dan [name]'s mom at [facility], took her lunch, picked up her apt, and even took Dan [name]'s mom's clothes home and washed them.  Dan [name] appreciates Dan [name] doing this for his mom.  Dan [name] went to see his mom last night and she was doing fine.  Dan [name] shares that he has his follow up appt with the oncologist at [facility] on 1/30 at 11 am; he is hopeful that he will get good news  during that visit.  He is also expecting to get is pump for his Parkinson's medication this month as well.  Dan [name] continues to go to [name] for exercise regularly.  Encouraged Dan [name] to continue with his self care activities and we will meet next week for a follow up session.  Interventions: Cognitive Behavioral Therapy  Diagnosis:Adjustment disorder with depressed mood  Plan: Treatment Plan Strengths/Abilities:  Intelligent, Intuitive, Willing to participate in therapy Treatment Preferences:  Outpatient Individual Therapy Statement of Needs:  Patient is to use CBT, mindfulness and coping skills to help manage and/or decrease symptoms associated with their diagnosis. Symptoms:  Depressed/Irritable mood, worry, social withdrawal Problems Addressed:  Depressive thoughts, Sadness, Sleep issues, etc. Long Term Goals:  Dan [name] to reduce overall level, frequency, and intensity of the feelings of depression as evidenced by decreased irritability, negative self talk, and helpless feelings from 6 to 7 days/week to 0 to 1 days/week, per client report, for at least 3 consecutive months.  Progress: 30% Short Term Goals:  Dan [name] to verbally express understanding of the relationship between feelings of depression and their impact on thinking patterns and behaviors.  Dan [name] to verbalize an understanding of the role that distorted thinking plays in creating fears, excessive worry, and ruminations.  Progress: 30% Target Date:  07/26/2023 Frequency:  Bi-weekly Modality:  Cognitive Behavioral  Therapy Interventions by Therapist:  Therapist will use CBT, Mindfulness exercises, Coping skills and Referrals, as needed by client. Client has verbally approved this treatment plan.  Dan [name], [facility]

## 2023-01-10 ENCOUNTER — Ambulatory Visit: Payer: Medicare Other | Admitting: Psychology

## 2023-01-10 DIAGNOSIS — F4321 Adjustment disorder with depressed mood: Secondary | ICD-10-CM

## 2023-01-10 NOTE — Progress Notes (Addendum)
 [facility] Counselor/Therapist Progress Note  Patient ID: Dan [name], MRN: [id number],    Date: 01/10/2023  Time Spent:  60 mins    start time: 1500       end time: 1600  Treatment Type: Individual Therapy  Reported Symptoms: Pt presents in person in the office, granting consent for the session.  Mental Status Exam: Appearance:  Casual     Behavior: Appropriate  Motor: Normal  Speech/Language:  Clear and Coherent  Affect: Appropriate  Mood: normal  Thought process: normal  Thought content:   WNL  Sensory/Perceptual disturbances:   WNL  Orientation: oriented to person, place, and time/date  Attention: Good  Concentration: Good  Memory: WNL  Fund of knowledge:  Good  Insight:   Good  Judgment:  Good  Impulse Control: Good   Risk Assessment: Danger to Self:  No Self-injurious Behavior: No Danger to Others: No Duty to Warn:no Physical Aggression / Violence:No  Access to Firearms a concern: No  Gang Involvement:No   Subjective: Pt shares that I had nice event to attend on New Year's eve; we did not stay late but it was still nice to be social for a while.  I have been struggling with the sleeping thing right now.  I am still waiting for the insurance to approve the pump for my Parkinson's medications.  I have had a good day today; I went to see my mom this morning, had breakfast, and went to my Parkinson's exercise class earlier today as well.  Pt notes that he enjoys doing things for his mom; he knows there will come a day when he can no longer do these things.  [name] and his mom were encouraging pt to place his mom in assisted living but he will keep doing what he is doing for as long as he can.  Pt shares he goes back to [name] on 1/30 for his cancer follow up visit.  Pt also shares that he has talked with his brother, [name], who was supposed to come here over the holidays but did not.  Pt has been thinking about what his life might be like when he needs to go  to a facility for care; [name] is not a natural caregiver but pt appreciates what he does for pt; pt has been trying to prepare [name] for what the future holds for pt.  Pt knows he will need to work with [name] to get him ready for pt's future; I want to be sure I am not a burden to [place].  Pt shares that [name] is coming back to [place] soon and pt is happy about that because I need some help from her.  Pt shares that he has no angst in his life right now and he is happy about that.  Pt shares his car is still at the repair shop and they are waiting for the parts still.  Pt continues to go to [name] for exercise regularly.  Encouraged pt to continue with his self care activities and we will meet in 2 weeks for a follow up session, due to his dental appt next week.  Interventions: Cognitive Behavioral Therapy  Diagnosis:Adjustment disorder with depressed mood  Plan: Treatment Plan Strengths/Abilities:  Intelligent, Intuitive, Willing to participate in therapy Treatment Preferences:  Outpatient Individual Therapy Statement of Needs:  Patient is to use CBT, mindfulness and coping skills to help manage and/or decrease symptoms associated with their diagnosis. Symptoms:  Depressed/Irritable mood, worry, social withdrawal Problems Addressed:  Depressive thoughts, Sadness, Sleep issues, etc. Long Term Goals:  Pt to reduce overall level, frequency, and intensity of the feelings of depression as evidenced by decreased irritability, negative self talk, and helpless feelings from 6 to 7 days/week to 0 to 1 days/week, per client report, for at least 3 consecutive months.  Progress: 30% Short Term Goals:  Pt to verbally express understanding of the relationship between feelings of depression and their impact on thinking patterns and behaviors.  Pt to verbalize an understanding of the role that distorted thinking plays in creating fears, excessive worry, and ruminations.  Progress: 30% Target Date:   07/26/2023 Frequency:  Bi-weekly Modality:  Cognitive Behavioral Therapy Interventions by Therapist:  Therapist will use CBT, Mindfulness exercises, Coping skills and Referrals, as needed by client. Client has verbally approved this treatment plan.  Francis KATHEE Macintosh, [facility]

## 2023-01-17 ENCOUNTER — Ambulatory Visit: Payer: Medicare HMO | Admitting: Psychology

## 2023-01-17 DIAGNOSIS — K080 Exfoliation of teeth due to systemic causes: Secondary | ICD-10-CM | POA: Diagnosis not present

## 2023-01-24 ENCOUNTER — Ambulatory Visit: Payer: Medicare Other | Admitting: Psychology

## 2023-01-24 DIAGNOSIS — F4321 Adjustment disorder with depressed mood: Secondary | ICD-10-CM

## 2023-01-24 NOTE — Progress Notes (Signed)
**Note De-Identified Dan Obfuscation** Dan [name] Counselor/Therapist Progress Note  Patient ID: Dan [name], MRN: [id number],    Date: 01/24/2023  Time Spent:  60 mins    start time: 1000       end time: 1100  Treatment Type: Individual Therapy  Reported Symptoms: Pt presents in person in the office, granting consent for the session.  Mental Status Exam: Appearance:  Casual     Behavior: Appropriate  Motor: Normal  Speech/Language:  Clear and Coherent  Affect: Appropriate  Mood: normal  Thought process: normal  Thought content:   WNL  Sensory/Perceptual disturbances:   WNL  Orientation: oriented to person, place, and time/date  Attention: Good  Concentration: Good  Memory: WNL  Fund of knowledge:  Good  Insight:   Good  Judgment:  Good  Impulse Control: Good   Risk Assessment: Danger to Self:  No Self-injurious Behavior: No Danger to Others: No Duty to Warn:no Physical Aggression / Violence:No  Access to Firearms a concern: No  Gang Involvement:No   Subjective: Pt shares that "I went to see my mom (74 yo) yesterday and washed and styled her hair.  Dan [name] (68 yo) is always going really fast at everything his is doing."  Pt shares that he did not sleep great last night after he woke up at 3am this morning and his brain started working overtime.  Pt has been approached to join the [name] for their town home community and he thinks that would be a nice thing for him to do.  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  Pt shares he goes back to [name] on 1/30 for his first cancer follow up visit.  Pt is excited to go tot he appt, "because I will then know where I stand in my recovery."  Pt shares that he is not anxious or nervous about the appt because is believes he made the best decision about his treatment choice.  Pt shares that he has been focusing on his gratitude for all that he has going for him and how well he is doing, even though there are things that  he would like to be better.  Pt shares that he is a bit more stressed about his Parkinson's treatment; he is scheduled for a follow up appt in March with Dan [name] on that issue.  Pt is still trying to get the approval for the pump and the medication for his Parkinson's.  Pt continues to go to [name] for exercise regularly.  Encouraged pt to continue with his self care activities and we will meet next week for a follow up session.  Interventions: Cognitive Behavioral Therapy  Diagnosis:Adjustment disorder with depressed mood  Plan: Treatment Plan Strengths/Abilities:  Intelligent, Intuitive, Willing to participate in therapy Treatment Preferences:  Outpatient Individual Therapy Statement of Needs:  Patient is to use CBT, mindfulness and coping skills to help manage and/or decrease symptoms associated with their diagnosis. Symptoms:  Depressed/Irritable mood, worry, social withdrawal Problems Addressed:  Depressive thoughts, Sadness, Sleep issues, etc. Long Term Goals:  Pt to reduce overall level, frequency, and intensity of the feelings of depression as evidenced by decreased irritability, negative self talk, and helpless feelings from 6 to 7 days/week to 0 to 1 days/week, per client report, for at least 3 consecutive months.  Progress: 30% Short Term Goals:  Pt to verbally express understanding of the relationship between feelings of depression and their impact on thinking patterns and behaviors.  Pt to  verbalize an understanding of the role that distorted thinking plays in creating fears, excessive worry, and ruminations.  Progress: 30% Target Date:  07/26/2023 Frequency:  Bi-weekly Modality:  Cognitive Behavioral Therapy Interventions by Therapist:  Therapist will use CBT, Mindfulness exercises, Coping skills and Referrals, as needed by client. Client has verbally approved this treatment plan.  Dan [name], Dan [name]

## 2023-01-30 DIAGNOSIS — D2272 Melanocytic nevi of left lower limb, including hip: Secondary | ICD-10-CM | POA: Diagnosis not present

## 2023-01-30 DIAGNOSIS — L821 Other seborrheic keratosis: Secondary | ICD-10-CM | POA: Diagnosis not present

## 2023-01-30 DIAGNOSIS — D225 Melanocytic nevi of trunk: Secondary | ICD-10-CM | POA: Diagnosis not present

## 2023-01-30 DIAGNOSIS — D2262 Melanocytic nevi of left upper limb, including shoulder: Secondary | ICD-10-CM | POA: Diagnosis not present

## 2023-01-31 ENCOUNTER — Ambulatory Visit: Payer: Medicare Other | Admitting: Psychology

## 2023-01-31 DIAGNOSIS — F4321 Adjustment disorder with depressed mood: Secondary | ICD-10-CM

## 2023-01-31 NOTE — Progress Notes (Signed)
[facility] Counselor/Therapist Progress Note  Patient ID: Dan [name], MRN: [id number],    Date: 01/24/2023  Time Spent:  60 mins    start time: 1000       end time: 1100  Treatment Type: Individual Therapy  Reported Symptoms: Pt presents in person in the office, granting consent for the session.  Mental Status Exam: Appearance:  Casual     Behavior: Appropriate  Motor: Normal  Speech/Language:  Clear and Coherent  Affect: Appropriate  Mood: normal  Thought process: normal  Thought content:   WNL  Sensory/Perceptual disturbances:   WNL  Orientation: oriented to person, place, and time/date  Attention: Good  Concentration: Good  Memory: WNL  Fund of knowledge:  Good  Insight:   Good  Judgment:  Good  Impulse Control: Good   Risk Assessment: Danger to Self:  No Self-injurious Behavior: No Danger to Others: No Duty to Warn:no Physical Aggression / Violence:No  Access to Firearms a concern: No  Gang Involvement:No   Subjective: Pt shares that "My medicine has not kicked in as quickly as normal this morning and I am sorry."  Pt is a bit more shaky this morning than he normally is.  Shared with pt that this is a safe place for him and he never needs to be concerned about his presentation in our sessions.  Pt shares that he and [name] had a nice dinner with friends on Saturday evening and pt appreciates the friends he has.  Pt shares that he is realizing that he is starting to decline more rapidly of late than he has been.  He is hopeful that this new pump will help him get his medication more reliably than it has been getting into his system.  He also shares that he goes back to [name] on Thursday to follow up for his prostate radiation treatment; pt is hopeful that the results of the visit will be positive.  Pt is aware that his mom, [name], and his friends are his support network; he knows that his support network is a result of his work before becoming ill with  Parkinson's and prostate cancer.  Pt is planning to go to see his mom today after our session.  Pt is scheduled for a follow up appt in March with the [facility] practice.  Pt is still trying to get the approval for the pump and the medication for his Parkinson's.  Pt continues to go to [name] for exercise regularly; he was not able to go one day last week because his medicine had not kicked in.  He also missed church on Sunday because he did not feel safe driving that morning.  Encouraged pt to continue with his self care activities and we will meet next week for a follow up session.  Interventions: Cognitive Behavioral Therapy  Diagnosis:Adjustment disorder with depressed mood  Plan: Treatment Plan Strengths/Abilities:  Intelligent, Intuitive, Willing to participate in therapy Treatment Preferences:  Outpatient Individual Therapy Statement of Needs:  Patient is to use CBT, mindfulness and coping skills to help manage and/or decrease symptoms associated with their diagnosis. Symptoms:  Depressed/Irritable mood, worry, social withdrawal Problems Addressed:  Depressive thoughts, Sadness, Sleep issues, etc. Long Term Goals:  Pt to reduce overall level, frequency, and intensity of the feelings of depression as evidenced by decreased irritability, negative self talk, and helpless feelings from 6 to 7 days/week to 0 to 1 days/week, per client report, for at least 3 consecutive months.  Progress: 30%  Short Term Goals:  Pt to verbally express understanding of the relationship between feelings of depression and their impact on thinking patterns and behaviors.  Pt to verbalize an understanding of the role that distorted thinking plays in creating fears, excessive worry, and ruminations.  Progress: 30% Target Date:  07/26/2023 Frequency:  Bi-weekly Modality:  Cognitive Behavioral Therapy Interventions by Therapist:  Therapist will use CBT, Mindfulness exercises, Coping skills and Referrals, as needed by  client. Client has verbally approved this treatment plan.  Karie Kirks, [facility]

## 2023-02-02 DIAGNOSIS — C61 Malignant neoplasm of prostate: Secondary | ICD-10-CM | POA: Diagnosis not present

## 2023-02-09 ENCOUNTER — Ambulatory Visit: Payer: Medicare Other | Admitting: Psychology

## 2023-02-09 DIAGNOSIS — F4321 Adjustment disorder with depressed mood: Secondary | ICD-10-CM | POA: Diagnosis not present

## 2023-02-09 NOTE — Progress Notes (Signed)
 [facility] Counselor/Therapist Progress Note  Patient ID: Dan [name], MRN: [id number],    Date: 01/24/2023  Time Spent:  60 mins    start time: 1100    end time: 1200  Treatment Type: Individual Therapy  Reported Symptoms: Pt presents in person in the office, granting consent for the session.  Mental Status Exam: Appearance:  Casual     Behavior: Appropriate  Motor: Normal  Speech/Language:  Clear and Coherent  Affect: Appropriate  Mood: normal  Thought process: normal  Thought content:   WNL  Sensory/Perceptual disturbances:   WNL  Orientation: oriented to person, place, and time/date  Attention: Good  Concentration: Good  Memory: WNL  Fund of knowledge:  Good  Insight:   Good  Judgment:  Good  Impulse Control: Good   Risk Assessment: Danger to Self:  No Self-injurious Behavior: No Danger to Others: No Duty to Warn:no Physical Aggression / Violence:No  Access to Firearms a concern: No  Gang Involvement:No   Subjective: Pt shares that I had my prostate cancer follow up appt with [name] last week and it went great.  My PSA results went from over 10 to just over 5 and everyone was really happy about that.  I don't have to go back for 6 months this time.  [employer] does not  want pay for my new Parkinson's medication and the pump.  I am going to keep fighting for it because my regular meds are not working quite as well as they had been.  I am not sleeping as well recently either.  I have been taking naps during the day to make up for the sleep I am missing at night.  Pt wonders if the radiation treatments had anything to do with his sleep interruptions, but he is not sure.  Pt shares that he and [name] were talking about [facility] and [name] suggested that he get on the waiting list for there; pt is considering this as an option.  He is not ready to move into there now but he is thinking about what his plan should be in the future.  Pt continues to go to [name]  for exercise regularly.  He also missed church on Sunday because he did not feel safe driving that morning; this is the second Sunday that he has missed.  Encouraged pt to continue with his self care activities and we will meet next week for a follow up session.  Interventions: Cognitive Behavioral Therapy  Diagnosis:Adjustment disorder with depressed mood  Plan: Treatment Plan Strengths/Abilities:  Intelligent, Intuitive, Willing to participate in therapy Treatment Preferences:  Outpatient Individual Therapy Statement of Needs:  Patient is to use CBT, mindfulness and coping skills to help manage and/or decrease symptoms associated with their diagnosis. Symptoms:  Depressed/Irritable mood, worry, social withdrawal Problems Addressed:  Depressive thoughts, Sadness, Sleep issues, etc. Long Term Goals:  Pt to reduce overall level, frequency, and intensity of the feelings of depression as evidenced by decreased irritability, negative self talk, and helpless feelings from 6 to 7 days/week to 0 to 1 days/week, per client report, for at least 3 consecutive months.  Progress: 30% Short Term Goals:  Pt to verbally express understanding of the relationship between feelings of depression and their impact on thinking patterns and behaviors.  Pt to verbalize an understanding of the role that distorted thinking plays in creating fears, excessive worry, and ruminations.  Progress: 30% Target Date:  07/26/2023 Frequency:  Bi-weekly Modality:  Cognitive Behavioral Therapy Interventions  by Therapist:  Therapist will use CBT, Mindfulness exercises, Coping skills and Referrals, as needed by client. Client has verbally approved this treatment plan.  Francis KATHEE Macintosh, [facility]

## 2023-02-14 ENCOUNTER — Ambulatory Visit: Payer: Medicare Other | Admitting: Psychology

## 2023-02-14 DIAGNOSIS — F4321 Adjustment disorder with depressed mood: Secondary | ICD-10-CM

## 2023-02-14 NOTE — Progress Notes (Signed)
[facility] Counselor/Therapist Progress Note  Patient ID: Dan [name], MRN: [id number],    Date: 01/24/2023  Time Spent:  60 mins    start time: 1000    end time: 1100  Treatment Type: Individual Therapy  Reported Symptoms: Pt presents in person in the office, granting consent for the session.  Mental Status Exam: Appearance:  Casual     Behavior: Appropriate  Motor: Normal  Speech/Language:  Clear and Coherent  Affect: Appropriate  Mood: normal  Thought process: normal  Thought content:   WNL  Sensory/Perceptual disturbances:   WNL  Orientation: oriented to person, place, and time/date  Attention: Good  Concentration: Good  Memory: WNL  Fund of knowledge:  Good  Insight:   Good  Judgment:  Good  Impulse Control: Good   Risk Assessment: Danger to Self:  No Self-injurious Behavior: No Danger to Others: No Duty to Warn:no Physical Aggression / Violence:No  Access to Firearms a concern: No  Gang Involvement:No   Subjective: Pt shares that "I had a MyChart message yesterday from the neurologist's office at [name] and [employer] has approved the medication but not the delivery device.  My doctor is pursuing the approval of the device.  Also, I went to see my mother on Friday and it turns she had a boil on her back.  We went to the doctor yesterday and they lanced it and my mom is doing well."  Pt shares that he slept a bit better last night and he shares that he is thankful for that.  We talked about the benefits of gratitude, especially for pt in his current circumstance.  Pt shares he is going to see his urologist in March and wants to talk with him about his recovery from the radiation exposure to make sure he is taking good care of himself.  Pt shares that Dan [name] does not cook and he buys very few groceries; he has recently offered to give pt money for groceries because pt cooks daily for [employer].  Pt appreciated the offer and thanked Dan [name] for his  generosity.  Pt shares that he wants to look for other exercise classes at [facility]; the chair exercise class for Parkinson's is not exactly what he wants.  Pt shares his sister, Dan [name], may choose to come visit in March and May.  Encouraged pt to continue with his self care activities and we will meet next week for a follow up session.  Interventions: Cognitive Behavioral Therapy  Diagnosis:Adjustment disorder with depressed mood  Plan: Treatment Plan Strengths/Abilities:  Intelligent, Intuitive, Willing to participate in therapy Treatment Preferences:  Outpatient Individual Therapy Statement of Needs:  Patient is to use CBT, mindfulness and coping skills to help manage and/or decrease symptoms associated with their diagnosis. Symptoms:  Depressed/Irritable mood, worry, social withdrawal Problems Addressed:  Depressive thoughts, Sadness, Sleep issues, etc. Long Term Goals:  Pt to reduce overall level, frequency, and intensity of the feelings of depression as evidenced by decreased irritability, negative self talk, and helpless feelings from 6 to 7 days/week to 0 to 1 days/week, per client report, for at least 3 consecutive months.  Progress: 30% Short Term Goals:  Pt to verbally express understanding of the relationship between feelings of depression and their impact on thinking patterns and behaviors.  Pt to verbalize an understanding of the role that distorted thinking plays in creating fears, excessive worry, and ruminations.  Progress: 30% Target Date:  07/26/2023 Frequency:  Bi-weekly Modality:  Cognitive Behavioral Therapy  Interventions by Therapist:  Therapist will use CBT, Mindfulness exercises, Coping skills and Referrals, as needed by client. Client has verbally approved this treatment plan.  Karie Kirks, [facility]

## 2023-02-16 ENCOUNTER — Ambulatory Visit: Payer: Medicare HMO | Admitting: Physical Therapy

## 2023-02-16 ENCOUNTER — Ambulatory Visit: Payer: Medicare HMO | Attending: Neurology | Admitting: Occupational Therapy

## 2023-02-16 ENCOUNTER — Ambulatory Visit: Payer: Medicare HMO

## 2023-02-16 DIAGNOSIS — R2689 Other abnormalities of gait and mobility: Secondary | ICD-10-CM | POA: Insufficient documentation

## 2023-02-16 DIAGNOSIS — R471 Dysarthria and anarthria: Secondary | ICD-10-CM

## 2023-02-16 DIAGNOSIS — R29818 Other symptoms and signs involving the nervous system: Secondary | ICD-10-CM | POA: Insufficient documentation

## 2023-02-16 NOTE — Therapy (Signed)
[place] [facility] [address], [address] [place], [place], [address] Phone: [phone]   Fax:  [phone]  Patient Details  Name: Dan [name] MRN: [id number] Date of Birth: 01-17-1955 Referring Provider:  Shelva Majestic, MD  Encounter Date: 02/16/2023  Physical Therapy Parkinson's Disease Screen   Timed Up and Go test: 8.79 sec (compared to 8.66 sec)  10 meter walk test: 7.36 sec (4.45 ft/sec) (compared to 5.2 ft/sec)  5 time sit to stand test: 13.06 sec (compared to 7.72 sec)   Patient does not require Physical Therapy services at this time.  Recommend Physical Therapy screen in 6-9 months   *Pt has gone through treatment for prostate cancer and has completed radiation.  He does reports difficulty with sleep and definite off-times.  No falls.  Goes to several classes/week at [employer].  Michail Boyte W., PT 02/16/2023, 10:16 AM  [place]  [facility] [address], [address] [place], [place], [address] Phone: [phone]   Fax:  [phone]

## 2023-02-16 NOTE — Therapy (Signed)
[place] [facility] [address] [place], [place], [address] Phone: [phone]   Fax:  [phone]  Patient Details  Name: Dan [name] MRN: [id number] Date of Birth: 16-Dec-1955 Referring Provider:  Shelva Majestic, MD  Encounter Date: 02/16/2023  Occupational Therapy Parkinson's Disease Screen  Hand dominance:  right   Fastening/unfastening 3 buttons in:  24.00 sec  9-hole peg test:    RUE  22.84 sec        LUE  28.31 sec  Other Comments:  PT reports being diagnosed with cancer "latter part of 2024".  Pt reports that he underwent radiation with [name] with only side effect being fatigue.  Pt reports going to 3 month check up and reports that he will not need to f/u with them for another 6 months as numbers are looking good.  Pt reports that he recognizes that his sleep has been quite disrupted between PD and post radiation treatment.  Pt reports seeing a physician at [facility] for his PD and reports that he has been approved for the new PD medication - and he is looking forward to this possibility.  Pt reports noticing that his "window" has gotten smaller, he is up to taking carbidopa-levodopa 6x/day.  Pt reports going to the PD classes at [facility].  During off times, he notices that handwriting is significantly smaller.     Pt does not require occupational therapy services at this time.  Recommended occupational therapy screen in  6-9 months   [place], [place], OT 02/16/2023, 11:48 AM  [place] [facility] [address], [address] [place], [place], [address] Phone: [phone]   Fax:  [phone]

## 2023-02-16 NOTE — Therapy (Signed)
[place] [facility] [address] [place], [place], [address] Phone: [phone]   Fax:  [phone]  Patient Details  Name: Dan [name] MRN: [id number] Date of Birth: [birth year] Referring Provider:  Kerin Salen, DO  Encounter Date: 02/16/2023  Speech Therapy Parkinson's Disease Screen   Decibel Level today: 70dB  (WNL=70-72 dB) with sound level meter 30cm away from pt's mouth. Pt's conversational volume has remained since last treatment course  Pt does not report difficulty with swallowing, which does not warrant further evaluation  Pt does not require speech therapy services at this time. Recommend ST screen in another 6-9 months   Deadrick Stidd, CCC-SLP 02/16/2023, 12:18 PM  [facility] [address] [place], [place], [address] Phone: [phone]   Fax:  [phone]

## 2023-02-21 ENCOUNTER — Ambulatory Visit: Payer: Medicare Other | Admitting: Psychology

## 2023-02-21 DIAGNOSIS — F4321 Adjustment disorder with depressed mood: Secondary | ICD-10-CM

## 2023-02-21 NOTE — Progress Notes (Signed)
Behavioral Health Counselor/Therapist Progress Note  Patient ID: Dan [name], MRN: [id number],    Date: 01/24/2023  Time Spent:  60 mins    start time: 1000    end time: 1100  Treatment Type: Individual Therapy  Reported Symptoms: Pt presents in person in the office, granting consent for the session.  Mental Status Exam: Appearance:  Casual     Behavior: Appropriate  Motor: Normal  Speech/Language:  Clear and Coherent  Affect: Appropriate  Mood: normal  Thought process: normal  Thought content:   WNL  Sensory/Perceptual disturbances:   WNL  Orientation: oriented to person, place, and time/date  Attention: Good  Concentration: Good  Memory: WNL  Fund of knowledge:  Good  Insight:   Good  Judgment:  Good  Impulse Control: Good   Risk Assessment: Danger to Self:  No Self-injurious Behavior: No Danger to Others: No Duty to Warn:no Physical Aggression / Violence:No  Access to Firearms a concern: No  Gang Involvement:No   Subjective: Pt shares that "I have been doing OK since our last session, but I did not sleep well last night.  It has been a busy week, especially with my mother.  My friend, [name], has talked with me about her experience while caring for her husband who was ill until he passed away and how it changed for her when he passed away; she had so much more time on her hands after he passed.  He is now thinking about what it will be like for him when his mom does pass; he is not morbid in his consideration, but he is thinking about how his life will be different.  Pt shares that the neurology office at [facility] is still attempting to get the delivery device approved.  He has turned his and his mom's tax info to the accountant this week.  [name] is planning his vacation to [place] for this summer and pt is encouraging him to do that.  Pt shares that he tries to do nice things for [name] because pt is not able to do the same things as he used to do prior to his  Parkinson's diagnosis.  Pt shares that he wants to look for other exercise classes at [facility]; the chair exercise class for Parkinson's is not exactly what he wants.  Pt shares his sister, [name], may choose to come visit in March and May.  Pt did make it to church this past Sunday and he enjoyed that.  Encouraged pt to continue with his self care activities and we will meet next week for a follow up session.  Interventions: Cognitive Behavioral Therapy  Diagnosis:Adjustment disorder with depressed mood  Plan: Treatment Plan Strengths/Abilities:  Intelligent, Intuitive, Willing to participate in therapy Treatment Preferences:  Outpatient Individual Therapy Statement of Needs:  Patient is to use CBT, mindfulness and coping skills to help manage and/or decrease symptoms associated with their diagnosis. Symptoms:  Depressed/Irritable mood, worry, social withdrawal Problems Addressed:  Depressive thoughts, Sadness, Sleep issues, etc. Long Term Goals:  Pt to reduce overall level, frequency, and intensity of the feelings of depression as evidenced by decreased irritability, negative self talk, and helpless feelings from 6 to 7 days/week to 0 to 1 days/week, per client report, for at least 3 consecutive months.  Progress: 30% Short Term Goals:  Pt to verbally express understanding of the relationship between feelings of depression and their impact on thinking patterns and behaviors.  Pt to verbalize an understanding of the role that distorted  thinking plays in creating fears, excessive worry, and ruminations.  Progress: 30% Target Date:  07/26/2023 Frequency:  Bi-weekly Modality:  Cognitive Behavioral Therapy Interventions by Therapist:  Therapist will use CBT, Mindfulness exercises, Coping skills and Referrals, as needed by client. Client has verbally approved this treatment plan.  Karie Kirks, [facility]

## 2023-02-28 ENCOUNTER — Ambulatory Visit: Payer: Medicare Other | Admitting: Psychology

## 2023-02-28 DIAGNOSIS — F4321 Adjustment disorder with depressed mood: Secondary | ICD-10-CM

## 2023-02-28 NOTE — Progress Notes (Addendum)
 [facility] Counselor/Therapist Progress Note  Patient ID: Dan [name], MRN: [id number],    Date: 02/28/2023  Time Spent:  60 mins    start time: 1000    end time: 1100  Treatment Type: Individual Therapy  Reported Symptoms: Pt presents in person in the office, granting consent for the session.  Mental Status Exam: Appearance:  Casual     Behavior: Appropriate  Motor: Normal  Speech/Language:  Clear and Coherent  Affect: Appropriate  Mood: normal  Thought process: normal  Thought content:   WNL  Sensory/Perceptual disturbances:   WNL  Orientation: oriented to person, place, and time/date  Attention: Good  Concentration: Good  Memory: WNL  Fund of knowledge:  Good  Insight:   Good  Judgment:  Good  Impulse Control: Good   Risk Assessment: Danger to Self:  No Self-injurious Behavior: No Danger to Others: No Duty to Warn:no Physical Aggression / Violence:No  Access to Firearms a concern: No  Gang Involvement:No   Subjective: Pt shares that "I have been doing OK since our last session.  I was a bit concerned this morning because my medicine was not kicking in and I thought I wouldn't be able to drive over here today.  Sunday I wanted to go to church but my medicine did not kick in in time; I thought I was not going to be able to make it to lunch with [name]'s parent's that day.  I called [name] and he came over and helped me get dressed and then my medicine started working and I was able to go and enjoy the lunch."  Pt shares that he enjoys [name]'s parents and the time they share together.  [name] suggested to pt on Sunday that he should get his name on the list at [facility] so that, when he needs care he will have the option available to him.  Pt has been thinking about his future and how his Parkinson's will play into that; he knows that [name] is willing to help him but he knows that [name] is not the person he needs to be taking care of pt's need for food  prep; "[name] does not need to be the person in the kitchen cooking for me."  Pt is very concerned about "what is going on in the [employer]."  Pt shares that he feels like Trump is attempting to separate people form each other and create groups that will not like each other.  Pt continues to use exercised classes at [facility]; he has also bee mindful to walk more during the recent good weather days.  Pt shares that he has been snacking more lately and he believes that is due to his Parkinson's disease.  He is not sleeping well of late; "I did not sleep at all last night."  He intends to send his neurologist a MyChart message about his sleep; his next visit with his doctor is not until March.  Pt shares his sister, [name], may choose to come visit in March and May.  Encouraged pt to continue with his self care activities and we will meet next week for a follow up session.  Interventions: Cognitive Behavioral Therapy  Diagnosis:Adjustment disorder with depressed mood  Plan: Treatment Plan Strengths/Abilities:  Intelligent, Intuitive, Willing to participate in therapy Treatment Preferences:  Outpatient Individual Therapy Statement of Needs:  Patient is to use CBT, mindfulness and coping skills to help manage and/or decrease symptoms associated with their diagnosis. Symptoms:  Depressed/Irritable mood,  worry, social withdrawal Problems Addressed:  Depressive thoughts, Sadness, Sleep issues, etc. Long Term Goals:  Pt to reduce overall level, frequency, and intensity of the feelings of depression as evidenced by decreased irritability, negative self talk, and helpless feelings from 6 to 7 days/week to 0 to 1 days/week, per client report, for at least 3 consecutive months.  Progress: 30% Short Term Goals:  Pt to verbally express understanding of the relationship between feelings of depression and their impact on thinking patterns and behaviors.  Pt to verbalize an understanding of the role that distorted  thinking plays in creating fears, excessive worry, and ruminations.  Progress: 30% Target Date:  07/26/2023 Frequency:  Bi-weekly Modality:  Cognitive Behavioral Therapy Interventions by Therapist:  Therapist will use CBT, Mindfulness exercises, Coping skills and Referrals, as needed by client. Client has verbally approved this treatment plan.  Karie Kirks, [facility]

## 2023-03-09 ENCOUNTER — Ambulatory Visit: Payer: Medicare Other | Admitting: Psychology

## 2023-03-09 DIAGNOSIS — F4321 Adjustment disorder with depressed mood: Secondary | ICD-10-CM

## 2023-03-09 NOTE — Progress Notes (Signed)
 [facility] Counselor/Therapist Progress Note  Patient ID: Dan [name], MRN: [id number],    Date: 02/28/2023  Time Spent:  60 mins    start time: 1100    end time: 1200  Treatment Type: Individual Therapy  Reported Symptoms: Pt presents in person in the office, granting consent for the session.  Mental Status Exam: Appearance:  Casual     Behavior: Appropriate  Motor: Normal  Speech/Language:  Clear and Coherent  Affect: Appropriate  Mood: normal  Thought process: normal  Thought content:   WNL  Sensory/Perceptual disturbances:   WNL  Orientation: oriented to person, place, and time/date  Attention: Good  Concentration: Good  Memory: WNL  Fund of knowledge:  Good  Insight:   Good  Judgment:  Good  Impulse Control: Good   Risk Assessment: Danger to Self:  No Self-injurious Behavior: No Danger to Others: No Duty to Warn:no Physical Aggression / Violence:No  Access to Firearms a concern: No  Gang Involvement:No   Subjective: Pt shares that "I have been doing OK since our last session.  I have decided that I want to get more exercise back into my life.  I couldn't really do it during my radiation treatment or my recovery period."  Pt shares a friend told him to talk with his urologist about exercising more.  Friend also encouraged pt to bring [name] into the conversations.  Pt believes that [name] will likely be uncomfortable talking about his health conditions.  Pt shares that he continues to look after his mom as well; she is doing OK at this time.  Pt shares the neurologist is now appealing the insurance companies decision against letting him have the medication pump.   Pt is also struggling with the current political climate going on in our country at this time.  Pt shares his sister [name]) is coming back to visit on 3/27 and he is looking forward to that visit.  Encouraged pt to continue with his self care activities and we will meet next week for a follow  up session.  Interventions: Cognitive Behavioral Therapy  Diagnosis:Adjustment disorder with depressed mood  Plan: Treatment Plan Strengths/Abilities:  Intelligent, Intuitive, Willing to participate in therapy Treatment Preferences:  Outpatient Individual Therapy Statement of Needs:  Patient is to use CBT, mindfulness and coping skills to help manage and/or decrease symptoms associated with their diagnosis. Symptoms:  Depressed/Irritable mood, worry, social withdrawal Problems Addressed:  Depressive thoughts, Sadness, Sleep issues, etc. Long Term Goals:  Pt to reduce overall level, frequency, and intensity of the feelings of depression as evidenced by decreased irritability, negative self talk, and helpless feelings from 6 to 7 days/week to 0 to 1 days/week, per client report, for at least 3 consecutive months.  Progress: 30% Short Term Goals:  Pt to verbally express understanding of the relationship between feelings of depression and their impact on thinking patterns and behaviors.  Pt to verbalize an understanding of the role that distorted thinking plays in creating fears, excessive worry, and ruminations.  Progress: 30% Target Date:  07/26/2023 Frequency:  Bi-weekly Modality:  Cognitive Behavioral Therapy Interventions by Therapist:  Therapist will use CBT, Mindfulness exercises, Coping skills and Referrals, as needed by client. Client has verbally approved this treatment plan.  Karie Kirks, [facility]

## 2023-03-14 ENCOUNTER — Ambulatory Visit: Payer: Medicare Other | Admitting: Psychology

## 2023-03-16 DIAGNOSIS — G20B2 Parkinson's disease with dyskinesia, with fluctuations: Secondary | ICD-10-CM | POA: Diagnosis not present

## 2023-03-21 ENCOUNTER — Ambulatory Visit: Payer: Medicare Other | Admitting: Psychology

## 2023-03-21 DIAGNOSIS — F4321 Adjustment disorder with depressed mood: Secondary | ICD-10-CM | POA: Diagnosis not present

## 2023-03-21 NOTE — Progress Notes (Signed)
 [facility] Counselor/Therapist Progress Note  Patient ID: Dan [name], MRN: [id number],    Date: 02/28/2023  Time Spent:  60 mins    start time: 1000    end time: 1100  Treatment Type: Individual Therapy  Reported Symptoms: Pt presents in person in the office, granting consent for the session.  Mental Status Exam: Appearance:  Casual     Behavior: Appropriate  Motor: Normal  Speech/Language:  Clear and Coherent  Affect: Appropriate  Mood: normal  Thought process: normal  Thought content:   WNL  Sensory/Perceptual disturbances:   WNL  Orientation: oriented to person, place, and time/date  Attention: Good  Concentration: Good  Memory: WNL  Fund of knowledge:  Good  Insight:   Good  Judgment:  Good  Impulse Control: Good   Risk Assessment: Danger to Self:  No Self-injurious Behavior: No Danger to Others: No Duty to Warn:no Physical Aggression / Violence:No  Access to Firearms a concern: No  Gang Involvement:No   Subjective: Pt shares that "I have been doing OK since our last session.  I am so sorry for missing our session last week.  I got stuck at the car dealership having service done on my car.  I did see the neurologist at [facility] last week and I got a new prescription; it is the same medications but a different formula and dosage.  I am on it for the second day today and I slept well last night."  Pt shares that he has still not been approved for the pump to administer the new medication he would like to be on; it is frustrating for pt and the neurologist.  Pt continues to care for his mom; he is aware that his mom is on the verge of needing assisted living.  He sees that happening regularly when he is there.  Pt shares that [name] is coming to visit next week (3/27) and pt is excited to see her and have her help with their mom.  Talked with pt about how is Parkinson's started with him; he first started having symptoms about 8-9 yrs ago.  Pt shares he went  to neurologist and they weighed him and he is 20 pounds heavier than he wants to weigh.  He attributes it to having stopped exercising during his radiation due to fatigue.  He wants to get back into his classes at [facility] and will start back today and tomorrow.  Pt shares he is troubled by the things happening in [place] but is not willing to let his concerns take over his perspective.  Pt shares that he has good energy at this point in his life and feels good about the direction of his life.   Encouraged pt to continue with his self care activities and we will meet next week for a follow up session.  Interventions: Cognitive Behavioral Therapy  Diagnosis:Adjustment disorder with depressed mood  Plan: Treatment Plan Strengths/Abilities:  Intelligent, Intuitive, Willing to participate in therapy Treatment Preferences:  Outpatient Individual Therapy Statement of Needs:  Patient is to use CBT, mindfulness and coping skills to help manage and/or decrease symptoms associated with their diagnosis. Symptoms:  Depressed/Irritable mood, worry, social withdrawal Problems Addressed:  Depressive thoughts, Sadness, Sleep issues, etc. Long Term Goals:  Pt to reduce overall level, frequency, and intensity of the feelings of depression as evidenced by decreased irritability, negative self talk, and helpless feelings from 6 to 7 days/week to 0 to 1 days/week, per client report, for at least  3 consecutive months.  Progress: 30% Short Term Goals:  Pt to verbally express understanding of the relationship between feelings of depression and their impact on thinking patterns and behaviors.  Pt to verbalize an understanding of the role that distorted thinking plays in creating fears, excessive worry, and ruminations.  Progress: 30% Target Date:  07/26/2023 Frequency:  Bi-weekly Modality:  Cognitive Behavioral Therapy Interventions by Therapist:  Therapist will use CBT, Mindfulness exercises, Coping skills and  Referrals, as needed by client. Client has verbally approved this treatment plan.  Karie Kirks, [place]

## 2023-03-28 ENCOUNTER — Ambulatory Visit: Payer: Medicare Other | Admitting: Psychology

## 2023-03-28 DIAGNOSIS — F4321 Adjustment disorder with depressed mood: Secondary | ICD-10-CM

## 2023-03-28 NOTE — Progress Notes (Signed)
 [facility] Counselor/Therapist Progress Note  Patient ID: Dan [name], MRN: [id number],    Date: 02/28/2023  Time Spent:  60 mins    start time: 1000    end time: 1100  Treatment Type: Individual Therapy  Reported Symptoms: Pt presents in person in the office, granting consent for the session.  Mental Status Exam: Appearance:  Casual     Behavior: Appropriate  Motor: Normal  Speech/Language:  Clear and Coherent  Affect: Appropriate  Mood: normal  Thought process: normal  Thought content:   WNL  Sensory/Perceptual disturbances:   WNL  Orientation: oriented to person, place, and time/date  Attention: Good  Concentration: Good  Memory: WNL  Fund of knowledge:  Good  Insight:   Good  Judgment:  Good  Impulse Control: Good   Risk Assessment: Danger to Self:  No Self-injurious Behavior: No Danger to Others: No Duty to Warn:no Physical Aggression / Violence:No  Access to Firearms a concern: No  Gang Involvement:No   Subjective: Pt shares that "I have been doing OK since our last session.  I am not doing great on my new medication; it is not working well.  I just start shaking and I can't take of myself.  I am going to send a message to the neurologist today via MyChart.  To make it worse, this medicine is much more expensive and [employer] sent me a letter yesterday saying that this new medicine is not approved past the 30 day supply."  Pt is concerned about how all of this will play out.  He has still not been approved for the pump to administer another new medication.  Pt shares that he is afraid of what is coming for him because of these medication issues.  Pt's sister, [name], is coming on Thursday and he is really looking forward to her being here.  Pt shares that his "blue episodes have completely gone away.  They have not come around in several weeks."  Pt shares he has an appt with his urologist on Thursday.  Pt continues to go to [facility] for exercise classes.   Pt shares he continues to be troubled by the things happening in [place] but is not willing to let his concerns take over his perspective.  Pt shares that he has good energy at this point in his life and feels good about the direction of his life.  Encouraged pt to continue with his self care activities and we will meet next week for a follow up session.  Interventions: Cognitive Behavioral Therapy  Diagnosis:Adjustment disorder with depressed mood  Plan: Treatment Plan Strengths/Abilities:  Intelligent, Intuitive, Willing to participate in therapy Treatment Preferences:  Outpatient Individual Therapy Statement of Needs:  Patient is to use CBT, mindfulness and coping skills to help manage and/or decrease symptoms associated with their diagnosis. Symptoms:  Depressed/Irritable mood, worry, social withdrawal Problems Addressed:  Depressive thoughts, Sadness, Sleep issues, etc. Long Term Goals:  Pt to reduce overall level, frequency, and intensity of the feelings of depression as evidenced by decreased irritability, negative self talk, and helpless feelings from 6 to 7 days/week to 0 to 1 days/week, per client report, for at least 3 consecutive months.  Progress: 30% Short Term Goals:  Pt to verbally express understanding of the relationship between feelings of depression and their impact on thinking patterns and behaviors.  Pt to verbalize an understanding of the role that distorted thinking plays in creating fears, excessive worry, and ruminations.  Progress: 30% Target Date:  07/26/2023 Frequency:  Bi-weekly Modality:  Cognitive Behavioral Therapy Interventions by Therapist:  Therapist will use CBT, Mindfulness exercises, Coping skills and Referrals, as needed by client. Client has verbally approved this treatment plan.  Karie Kirks, [facility]

## 2023-03-30 DIAGNOSIS — C61 Malignant neoplasm of prostate: Secondary | ICD-10-CM | POA: Diagnosis not present

## 2023-03-30 DIAGNOSIS — G20A1 Parkinson's disease without dyskinesia, without mention of fluctuations: Secondary | ICD-10-CM | POA: Diagnosis not present

## 2023-03-30 DIAGNOSIS — Z8042 Family history of malignant neoplasm of prostate: Secondary | ICD-10-CM | POA: Diagnosis not present

## 2023-03-30 DIAGNOSIS — R972 Elevated prostate specific antigen [PSA]: Secondary | ICD-10-CM | POA: Diagnosis not present

## 2023-04-04 ENCOUNTER — Ambulatory Visit: Payer: Medicare Other | Admitting: Psychology

## 2023-04-04 DIAGNOSIS — F4321 Adjustment disorder with depressed mood: Secondary | ICD-10-CM | POA: Diagnosis not present

## 2023-04-04 NOTE — Progress Notes (Signed)
 [facility] Counselor/Therapist Progress Note  Patient ID: VERTIS SCHEIB, MRN: [id number],    Date: 04/04/2023  Time Spent:  60 mins    start time: 1300    end time: 1400  Treatment Type: Individual Therapy  Reported Symptoms: Pt presents in person in the office, granting consent for the session.  Mental Status Exam: Appearance:  Casual     Behavior: Appropriate  Motor: Normal  Speech/Language:  Clear and Coherent  Affect: Appropriate  Mood: normal  Thought process: normal  Thought content:   WNL  Sensory/Perceptual disturbances:   WNL  Orientation: oriented to person, place, and time/date  Attention: Good  Concentration: Good  Memory: WNL  Fund of knowledge:  Good  Insight:   Good  Judgment:  Good  Impulse Control: Good   Risk Assessment: Danger to Self:  No Self-injurious Behavior: No Danger to Others: No Duty to Warn:no Physical Aggression / Violence:No  Access to Firearms a concern: No  Gang Involvement:No   Subjective: Pt shares that "I have been struggling with my medications since last week and that has been hard.  I got good news from [facility] that they have approved the new medicine and the pump.  I should have it by the end of this week. My sister, [name], was here and left yesterday.  We had a nice visit and she was very helpful with our mom.  We talked with my mom about the option of having someone come into my mom's apt to help her out because I am not quite so able anymore."  Pt shares that he got upset because she was saying that she did not need assistance and could do things for herself.  They were trying to reason with her and pt finally got upset with her and told his mom that he is struggling with his Parkinson's and that he was treated for cancer.  His mom got dramatic about his cancer treatment which is why pt did not tell her about his cancer before his treatment was over.  Pt shares that he is hopeful that the new medication and the pump  will give him greater control over his symptoms and his life.  He is also aware that deep brain stimulation is an option for him as a possible next step.  Pt did see his urologist and that visit went well.  Pt shares that he has gained some weight since his radiation treatment so he is trying to be more intentional.  Pt continues to go to [name] for exercise classes.  Encouraged pt to continue with his self care activities and we will meet in 3 weeks for a follow up session, after my vacation.  Interventions: Cognitive Behavioral Therapy  Diagnosis:Adjustment disorder with depressed mood  Plan: Treatment Plan Strengths/Abilities:  Intelligent, Intuitive, Willing to participate in therapy Treatment Preferences:  Outpatient Individual Therapy Statement of Needs:  Patient is to use CBT, mindfulness and coping skills to help manage and/or decrease symptoms associated with their diagnosis. Symptoms:  Depressed/Irritable mood, worry, social withdrawal Problems Addressed:  Depressive thoughts, Sadness, Sleep issues, etc. Long Term Goals:  Pt to reduce overall level, frequency, and intensity of the feelings of depression as evidenced by decreased irritability, negative self talk, and helpless feelings from 6 to 7 days/week to 0 to 1 days/week, per client report, for at least 3 consecutive months.  Progress: 30% Short Term Goals:  Pt to verbally express understanding of the relationship between feelings of depression and their  impact on thinking patterns and behaviors.  Pt to verbalize an understanding of the role that distorted thinking plays in creating fears, excessive worry, and ruminations.  Progress: 30% Target Date:  07/26/2023 Frequency:  Bi-weekly Modality:  Cognitive Behavioral Therapy Interventions by Therapist:  Therapist will use CBT, Mindfulness exercises, Coping skills and Referrals, as needed by client. Client has verbally approved this treatment plan.  Karie Kirks, [facility]

## 2023-04-13 ENCOUNTER — Telehealth: Payer: Self-pay

## 2023-04-13 NOTE — Telephone Encounter (Signed)
 Pt need ov to discuss this? Last ov 12/15/22.  Copied from CRM [id number]. Topic: Clinical - Medication Question >> Apr 13, 2023  3:27 PM [place] E wrote: Reason for CRM: Patient stated that he is part of a Parkinson's group and he was discussing how he has troubles sleeping. One of his classmates stated how they are on Klonopin and it helps them. Patient is questioning if he can get started on this medication to help his sleep.

## 2023-04-13 NOTE — Telephone Encounter (Signed)
 Although this medication may help sleep it also increases fall risk-I would not recommend this medicine for him

## 2023-04-14 NOTE — Telephone Encounter (Signed)
 Called and lm on pt vm with below message.

## 2023-04-25 ENCOUNTER — Ambulatory Visit: Admitting: Psychology

## 2023-04-25 DIAGNOSIS — F4321 Adjustment disorder with depressed mood: Secondary | ICD-10-CM

## 2023-04-25 NOTE — Progress Notes (Signed)
 [facility] Counselor/Therapist Progress Note  Patient ID: Dan [name], MRN: [id number],    Date: 04/25/2023  Time Spent:  45 mins    start time: 1000    end time: 1045  Treatment Type: Individual Therapy  Reported Symptoms: Pt presents in person in the office, granting consent for the session.  Mental Status Exam: Appearance:  Casual     Behavior: Appropriate  Motor: Normal  Speech/Language:  Clear and Coherent  Affect: Appropriate  Mood: normal  Thought process: normal  Thought content:   WNL  Sensory/Perceptual disturbances:   WNL  Orientation: oriented to person, place, and time/date  Attention: Good  Concentration: Good  Memory: WNL  Fund of knowledge:  Good  Insight:   Good  Judgment:  Good  Impulse Control: Good   Risk Assessment: Danger to Self:  No Self-injurious Behavior: No Danger to Others: No Duty to Warn:no Physical Aggression / Violence:No  Access to Firearms a concern: No  Gang Involvement:No   Subjective: Pt shares that "I will give you and update; I have found an aide to help with my mom and yesterday was the first day; we are starting with Monday and Friday; we will likely go to Monday, Wednesday, and Friday.  I am on a new medication and it is not working so well; I am having to manipulate the times I take my medicine to enable me to get places that I need to go.  I feel like I am in decline right now; I am getting concerned.  I am not sleeping well; I can nap during the day and I have to do that to get through the day.  I am having to think through everything that I have to do in my day and I have to plan everything out.  Dan [name] is going to [place] on May 3rd and will be gone for about 10 days.  I am a little concerned about being by myself for that time."  Pt shares he has not seen his neurologist (Dr. Merlyn Starring) in some time.  He is planning to call and try to get his next appt moved up.  He does not feel like this new medicine is working for  him.  Pt shares that he did get approved for the new pump and the new medicine but he has not gotten either one because Medicare has not negotiated with either company yet.  Pt shares Dan [name] is coming back for Mother's Day and his brother Dan [name] is talking about coming in late May, around their Mom's birthday (5/27).  Pt shares that, "the whole time you were on your vacation I was consumed with Parkinson's related health issues."  Pt shares that on Easter Sunday, Dan [name] suggested that pt not drive and pt is beginning to think about the impact of that on his life.  Pt is aware "that this pump could change my life, but I just don't have it yet."  Pt shares he has been sleeping in his recliner downstairs because he got too uncomfortable laying flat in his bed.  He is concerned about all of these changes for him.  His friend, Dan [name], has offered to share here cleaning lady with pt and he is afraid that that time is coming sooner rather than later.  Pt has not bee able to go to [name] for exercise classes since our last session because he has not been sleeping well.  Pt feels good about his decision to get help for his  mom but is not sure if this person is going to be the best fit.  He will give it a try to see how it works out.  Encouraged pt to continue with his self care activities and we will meet next week for a follow up session.  Interventions: Cognitive Behavioral Therapy  Diagnosis:Adjustment disorder with depressed mood  Plan: Treatment Plan Strengths/Abilities:  Intelligent, Intuitive, Willing to participate in therapy Treatment Preferences:  Outpatient Individual Therapy Statement of Needs:  Patient is to use CBT, mindfulness and coping skills to help manage and/or decrease symptoms associated with their diagnosis. Symptoms:  Depressed/Irritable mood, worry, social withdrawal Problems Addressed:  Depressive thoughts, Sadness, Sleep issues, etc. Long Term Goals:  Pt to reduce overall level,  frequency, and intensity of the feelings of depression as evidenced by decreased irritability, negative self talk, and helpless feelings from 6 to 7 days/week to 0 to 1 days/week, per client report, for at least 3 consecutive months.  Progress: 30% Short Term Goals:  Pt to verbally express understanding of the relationship between feelings of depression and their impact on thinking patterns and behaviors.  Pt to verbalize an understanding of the role that distorted thinking plays in creating fears, excessive worry, and ruminations.  Progress: 30% Target Date:  07/26/2023 Frequency:  Bi-weekly Modality:  Cognitive Behavioral Therapy Interventions by Therapist:  Therapist will use CBT, Mindfulness exercises, Coping skills and Referrals, as needed by client. Client has verbally approved this treatment plan.  Dan [name], [facility]

## 2023-05-02 ENCOUNTER — Ambulatory Visit (INDEPENDENT_AMBULATORY_CARE_PROVIDER_SITE_OTHER): Admitting: Psychology

## 2023-05-02 DIAGNOSIS — F4321 Adjustment disorder with depressed mood: Secondary | ICD-10-CM | POA: Diagnosis not present

## 2023-05-02 NOTE — Progress Notes (Signed)
 Dan [name]/Therapist Progress Note  Patient ID: Dan [name], MRN: [id number],    Date: 04/25/2023  Time Spent:  60 mins    start time: 1000    end time: 1100  Treatment Type: Individual Therapy  Reported Symptoms: Pt presents in person in the office, granting consent for the session.  Mental Status Exam: Appearance:  Casual     Behavior: Appropriate  Motor: Normal  Speech/Language:  Clear and Coherent  Affect: Appropriate  Mood: normal  Thought process: normal  Thought content:   WNL  Sensory/Perceptual disturbances:   WNL  Orientation: oriented to person, place, and time/date  Attention: Good  Concentration: Good  Memory: WNL  Fund of knowledge:  Good  Insight:   Good  Judgment:  Good  Impulse Control: Good   Risk Assessment: Danger to Self:  No Self-injurious Behavior: No Danger to Others: No Duty to Warn:no Physical Aggression / Violence:No  Access to Firearms a concern: No  Gang Involvement:No   Subjective: Pt shares that "It has been a tough week.  My mom is giving me some push back about having the health aide coming in to be with her for two days per week, for four hours each day.  I think one of her biggest issues is the money to pay for the help.  I took my mom to a drumming class last week at [facility] and we both enjoyed it."  Pt shares that he continues to have issues with his medications not working as well as he wants them to work.  He is having to sleep on the first floor of his town home because it it too hard for him to be upstairs and plan for everything that he needs upstairs with him at home.  Pt shares he continues to exercise at home to keep his joints as loose as he can.  He has an appt with [name] on 6/26.  Pt has had no word on the pump that he is waiting for.  He has to plan his activities around whenever his medication is working well for him; he has to use those times to get things done that are important to  him.  Pt had to call Dan [name] this morning to come help him get dressed so he could plan to come to our session.  Dan [name] is coming on 5/8 for a Mother's Day visit  and his brother Dan [name] is coming on 5/28 for mom's birthday.  Pt shares that he has been sleeping better of late at night and he also tends to take a nap during the day too. Pt continues to drive and yet he knows that he will likely lose that privilege at some point.    Dan [name] is going to [place] on May 8th and will be gone for about 10 days.  Dan [name] will be here for part of Dan [name]'s absence and he will make plans for the other days Dan [name] will be gone.  Encouraged pt to continue with his self care activities and we will meet next week for a follow up session.  Interventions: Cognitive Behavioral Therapy  Diagnosis:Adjustment disorder with depressed mood  Plan: Treatment Plan Strengths/Abilities:  Intelligent, Intuitive, Willing to participate in therapy Treatment Preferences:  Outpatient Individual Therapy Statement of Needs:  Patient is to use CBT, mindfulness and coping skills to help manage and/or decrease symptoms associated with their diagnosis. Symptoms:  Depressed/Irritable mood, worry, social withdrawal Problems Addressed:  Depressive thoughts, Sadness, Sleep issues, etc.  Long Term Goals:  Pt to reduce overall level, frequency, and intensity of the feelings of depression as evidenced by decreased irritability, negative self talk, and helpless feelings from 6 to 7 days/week to 0 to 1 days/week, per client report, for at least 3 consecutive months.  Progress: 30% Short Term Goals:  Pt to verbally express understanding of the relationship between feelings of depression and their impact on thinking patterns and behaviors.  Pt to verbalize an understanding of the role that distorted thinking plays in creating fears, excessive worry, and ruminations.  Progress: 30% Target Date:  07/26/2023 Frequency:  Bi-weekly Modality:  Cognitive Behavioral  Therapy Interventions by Therapist:  Therapist will use CBT, Mindfulness exercises, Coping skills and Referrals, as needed by client. Client has verbally approved this treatment plan.  Dan [name], [facility]

## 2023-05-11 ENCOUNTER — Ambulatory Visit: Admitting: Psychology

## 2023-05-16 ENCOUNTER — Ambulatory Visit (INDEPENDENT_AMBULATORY_CARE_PROVIDER_SITE_OTHER): Admitting: Psychology

## 2023-05-16 DIAGNOSIS — F4321 Adjustment disorder with depressed mood: Secondary | ICD-10-CM | POA: Diagnosis not present

## 2023-05-16 NOTE — Progress Notes (Signed)
 [facility] Counselor/Therapist Progress Note  Patient ID: Dan [name], MRN: [id number],    Date: 04/25/2023  Time Spent:  60 mins    start time: 1000    end time: 1100  Treatment Type: Individual Therapy  Reported Symptoms: Pt presents in person in the office, granting consent for the session.  Mental Status Exam: Appearance:  Casual     Behavior: Appropriate  Motor: Normal  Speech/Language:  Clear and Coherent  Affect: Appropriate  Mood: normal  Thought process: normal  Thought content:   WNL  Sensory/Perceptual disturbances:   WNL  Orientation: oriented to person, place, and time/date  Attention: Good  Concentration: Good  Memory: WNL  Fund of knowledge:  Good  Insight:   Good  Judgment:  Good  Impulse Control: Good   Risk Assessment: Danger to Self:  No Self-injurious Behavior: No Danger to Others: No Duty to Warn:no Physical Aggression / Violence:No  Access to Firearms a concern: No  Gang Involvement:No   Subjective: Pt shares that "I have been having a really hard time with my symptoms.  I am really going down quickly and I am so glad to have [place] here.  On Mother's Day, I could not even pick up my fork for dessert.  Also, we have decided to place my mom in assisted living at [facility] due to my decline and her's as well."  Pt shares he is very concerned about his own health care needs at this time; his sister, Dan [name], extended her stay at least an additional week.  Pt's brother Dan [name] is still coming for a visit at the end of May and he has called his brother Dan [name] and told him that he needs to be aware that their mom is going to Assisted Living and he is planning to come visit soon as well.  Pt shares that he health is up and down; "I woke up at 3am the other night and stood right up and walked to the bathroom with no assistance; that was weird that I could do that with not medicine and not walker."  Pt "has been reflecting on the benefits of  my life and there were lots of benefits."  Pt shares he is so glad that Dan [name] is here and she will be here to Sunday.  She has been so helpful for pt.  Pt has still not gotten an update on his pump yet.  Pt shares that Dan [name] is in [place] and will be back a week from tomorrow (5/21).  Pt was not able to drive himself to our session today; Dan [name] drove him and he appreciated that.  Pt continues to sleep on the first floor of his home for now.  Encouraged pt to continue with his self care activities and we will meet next week for a follow up session.  Interventions: Cognitive Behavioral Therapy  Diagnosis:Adjustment disorder with depressed mood  Plan: Treatment Plan Strengths/Abilities:  Intelligent, Intuitive, Willing to participate in therapy Treatment Preferences:  Outpatient Individual Therapy Statement of Needs:  Patient is to use CBT, mindfulness and coping skills to help manage and/or decrease symptoms associated with their diagnosis. Symptoms:  Depressed/Irritable mood, worry, social withdrawal Problems Addressed:  Depressive thoughts, Sadness, Sleep issues, etc. Long Term Goals:  Pt to reduce overall level, frequency, and intensity of the feelings of depression as evidenced by decreased irritability, negative self talk, and helpless feelings from 6 to 7 days/week to 0 to 1 days/week, per client report, for at least  3 consecutive months.  Progress: 30% Short Term Goals:  Pt to verbally express understanding of the relationship between feelings of depression and their impact on thinking patterns and behaviors.  Pt to verbalize an understanding of the role that distorted thinking plays in creating fears, excessive worry, and ruminations.  Progress: 30% Target Date:  07/26/2023 Frequency:  Bi-weekly Modality:  Cognitive Behavioral Therapy Interventions by Therapist:  Therapist will use CBT, Mindfulness exercises, Coping skills and Referrals, as needed by client. Client has verbally approved this  treatment plan.  Dan [name], [facility]

## 2023-05-23 ENCOUNTER — Ambulatory Visit: Admitting: Psychology

## 2023-05-30 ENCOUNTER — Ambulatory Visit: Admitting: Psychology

## 2023-05-30 DIAGNOSIS — F4321 Adjustment disorder with depressed mood: Secondary | ICD-10-CM

## 2023-05-30 NOTE — Progress Notes (Signed)
 [facility] Counselor/Therapist Progress Note  Patient ID: Dan [name], MRN: [id number],    Date: 04/25/2023  Time Spent:  60 mins    start time: 1000    end time: 1100  Treatment Type: Individual Therapy  Reported Symptoms: Pt presents in person in the office, granting consent for the session.  Mental Status Exam: Appearance:  Casual     Behavior: Appropriate  Motor: Normal  Speech/Language:  Clear and Coherent  Affect: Appropriate  Mood: normal  Thought process: normal  Thought content:   WNL  Sensory/Perceptual disturbances:   WNL  Orientation: oriented to person, place, and time/date  Attention: Good  Concentration: Good  Memory: WNL  Fund of knowledge:  Good  Insight:   Good  Judgment:  Good  Impulse Control: Good   Risk Assessment: Danger to Self:  No Self-injurious Behavior: No Danger to Others: No Duty to Warn:no Physical Aggression / Violence:No  Access to Firearms a concern: No  Gang Involvement:No   Subjective: Pt shares that "I have continued having a really hard time with my symptoms.  I am still really going down quickly.  We have transferred my mom to assisted living; today is her birthday and she is 68 yo.  We are going to lunch at [facility] and then back to my home for cake and coffee.  Pt shares that his mom has a friend in assisted living all ready.  Pt shares that his brothers are here and his brother's partner are here for a visit and is staying at the apt at [facility].  They are all going to lunch today to celebrate her birthday.  Pt shares that the [employer] is coming on Wednesday to get the rest of his mom's furniture.  He paid the rent through the end of the month so they have time to get it emptied and cleaned.  Pt shares that he has had to get himself help [facility]) for when his medication is not working.  Dan [name] at 5am and gives him his first dose of medication and gets him his breakfast.  Dan [name] is willing to do  anything that pt needs and pt truly appreciates his help.  Dan [name] stays until 9am and is a great benefit for pt.  Pt is meeting with his financial advisor to see what his next steps might need to be.  Dan [name] is leaving tomorrow and Dan [name] and his partner drove so they will stay a bit longer.  Dan [name] continues to be anxious and all of this change for pt and his mom has been hard on him too.  Pt can only take 6 doses of medication per day and he has to take it every 2-3 hours and he has to be ready for bed by 6-7 pm.  Pt shares that Dan [name] had a great time in [place] on his vacation.  Dan [name], pt, and a couple of their neighbors are planning to go to [place] for vacation sometime this summer.  Dan [name] has encouraged pt to get his name on the list for a room at [facility] and pt wil be doing that soon.  Pt has an appt with his neurologist (Dr. Merlyn [name]) on June 26 at 230p and hopes to get an update on the pump and the medication.  His follow up with the cancer doctor in July.  Pt continues to sleep on the first floor of his home for now.  Encouraged pt to continue with his self care activities and  we will meet next week for a follow up session.  Interventions: Cognitive Behavioral Therapy  Diagnosis:Adjustment disorder with depressed mood  Plan: Treatment Plan Strengths/Abilities:  Intelligent, Intuitive, Willing to participate in therapy Treatment Preferences:  Outpatient Individual Therapy Statement of Needs:  Patient is to use CBT, mindfulness and coping skills to help manage and/or decrease symptoms associated with their diagnosis. Symptoms:  Depressed/Irritable mood, worry, social withdrawal Problems Addressed:  Depressive thoughts, Sadness, Sleep issues, etc. Long Term Goals:  Pt to reduce overall level, frequency, and intensity of the feelings of depression as evidenced by decreased irritability, negative self talk, and helpless feelings from 6 to 7 days/week to 0 to 1 days/week, per client report, for at least 3  consecutive months.  Progress: 30% Short Term Goals:  Pt to verbally express understanding of the relationship between feelings of depression and their impact on thinking patterns and behaviors.  Pt to verbalize an understanding of the role that distorted thinking plays in creating fears, excessive worry, and ruminations.  Progress: 30% Target Date:  07/26/2023 Frequency:  Bi-weekly Modality:  Cognitive Behavioral Therapy Interventions by Therapist:  Therapist will use CBT, Mindfulness exercises, Coping skills and Referrals, as needed by client. Client has verbally approved this treatment plan.  Dan [name], [facility]

## 2023-06-08 ENCOUNTER — Ambulatory Visit: Admitting: Psychology

## 2023-06-08 DIAGNOSIS — F4321 Adjustment disorder with depressed mood: Secondary | ICD-10-CM

## 2023-06-08 NOTE — Progress Notes (Signed)
 [facility] Counselor/Therapist Progress Note  Patient ID: Dan [name], MRN: [id number],    Date: 04/25/2023  Time Spent:  45 mins    start time: 1100    end time: 1145  Treatment Type: Individual Therapy  Reported Symptoms: Pt presents in person in the office, granting consent for the session.  Mental Status Exam: Appearance:  Casual     Behavior: Appropriate  Motor: Normal  Speech/Language:  Clear and Coherent  Affect: Appropriate  Mood: normal  Thought process: normal  Thought content:   WNL  Sensory/Perceptual disturbances:   WNL  Orientation: oriented to person, place, and time/date  Attention: Good  Concentration: Good  Memory: WNL  Fund of knowledge:  Good  Insight:   Good  Judgment:  Good  Impulse Control: Good   Risk Assessment: Danger to Self:  No Self-injurious Behavior: No Danger to Others: No Duty to Warn:no Physical Aggression / Violence:No  Access to Firearms a concern: No  Gang Involvement:No   Subjective: Pt shares that "I have continued having a really hard time with my symptoms.  Yesterday was a good day; my medicine worked well and on time.  Today was different; my medicine did not kick in well today.  It was not until 9am that my medicine kicked in today.  Pt shares that [name] is costing him $1000.00 per week and pt knows he needs to be careful about how much he is using him.  Pt and Dan [name] went to see pt's financial advisor to talk about what his resources are.  Pt is having no issues with his mom in the Assisted Living section at [facility].  Dan [name], pt, and a couple of their neighbors are planning to go to [name] for vacation sometime this summer.  Pt has an appt with his neurologist (Dr. Merlyn Starring) on June 26 at 230p and hopes to get an update on the pump and the medication.  His follow up with the cancer doctor in July.  Pt continues to sleep on the first floor of his home for now.  Pt shares that [name] is helping him out frequently  as well and pt appreciates that.  Pt shares that he is safe and feels appreciative of that as well.  Encouraged pt to continue with his self care activities and we will meet next week for a follow up session.  Interventions: Cognitive Behavioral Therapy  Diagnosis:Adjustment disorder with depressed mood  Plan: Treatment Plan Strengths/Abilities:  Intelligent, Intuitive, Willing to participate in therapy Treatment Preferences:  Outpatient Individual Therapy Statement of Needs:  Patient is to use CBT, mindfulness and coping skills to help manage and/or decrease symptoms associated with their diagnosis. Symptoms:  Depressed/Irritable mood, worry, social withdrawal Problems Addressed:  Depressive thoughts, Sadness, Sleep issues, etc. Long Term Goals:  Pt to reduce overall level, frequency, and intensity of the feelings of depression as evidenced by decreased irritability, negative self talk, and helpless feelings from 6 to 7 days/week to 0 to 1 days/week, per client report, for at least 3 consecutive months.  Progress: 30% Short Term Goals:  Pt to verbally express understanding of the relationship between feelings of depression and their impact on thinking patterns and behaviors.  Pt to verbalize an understanding of the role that distorted thinking plays in creating fears, excessive worry, and ruminations.  Progress: 30% Target Date:  07/26/2023 Frequency:  Bi-weekly Modality:  Cognitive Behavioral Therapy Interventions by Therapist:  Therapist will use CBT, Mindfulness exercises, Coping skills and Referrals, as  needed by client. Client has verbally approved this treatment plan.  [name], [facility]

## 2023-06-13 ENCOUNTER — Ambulatory Visit: Admitting: Psychology

## 2023-06-13 DIAGNOSIS — F4321 Adjustment disorder with depressed mood: Secondary | ICD-10-CM | POA: Diagnosis not present

## 2023-06-13 NOTE — Progress Notes (Signed)
 [name]  Patient ID: Dan [name], MRN: [id number],    Date: 04/25/2023  Time Spent:  60 mins    start time: 1000    end time: 1100  Treatment Type: Individual Therapy  Reported Symptoms: Pt presents in person in the office, granting consent for the session.  Mental Status Exam: Appearance:  Casual     Behavior: Appropriate  Motor: Normal  Speech/Language:  Clear and Coherent  Affect: Appropriate  Mood: normal  Thought process: normal  Thought content:   WNL  Sensory/Perceptual disturbances:   WNL  Orientation: oriented to person, place, and time/date  Attention: Good  Concentration: Good  Memory: WNL  Fund of knowledge:  Good  Insight:   Good  Judgment:  Good  Impulse Control: Good   Risk Assessment: Danger to Self:  No Self-injurious Behavior: No Danger to Others: No Duty to Warn:no Physical Aggression / Violence:No  Access to Firearms a concern: No  Gang Involvement:No   Subjective: Pt shares that "I have continued to struggle with my symptoms.  [name] has been very helpful to me.  Mom is settled in at the assisted living area of [name] and that is taking a lot of stress off of me."  Pt shares that he is "feeling wobbly today with the medicine."  Pt shares that he is noticing that [name] triggers him from time to time; [name] was starting to help pt get rid of some dishes and things from his large pantry in his [name]; it was triggering pt in the way [name] was helping.  Talked with pt to make sure this thinning out of items in his [name] is going at the pace pt is comfortable with.  Pt continues to want to pursue getting the pump and medication from his neurologist at [name].  Dan [name], pt, and a couple of their neighbors are planning to go to [name] for vacation sometime this summer.  Pt has an appt with his neurologist (Dan [name]) on June 26 at 230p and hopes to get an update on the pump and the medication.  His follow up  with the cancer doctor in July.  Pt continues to sleep on the first floor of his [name] for now.  Pt shares that he is safe and feels appreciative of that as well.  Encouraged pt to continue with his self care activities and we will meet next week for a follow up session.  Interventions: Cognitive Behavioral Therapy  Diagnosis:Adjustment disorder with depressed mood  Plan: Treatment Plan Strengths/Abilities:  Intelligent, Intuitive, Willing to participate in therapy Treatment Preferences:  Outpatient Individual Therapy Statement of Needs:  Patient is to use CBT, mindfulness and coping skills to help manage and/or decrease symptoms associated with their diagnosis. Symptoms:  Depressed/Irritable mood, worry, social withdrawal Problems Addressed:  Depressive thoughts, Sadness, Sleep issues, etc. Long Term Goals:  Pt to reduce overall level, frequency, and intensity of the feelings of depression as evidenced by decreased irritability, negative self talk, and helpless feelings from 6 to 7 days/week to 0 to 1 days/week, per client report, for at least 3 consecutive months.  Progress: 30% Short Term Goals:  Pt to verbally express understanding of the relationship between feelings of depression and their impact on thinking patterns and behaviors.  Pt to verbalize an understanding of the role that distorted thinking plays in creating fears, excessive worry, and ruminations.  Progress: 30% Target Date:  07/26/2023 Frequency:  Bi-weekly Modality:  Cognitive Behavioral Therapy Interventions  by Therapist:  Therapist will use CBT, Mindfulness exercises, Coping skills and Referrals, as needed by client. Client has verbally approved this treatment plan.  [name], [name]

## 2023-06-20 ENCOUNTER — Ambulatory Visit: Admitting: Psychology

## 2023-06-20 DIAGNOSIS — F4321 Adjustment disorder with depressed mood: Secondary | ICD-10-CM | POA: Diagnosis not present

## 2023-06-20 NOTE — Progress Notes (Signed)
 [name] Counselor/Therapist Progress Note  Patient ID: Dan [name], MRN: [id number],    Date: 06/20/2023  Time Spent:  60 mins    start time: 1000    end time: 1100  Treatment Type: Individual Therapy  Reported Symptoms: Pt presents in person in the office, granting consent for the session.  Mental Status Exam: Appearance:  Casual     Behavior: Appropriate  Motor: Normal  Speech/Language:  Clear and Coherent  Affect: Appropriate  Mood: normal  Thought process: normal  Thought content:   WNL  Sensory/Perceptual disturbances:   WNL  Orientation: oriented to person, place, and time/date  Attention: Good  Concentration: Good  Memory: WNL  Fund of knowledge:  Good  Insight:   Good  Judgment:  Good  Impulse Control: Good   Risk Assessment: Danger to Self:  No Self-injurious Behavior: No Danger to Others: No Duty to Warn:no Physical Aggression / Violence:No  Access to Firearms a concern: No  Gang Involvement:No   Subjective: Pt shares that I have continued to struggle with my symptoms.  I drove myself here today but I am still a little wobbly.  I did not sleep well last night.  Pt shares that he did get to the gym yesterday and worked out with his trainer and he enjoyed being able to go there.  Pt has talked with [name] about putting his name on the list at [facility] and he was supportive; Randell Bussing, and [name]'s parents are also supportive of him doing this as well.  He is aware of what the constraints are there in terms of buy costs and monthly rents.  [name]'s parents have put money down at [facility].  Pt is hopeful that he will be able to stay in his home for the next couple of years.  [name] is coming down in Sept to see she wants anything out of pt's apt; he will also have his brothers come and look as well.  Pt has his neurology follow up appt next Thursday, 6/26, with Dr. Merlyn Starring at [facility].  His mom is doing so well in assisted living; she has a  friend who lives in the room next to pt's mom.  Pt has been consolidating all of his mom's money into [employer].  [name], pt, and a couple of their neighbors are planning to go to [place] for vacation sometime this summer.  His follow up with the cancer doctor in July.  Pt continues to sleep on the first floor of his home for now.  Pt shares that he is safe and feels appreciative of that as well.  Encouraged pt to continue with his self care activities and we will meet next week for a follow up session.  Interventions: Cognitive Behavioral Therapy  Diagnosis:Adjustment disorder with depressed mood  Plan: Treatment Plan Strengths/Abilities:  Intelligent, Intuitive, Willing to participate in therapy Treatment Preferences:  Outpatient Individual Therapy Statement of Needs:  Patient is to use CBT, mindfulness and coping skills to help manage and/or decrease symptoms associated with their diagnosis. Symptoms:  Depressed/Irritable mood, worry, social withdrawal Problems Addressed:  Depressive thoughts, Sadness, Sleep issues, etc. Long Term Goals:  Pt to reduce overall level, frequency, and intensity of the feelings of depression as evidenced by decreased irritability, negative self talk, and helpless feelings from 6 to 7 days/week to 0 to 1 days/week, per client report, for at least 3 consecutive months.  Progress: 30% Short Term Goals:  Pt to verbally express understanding  of the relationship between feelings of depression and their impact on thinking patterns and behaviors.  Pt to verbalize an understanding of the role that distorted thinking plays in creating fears, excessive worry, and ruminations.  Progress: 30% Target Date:  07/26/2023 Frequency:  Bi-weekly Modality:  Cognitive Behavioral Therapy Interventions by Therapist:  Therapist will use CBT, Mindfulness exercises, Coping skills and Referrals, as needed by client. Client has verbally approved this treatment plan.  [name],  [facility]

## 2023-06-22 ENCOUNTER — Ambulatory Visit (INDEPENDENT_AMBULATORY_CARE_PROVIDER_SITE_OTHER): Payer: Medicare HMO | Admitting: Family Medicine

## 2023-06-22 ENCOUNTER — Ambulatory Visit: Payer: Self-pay | Admitting: Family Medicine

## 2023-06-22 ENCOUNTER — Encounter: Payer: Self-pay | Admitting: Family Medicine

## 2023-06-22 VITALS — BP 110/72 | HR 81 | Temp 98.4°F | Ht 75.0 in | Wt 207.2 lb

## 2023-06-22 DIAGNOSIS — E785 Hyperlipidemia, unspecified: Secondary | ICD-10-CM | POA: Diagnosis not present

## 2023-06-22 DIAGNOSIS — R7989 Other specified abnormal findings of blood chemistry: Secondary | ICD-10-CM | POA: Diagnosis not present

## 2023-06-22 DIAGNOSIS — Z0000 Encounter for general adult medical examination without abnormal findings: Secondary | ICD-10-CM

## 2023-06-22 DIAGNOSIS — G20B2 Parkinson's disease with dyskinesia, with fluctuations: Secondary | ICD-10-CM

## 2023-06-22 DIAGNOSIS — E663 Overweight: Secondary | ICD-10-CM | POA: Diagnosis not present

## 2023-06-22 DIAGNOSIS — Z131 Encounter for screening for diabetes mellitus: Secondary | ICD-10-CM | POA: Diagnosis not present

## 2023-06-22 LAB — CBC WITH DIFFERENTIAL/PLATELET
Basophils Absolute: 0 10*3/uL (ref 0.0–0.1)
Basophils Relative: 0.5 % (ref 0.0–3.0)
Eosinophils Absolute: 0.1 10*3/uL (ref 0.0–0.7)
Eosinophils Relative: 0.7 % (ref 0.0–5.0)
HCT: 42.7 % (ref 39.0–52.0)
Hemoglobin: 14.4 g/dL (ref 13.0–17.0)
Lymphocytes Relative: 16.3 % (ref 12.0–46.0)
Lymphs Abs: 1.5 10*3/uL (ref 0.7–4.0)
MCHC: 33.8 g/dL (ref 30.0–36.0)
MCV: 91.7 fl (ref 78.0–100.0)
Monocytes Absolute: 1 10*3/uL (ref 0.1–1.0)
Monocytes Relative: 11.1 % (ref 3.0–12.0)
Neutro Abs: 6.6 10*3/uL (ref 1.4–7.7)
Neutrophils Relative %: 71.4 % (ref 43.0–77.0)
Platelets: 258 10*3/uL (ref 150.0–400.0)
RBC: 4.66 Mil/uL (ref 4.22–5.81)
RDW: 13.7 % (ref 11.5–15.5)
WBC: 9.3 10*3/uL (ref 4.0–10.5)

## 2023-06-22 LAB — COMPREHENSIVE METABOLIC PANEL WITH GFR
ALT: 4 U/L (ref 0–53)
AST: 12 U/L (ref 0–37)
Albumin: 4.1 g/dL (ref 3.5–5.2)
Alkaline Phosphatase: 62 U/L (ref 39–117)
BUN: 16 mg/dL (ref 6–23)
CO2: 29 meq/L (ref 19–32)
Calcium: 9.7 mg/dL (ref 8.4–10.5)
Chloride: 100 meq/L (ref 96–112)
Creatinine, Ser: 0.71 mg/dL (ref 0.40–1.50)
GFR: 94.76 mL/min (ref >60.00)
Glucose, Bld: 92 mg/dL (ref 70–99)
Potassium: 4 meq/L (ref 3.5–5.1)
Sodium: 134 meq/L — ABNORMAL LOW (ref 135–145)
Total Bilirubin: 0.5 mg/dL (ref 0.2–1.2)
Total Protein: 7.2 g/dL (ref 6.0–8.3)

## 2023-06-22 LAB — VITAMIN D 25 HYDROXY (VIT D DEFICIENCY, FRACTURES): VITD: 30.37 ng/mL (ref 30.00–100.00)

## 2023-06-22 LAB — LIPID PANEL
Cholesterol: 164 mg/dL (ref 0–200)
HDL: 35.3 mg/dL — ABNORMAL LOW (ref >39.00)
LDL Cholesterol: 101 mg/dL — ABNORMAL HIGH (ref 0–99)
NonHDL: 128.79
Total CHOL/HDL Ratio: 5
Triglycerides: 137 mg/dL (ref 0.0–149.0)
VLDL: 27.4 mg/dL (ref 0.0–40.0)

## 2023-06-22 LAB — HEMOGLOBIN A1C: Hgb A1c MFr Bld: 6.2 % (ref 4.6–6.5)

## 2023-06-22 NOTE — Patient Instructions (Addendum)
 Health Maintenance Due  Topic Date Due   Colonoscopy  12/23/2022  [facility] contact- call them once you feel things are in a better spot with [name] Please call to schedule colonoscopy Address: [address] [place], [place], [place] [address] Phone: [phone]  Please stop by lab before you go If you have mychart- we will send your results within 3 business days of us  receiving them.  If you do not have mychart- we will call you about results within 5 business days of us  receiving them.  *please also note that you will see labs on mychart as soon as they post. I will later go in and write notes on them- will say notes from Dr. Arlene Ben   Recommended follow up: Return in about 6 months (around 12/22/2023) for followup or sooner if needed.Schedule b4 you leave.

## 2023-06-22 NOTE — Progress Notes (Addendum)
 Phone: [phone]   Subjective:  Patient presents today for their annual physical. Chief complaint-noted.   See problem oriented charting- ROS- full  review of systems was completed and negative  Per full ROS sheet completed by patient except for topics noted under acute/chronic concerns  The following were reviewed and entered/updated in epic: Past Medical History:  Diagnosis Date   Allergy    seasonal   Anxiety    Bleeding hemorrhoid 05/24/2010   Depression 08/11/2006   Anxiety as well around down market- tough time with job loss in [employer] [place]     Hyperlipidemia LDL goal < 130 05/24/2010   Irritable bowel syndrome 08/29/2007   Improved with diet change     Parkinson disease (HCC)    since 2016   Patient Active Problem List   Diagnosis Date Noted   Cancer of prostate with intermediate recurrence risk, stage T2B-C or Gleason 7 or prostate-specific antigen (PSA) 10-20 (HCC) 06/01/2022    Priority: High   Hypotestosteronemia 11/05/2015    Priority: High   Parkinson's disease (HCC) 10/30/2014    Priority: High   Low serum vitamin D  04/16/2015    Priority: Medium    Hyperlipidemia 05/24/2010    Priority: Medium    Anxiety state 08/11/2006    Priority: Medium    History of colon polyps 01/08/2018    Priority: Low   Bleeding hemorrhoid 05/24/2010    Priority: Low   Irritable bowel syndrome 08/29/2007    Priority: Low   Past Surgical History:  Procedure Laterality Date   APPENDECTOMY     COLONOSCOPY  05-01-00   HERNIA REPAIR  2014   Bil inguinal hernia repair   ROOT CANAL     TONSILLECTOMY  1958    Family History  Problem Relation Age of Onset   Hypertension Mother    Breast cancer Mother    Ovarian cancer Mother        in her 39s   Prostate cancer Father        complications from radioactive seeds, colostomy for last 10 years    Medications- reviewed and updated Current Outpatient Medications  Medication Sig Dispense Refill   carbidopa -levodopa   (SINEMET  IR) 25-100 MG tablet Take 2 pills at 6am/8:30/10:30/1pm/4pm May take 1 PRN daily if needed 915 tablet 0   clonazePAM (KLONOPIN) 0.5 MG tablet Take 0.5 mg by mouth at bedtime as needed for anxiety (per [facility] neurology).     RYTARY  48.75-195 MG CPCR Take 2 tablets by mouth. Six times per day     No current facility-administered medications for this visit.    Allergies-reviewed and updated Allergies  Allergen Reactions   Amoxicillin Other (See Comments)    Severe fatigue    Social History   Social History Narrative   Lives alone. Long term partner - does not live with him. Home is two levels      Retired  September 2023 from [employer].    Formerly Development worker, community.    Goes to [employer]      Hobbies: Exercises regularly, relax with friends   Right handed   Caffeine 1 cup daily   Objective  Objective:  BP 110/72   Pulse 81   Temp 98.4 F (36.9 C)   Ht 6' 3 (1.905 m)   Wt 207 lb 3.2 oz (94 kg)   SpO2 94%   BMI 25.90 kg/m  Gen: NAD, resting comfortably HEENT: Mucous membranes are moist. Oropharynx normal  Neck: no thyromegaly CV: RRR no murmurs rubs or gallops Lungs: CTAB no crackles, wheeze, rhonchi Abdomen: soft/nontender/nondistended/normal bowel sounds. No rebound or guarding.  Ext: no edema Skin: warm, dry Neuro: resting tremor noted, PERRLA   Assessment and Plan  68 y.o. male presenting for annual physical.  Health Maintenance counseling: 1. Anticipatory guidance: Patient counseled regarding regular dental exams -q6 months, eye exams -yearly,  avoiding smoking and second hand smoke , limiting alcohol to 2 beverages per day - not drinking at all now, no illicit drugs .   2. Risk factor reduction:  Advised patient of need for regular exercise and diet rich and fruits and vegetables to reduce risk of heart attack and stroke.  Exercise- [facility] small private gym for one on one- previously [facility].   Diet/weight management-down 6 lbs from last visit- had been 215 at [facility] and reports intentionally worked on this- his goal is 199.  Wt Readings from Last 3 Encounters:  06/22/23 207 lb 3.2 oz (94 kg)  12/15/22 213 lb (96.6 kg)  09/19/22 201 lb 9.6 oz (91.4 kg)  3. Immunizations/screenings/ancillary studies- up to date on vaccines  Immunization History  Administered Date(s) Administered   Fluad Quad(high Dose 65+) 08/03/2021   Fluad Trivalent(High Dose 65+) 09/19/2022   Influenza Whole 01/03/2002   Influenza, High Dose Seasonal PF 10/24/2020   Influenza,inj,Quad PF,6+ Mos 08/19/2017, 09/04/2018   Influenza-Unspecified 09/21/2014, 10/08/2015, 08/31/2016, 10/04/2019   PFIZER(Purple Top)SARS-COV-2 Vaccination 03/19/2019, 04/15/2019, 10/15/2019   PNEUMOCOCCAL CONJUGATE-20 06/03/2021   Pfizer Covid-19 Vaccine Bivalent Booster 53yrs & up 10/24/2020   Td 10/03/1996, 11/05/2007   Tdap 11/10/2017   Zoster Recombinant(Shingrix) 05/25/2016, 10/20/2016   Zoster, Live 11/18/2015  4. Prostate cancer follow-up- last seen by urology on 03/30/2023 with good report-they are checking his PSAs after prior SBRT with [name] for prostate cancer- reports #s dropped dramatically and has follow up with them in next few months 5. Colon cancer screening - he is due for this- last 2019 with 5 year follow up- wants to reassess things with [facility] first and then he will call to schedule 6. Skin cancer screening- Dr.an jones at least yearly. advised regular sunscreen use. Denies worrisome, changing, or new skin lesions.  7. Smoking associated screening (lung cancer screening, AAA screen 65-75, UA)- never smoker 8. STD screening - only active with long term partner prior to radiation- now this is not a part of relatoinship  Status of chronic or acute concerns   # Social update-Mom at [facility] [place] 54 in 2025 with memory loss- visits MWF, primary caregiver -he has placed a deposit there  -he is driving -great long  term friend with liver cancer  # Parkinson's disease-follows with Dr. Winferd Hatter--? Dr. Donzetta Galli S: Medication: Sinemet  instant release 2 tablets 8x a day- nights are very challenging if he wakes up -caregiver Manny very helpful comes at 5 am to 9 am seven days a week as that is hardest time for him in the day -Also on Requip  in past- now on rytary  -Looking at possible infusion vyalev but very difficult to get approved -Uses a walker or cane- has cane with him  -Thankfully no falls still reported   A/P: parkinsons has been very challenging and needing more support. Placed deposit at [facility] [place] and that seems very appropriate    #hyperlipidemia S: Medication:none  -Elevated ASCVD risk over 10% but has declined CT calcium scoring or statin- The 10-year ASCVD risk score (Arnett DK, et al., 2019) is: 11.4%  Lab Results  Component Value Date   CHOL 177 06/09/2022   HDL 43.50 06/09/2022   LDLCALC 104 (H) 06/09/2022   TRIG 147.0 06/09/2022   CHOLHDL 4 06/09/2022   A/P: update lipids- still not super interesed in medicine or more workup  # Insomnia/anxiety S: Medication: Occasional trazodone  75 mg -had been sluggish on this in past - therapy- working with Boone Buzzard  A/P: better lately with binereal beats. And ibuprofen pm.    #Vitamin D  deficiency S: Medication: 1000 units daily in past  Last vitamin D  Lab Results  Component Value Date   VD25OH 42.95 06/09/2022  A/P: hopefully stable- update vitamin D  today. Continue current meds for now    Recommended follow up: Return in about 6 months (around 12/22/2023) for followup or sooner if needed.Schedule b4 you leave. Future Appointments  Date Time Provider Department Center  06/27/2023 10:00 AM [name] [facility] None  07/11/2023 10:00 AM [name], [facility] None  07/18/2023 10:00 AM [name], [facility] None  07/25/2023 10:00 AM [name], [facility] None  08/01/2023 10:00 AM [name],  [facility] None  08/10/2023 11:00 AM [name], [facility] None  09/25/2023 10:00 AM [facility] ANNUAL WELLNESS VISIT 1 [facility] PEC  11/02/2023 11:45 AM Kelsey Patricia, PT [facility]  11/02/2023 12:00 PM Nani Baba, OT [facility]  11/02/2023 12:15 PM Schinke, Alethea Andes, CCC-SLP [facility]    Lab/Order associations: NOT fasting   ICD-10-CM   1. Preventative health care  Z00.00     2. Hyperlipidemia, unspecified hyperlipidemia type  E78.5 Comprehensive metabolic panel with GFR    CBC with Differential/Platelet    Lipid panel    3. Low serum vitamin D   R79.89 VITAMIN D  25 Hydroxy (Vit-D Deficiency, Fractures)    4. Screening for diabetes mellitus  Z13.1 Hemoglobin A1c    5. Overweight  E66.3 Hemoglobin A1c    6. Parkinson's disease with dyskinesia and fluctuating manifestations (HCC) Chronic G20.B2       No orders of the defined types were placed in this encounter.   Return precautions advised.  Clarisa Crooked, MD

## 2023-06-27 ENCOUNTER — Ambulatory Visit: Admitting: Psychology

## 2023-06-27 DIAGNOSIS — F4321 Adjustment disorder with depressed mood: Secondary | ICD-10-CM

## 2023-06-27 NOTE — Progress Notes (Signed)
 [facility] Counselor/Therapist Progress Note  Patient ID: Dan [name], MRN: [id number],    Date: 06/27/2023  Time Spent:  56 mins    start time: 1004    end time: 1100  Treatment Type: Individual Therapy  Reported Symptoms: Pt presents in person in the office, granting consent for the session.  Mental Status Exam: Appearance:  Casual     Behavior: Appropriate  Motor: Normal  Speech/Language:  Clear and Coherent  Affect: Appropriate  Mood: normal  Thought process: normal  Thought content:   WNL  Sensory/Perceptual disturbances:   WNL  Orientation: oriented to person, place, and time/date  Attention: Good  Concentration: Good  Memory: WNL  Fund of knowledge:  Good  Insight:   Good  Judgment:  Good  Impulse Control: Good   Risk Assessment: Danger to Self:  No Self-injurious Behavior: No Danger to Others: No Duty to Warn:no Physical Aggression / Violence:No  Access to Firearms a concern: No  Gang Involvement:No   Subjective: Pt shares that I have continued to struggle with my symptoms.  My feet have been twitching a lot lately and that interferes with my sleep.  Dan [name] has continued to help me every morning and he is great; he is expensive but he is great.  Pt shares that he is still not approved for the medication pump but he has decided to buy it himself ($1995.00).  He still has an appt with Dr. Jama at [facility] on Thursday of this week.  Pt shares his medication usually gives him the ability to live his life from 830am to about 6pm.  Pt and Dan [name] went to see friends in [place] for a visit this past weekend; they enjoyed their time with friends.  Pt shares the story of his friend Dan [name] who is sick and is not doing well; I have talked with her recently and I know I will not see her again.  I know I am not doing great but I am hoping that this pump will make a big difference to me.  Pt shares that he is in the process of cleaning things out of his home a little  at a time.  He is being intentional about what he is doing with the process.  He also shares that his mom is doing very well at assisted living.  Dan [name] is coming down in Sept to see she wants anything out of pt's apt; he will also have his brothers come and look as well.  Dan [name], pt, and a couple of their neighbors are planning to go to [place] for vacation sometime this summer.  His follow up with the cancer doctor in July.  Encouraged pt to continue with his self care activities and we will meet next week for a follow up session.  Interventions: Cognitive Behavioral Therapy  Diagnosis:No diagnosis found.  Plan: Treatment Plan Strengths/Abilities:  Intelligent, Intuitive, Willing to participate in therapy Treatment Preferences:  Outpatient Individual Therapy Statement of Needs:  Patient is to use CBT, mindfulness and coping skills to help manage and/or decrease symptoms associated with their diagnosis. Symptoms:  Depressed/Irritable mood, worry, social withdrawal Problems Addressed:  Depressive thoughts, Sadness, Sleep issues, etc. Long Term Goals:  Pt to reduce overall level, frequency, and intensity of the feelings of depression as evidenced by decreased irritability, negative self talk, and helpless feelings from 6 to 7 days/week to 0 to 1 days/week, per client report, for at least 3 consecutive months.  Progress: 30% Short Term Goals:  Pt to verbally express understanding of the relationship between feelings of depression and their impact on thinking patterns and behaviors.  Pt to verbalize an understanding of the role that distorted thinking plays in creating fears, excessive worry, and ruminations.  Progress: 30% Target Date:  07/26/2023 Frequency:  Bi-weekly Modality:  Cognitive Behavioral Therapy Interventions by Therapist:  Therapist will use CBT, Mindfulness exercises, Coping skills and Referrals, as needed by client. Client has verbally approved this treatment plan.  Francis KATHEE Macintosh, [facility]

## 2023-06-29 DIAGNOSIS — G20B2 Parkinson's disease with dyskinesia, with fluctuations: Secondary | ICD-10-CM | POA: Diagnosis not present

## 2023-07-03 DIAGNOSIS — G20B2 Parkinson's disease with dyskinesia, with fluctuations: Secondary | ICD-10-CM | POA: Diagnosis not present

## 2023-07-11 ENCOUNTER — Ambulatory Visit: Admitting: Psychology

## 2023-07-11 DIAGNOSIS — F4321 Adjustment disorder with depressed mood: Secondary | ICD-10-CM

## 2023-07-11 NOTE — Progress Notes (Signed)
 Dan [name]  Patient ID: Dan [name], MRN: [id number],    Date: 06/27/2023  Time Spent:  59 mins    start time: 1001    end time: 1100  Treatment Type: Individual Therapy  Reported Symptoms: Pt presents in person in the office, granting consent for the session.  Mental Status Exam: Appearance:  Casual     Behavior: Appropriate  Motor: Normal  Speech/Language:  Clear and Coherent  Affect: Appropriate  Mood: normal  Thought process: normal  Thought content:   WNL  Sensory/Perceptual disturbances:   WNL  Orientation: oriented to person, place, and time/date  Attention: Good  Concentration: Good  Memory: WNL  Fund of knowledge:  Good  Insight:   Good  Judgment:  Good  Impulse Control: Good   Risk Assessment: Danger to Self:  No Self-injurious Behavior: No Danger to Others: No Duty to Warn:no Physical Aggression / Violence:No  Access to Firearms a concern: No  Gang Involvement:No   Subjective: Pt shares that I finally have my pump.  The nurse is coming to my home this afternoon to show me all of the stuff and then I go to see Dr. Jama on Thursday to have him set everything up and to decide how much medication I need.  Dan [name] also wants me to be in a deep brain stimulation study and I have appts with a neurosurgeon, neuropsychologist, and someone else.  Dan [name] and Dan [name] are going with me to my appt on Thursday and Dan [name] is driving us .  Pt shares that she has gotten an appt with a provider at [name] coming up as well in July.  His appts with the deep brain stimulation people is on 7/31 and he also has a six month follow up with his cancer doctor on that same date.  Pt has lots of hope in this pump and how it may benefit him.  Pt shares that he has applied for a studio apt at [name] and he feels good about that.  Pt shares his neighbor, Dan [name], has been very helpful to him through this process.  Pt shares his mom is doing great  at assisted living at [facility]; she has a friend there and that is good.  Pt is in the process of cleaning out personal items from his home since he knows he will have to sell his home eventually, before moving to [name].  He is having Dan [name] help him with it and pt is using his contacts at the [employer] to help get rid of things as well.  Pt and Dan [name] went to pt's friend's home for lunch on this past Saturday and they both had a great time.  Pt is able to appreciate lots of really interesting people he has met throughout his life.  Dan [name] is coming down in Sept to see she wants anything out of pt's apt; he will also have his brothers come and look as well.  Dan [name], pt, and a couple of their neighbors are planning to go to [name] for vacation sometime this summer.  Pt had his annual physical with Dan [name] last week and that went well.  Encouraged pt to continue with his self care activities and we will meet next week for a follow up session.  Interventions: Cognitive Behavioral Therapy  Diagnosis:Adjustment disorder with depressed mood  Plan: Treatment Plan Strengths/Abilities:  Intelligent, Intuitive, Willing to participate in therapy Treatment Preferences:  Outpatient Individual Therapy Statement of Needs:  Patient is to use CBT, mindfulness and coping skills to help manage and/or decrease symptoms associated with their diagnosis. Symptoms:  Depressed/Irritable mood, worry, social withdrawal Problems Addressed:  Depressive thoughts, Sadness, Sleep issues, etc. Long Term Goals:  Pt to reduce overall level, frequency, and intensity of the feelings of depression as evidenced by decreased irritability, negative self talk, and helpless feelings from 6 to 7 days/week to 0 to 1 days/week, per client report, for at least 3 consecutive months.  Progress: 30% Short Term Goals:  Pt to verbally express understanding of the relationship between feelings of depression and their impact on thinking  patterns and behaviors.  Pt to verbalize an understanding of the role that distorted thinking plays in creating fears, excessive worry, and ruminations.  Progress: 30% Target Date:  07/25/2024 Frequency:  Bi-weekly Modality:  Cognitive Behavioral Therapy Interventions by Therapist:  Therapist will use CBT, Mindfulness exercises, Coping skills and Referrals, as needed by client. Client has verbally approved this treatment plan.  Francis Dan [name], [facility]

## 2023-07-13 DIAGNOSIS — G20B2 Parkinson's disease with dyskinesia, with fluctuations: Secondary | ICD-10-CM | POA: Diagnosis not present

## 2023-07-17 DIAGNOSIS — G20B2 Parkinson's disease with dyskinesia, with fluctuations: Secondary | ICD-10-CM | POA: Diagnosis not present

## 2023-07-18 ENCOUNTER — Ambulatory Visit: Admitting: Psychology

## 2023-07-25 ENCOUNTER — Ambulatory Visit: Admitting: Psychology

## 2023-07-25 DIAGNOSIS — F4321 Adjustment disorder with depressed mood: Secondary | ICD-10-CM

## 2023-07-25 NOTE — Progress Notes (Addendum)
 [name] Progress Note  Patient ID: Dan [name], MRN: [id number],    Date: 07/25/2023  Time Spent:  60 mins    start time: 1000    end time: 1100  Treatment Type: Individual Therapy  Reported Symptoms: Pt presents in person in Dan office, granting consent for Dan session.  Mental Status Exam: Appearance:  Casual     Behavior: Appropriate  Motor: Normal  Speech/Language:  Clear and Coherent  Affect: Appropriate  Mood: normal  Thought process: normal  Thought content:   WNL  Sensory/Perceptual disturbances:   WNL  Orientation: oriented to person, place, and time/date  Attention: Good  Concentration: Good  Memory: WNL  Fund of knowledge:  Good  Insight:   Good  Judgment:  Good  Impulse Control: Good   Risk Assessment: Danger to Self:  No Self-injurious Behavior: No Danger to Others: No Duty to Warn:no Physical Aggression / Violence:No  Access to Firearms a concern: No  Gang Involvement:No   Subjective: Pt shares that I lost my friend, Dan [name], who had liver cancer.  I was sad to lose her.  I had to cancel my dentist appt last week because I was shaking so badly.  Pt shares that Dan pump is not what it is cracked up to be.  Dan medication does not work as well as it is advertised.  Also, Dr. Jama has been passive aggressive about Dan pump.  He does not like it and had only two pts in his practice on Dan pump and he ended up taking both of them off of it.  Pt shares that Dan pump is bulky and awkward; pt shares that he is totally disappointed by Dan process to get Dan pump and Dan process of having it.  Pt has an appt tomorrow (second appt of three) at [name] to be evaluated for a deep brain stimulator program.  He also has an appt at [name] for anxiety medication evaluation on Friday.  Pt is still using Dan [name] to help him and it is good for pt and it is also expensive for him.  His name is on Dan list for [facility] [name] but he worries that  he cannot go to independent living there now, because of his current condition.  Pt shares he continues to work to clean out his [name] and is giving things to Dan [name] and others because he knows he needs to clean out his [name] in preparation for selling his [name] (is thinking about listing it in 2026).  Pt is concerned about Dan loss of independence that he is coming up to.  Pt has a follow up with his cancer doctor at [name] on 7/31.  Pt still has lots of Dan who are continuing to engage with him; he had a friend who wanted pt to go with her to [name] and paid for Dan [name] to go with them.  He has Dan who are still taking him out to lunch and who are inviting him and Dan [name] to dinner.  He appreciates these meals and Dan socialization that occurs during these events.  Pt shares that Dan [name] is planning to come around his birthday; his friend Dan [name] is planning to come for his birthday as well; he is looking forward to those visits. Encouraged pt to continue with his self care activities and we will meet next week for a follow up session.  Interventions: Cognitive Behavioral Therapy  Diagnosis:Adjustment disorder with depressed mood  Plan: Treatment Plan Strengths/Abilities:  Intelligent,  Intuitive, Willing to participate in therapy Treatment Preferences:  Outpatient Individual Therapy Statement of Needs:  Patient is to use CBT, mindfulness and coping skills to help manage and/or decrease symptoms associated with their diagnosis. Symptoms:  Depressed/Irritable mood, worry, social withdrawal Problems Addressed:  Depressive thoughts, Sadness, Sleep issues, etc. Long Term Goals:  Pt to reduce overall level, frequency, and intensity of Dan feelings of depression as evidenced by decreased irritability, negative self talk, and helpless feelings from 6 to 7 days/week to 0 to 1 days/week, per client report, for at least 3 consecutive months.  Progress: 30% Short Term Goals:  Pt to verbally express  understanding of Dan relationship between feelings of depression and their impact on thinking patterns and behaviors.  Pt to verbalize an understanding of Dan role that distorted thinking plays in creating fears, excessive worry, and ruminations.  Progress: 30% Target Date:  07/25/2024 Frequency:  Bi-weekly Modality:  Cognitive Behavioral Therapy Interventions by Therapist:  Therapist will use CBT, Mindfulness exercises, Coping skills and Referrals, as needed by client. Client has verbally approved this treatment plan.  Francis KATHEE Macintosh, [facility]               Francis KATHEE Macintosh, [name]

## 2023-07-26 DIAGNOSIS — G20B2 Parkinson's disease with dyskinesia, with fluctuations: Secondary | ICD-10-CM | POA: Diagnosis not present

## 2023-07-28 ENCOUNTER — Encounter: Payer: Self-pay | Admitting: Behavioral Health

## 2023-07-28 ENCOUNTER — Ambulatory Visit: Admitting: Behavioral Health

## 2023-07-28 VITALS — BP 164/98 | HR 120 | Ht 75.0 in | Wt 197.0 lb

## 2023-07-28 DIAGNOSIS — F4321 Adjustment disorder with depressed mood: Secondary | ICD-10-CM

## 2023-07-28 DIAGNOSIS — F410 Panic disorder [episodic paroxysmal anxiety] without agoraphobia: Secondary | ICD-10-CM

## 2023-07-28 DIAGNOSIS — F411 Generalized anxiety disorder: Secondary | ICD-10-CM

## 2023-07-28 MED ORDER — LORAZEPAM 1 MG PO TABS: 1.0000 mg | ORAL_TABLET | Freq: Two times a day (BID) | ORAL | 1 refills | Status: DC

## 2023-07-28 MED ORDER — SERTRALINE HCL 50 MG PO TABS: 50.0000 mg | ORAL_TABLET | Freq: Every day | ORAL | 1 refills | Status: DC

## 2023-07-28 NOTE — Progress Notes (Signed)
 [facility] MD/PA/NP Initial Note  07/28/2023 2:53 PM Dan [name]  MRN:  [id number]  Chief Complaint:  Chief Complaint   Anxiety; Depression; Follow-up; Patient Education; Panic Attack     HPI:   Dan [name], 68 year old male presents to this office for initial visit and to establish care.  He is accompanied by longtime friend [name] with his verbal consent.  Collateral information should be considered reliable.  Patient was diagnosed with Parkinson's disease approximately 10 years ago and is ambulating with assistance of single-point cane.  Says that he has been under the care of [name] for treatment of Parkinson's and will be receiving deep brain stimulation in the near future.  Due to the nature of his disease and the added stressors, he has been experiencing more frequent panic attacks and anxiety.  Says panic attacks last approximately 15 to 20 minutes and reports losing control of breathing, heaviness on chest.  Those the symptoms eventually subside.  Says that the anxiety increases the intensity of his Parkinson's.  He had 1 previous trial of sertraline  but only took the medication for a very short period without allowing adequate amount of time for the medication to work.  Patient was also treated for prostate cancer approximately 1 year ago.  He is here today to discuss possible medications to assist with ongoing anxiety and panic.  He is currently prescribed Klonopin but has not been utilizing frequently.  He is open to new medications that may assist.  His PHQ 2 was negative his MDQ was grossly negative.  He numerically rates depression at 1/10, and anxiety at 5/10.  Says that he sleeps 7 to 8 hours daily.  He denies any history of mania, no psychosis, no auditory or visual hallucinations or delirium.  Acknowledges strong family and friend support group.  Patient is currently retired.  No problems with attention and focus.  Appetite is good.  Prior psychiatric medication  trials: Sertraline -only took medication for very short period, not sure of the dose     Visit Diagnosis:    ICD-10-CM   1. Adjustment disorder with depressed mood  F43.21 sertraline  (ZOLOFT ) 50 MG tablet    2. Generalized anxiety disorder  F41.1 sertraline  (ZOLOFT ) 50 MG tablet    LORazepam  (ATIVAN ) 1 MG tablet    3. Panic attack  F41.0 sertraline  (ZOLOFT ) 50 MG tablet    LORazepam  (ATIVAN ) 1 MG tablet      Past Psychiatric History: Anxiety, Panic  Past Medical History:  Past Medical History:  Diagnosis Date   Allergy    seasonal   Anxiety    Bleeding hemorrhoid 05/24/2010   Depression 08/11/2006   Anxiety as well around down market- tough time with job loss in [place]     Hyperlipidemia LDL goal < 130 05/24/2010   Irritable bowel syndrome 08/29/2007   Improved with diet change     Parkinson disease (HCC)    since 2016   Seizures (HCC)     Past Surgical History:  Procedure Laterality Date   APPENDECTOMY     COLONOSCOPY  05-01-00   HERNIA REPAIR  2014   Bil inguinal hernia repair   ROOT CANAL     TONSILLECTOMY  1958    Family Psychiatric History: see chart  Family History:  Family History  Problem Relation Age of Onset   Depression Mother    Hypertension Mother    Breast cancer Mother    Ovarian cancer Mother        in her  71s   Prostate cancer Father        complications from radioactive seeds, colostomy for last 10 years   Bipolar disorder Brother    ADD / ADHD Brother    OCD Brother     Social History:  Social History   Socioeconomic History   Marital status: Single    Spouse name: Not on file   Number of children: 0   Years of education: Not on file   Highest education level: Bachelor's degree (e.g., BA, AB, BS)  Occupational History   Occupation: Publishing copy    Comment: works at [employer]   Occupation: Retired    Comment: Armed forces operational officer  Tobacco Use   Smoking status: Never   Smokeless tobacco: Never  Vaping Use    Vaping status: Never Used  Substance and Sexual Activity   Alcohol use: Not Currently    Comment: social   Drug use: No   Sexual activity: Not Currently  Other Topics Concern   Not on file  Social History Narrative   Lives alone. Long term partner - does not live with him. Home is two levels      Retired  September 2023 from [employer].    Formerly Development worker, community.    Goes to [employer]      Hobbies: Exercises regularly, relax with friends   Right handed   Caffeine 1 cup daily   Social Drivers of Health   Financial Resource Strain: Low Risk  (07/26/2023)   Received from [facility]   Overall Financial Resource Strain (CARDIA)    Difficulty of Paying Living Expenses: Not hard at all  Food Insecurity: Low Risk  (03/30/2023)   Received from [facility]   Hunger Vital Sign    Within the past 12 months, you worried that your food would run out before you got money to buy more: Never true    Within the past 12 months, the food you bought just didn't last and you didn't have money to get more. : Never true  Transportation Needs: No Transportation Needs (03/30/2023)   Received from [facility]    In the past 12 months, has lack of reliable transportation kept you from medical appointments, meetings, work or from getting things needed for daily living? : No  Physical Activity: Sufficiently Active (09/19/2022)   Exercise Vital Sign    Days of Exercise per Week: 3 days    Minutes of Exercise per Session: 50 min  Stress: No Stress Concern Present (09/19/2022)   [employer] of Occupational Health - Occupational Stress Questionnaire    Feeling of Stress : Not at all  Social Connections: Moderately Isolated (09/19/2022)   Social Connection and Isolation Panel    Frequency of Communication with Friends and Family: More than three times a week    Frequency of Social Gatherings with Friends  and Family: More than three times a week    Attends Religious Services: More than 4 times per year    Active Member of [employer] or Organizations: No    Attends Banker Meetings: Never    Marital Status: Never married    Allergies:  Allergies  Allergen Reactions   Amoxicillin Other (See Comments)    Severe fatigue    Metabolic Disorder Labs: Lab Results  Component Value Date   HGBA1C 6.2 06/22/2023   No results found for: PROLACTIN Lab Results  Component Value Date  CHOL 164 06/22/2023   TRIG 137.0 06/22/2023   HDL 35.30 (L) 06/22/2023   CHOLHDL 5 06/22/2023   VLDL 27.4 06/22/2023   LDLCALC 101 (H) 06/22/2023   LDLCALC 104 (H) 06/09/2022   Lab Results  Component Value Date   TSH 0.99 05/09/2017   TSH 1.16 10/29/2015    Therapeutic Level Labs: No results found for: LITHIUM No results found for: VALPROATE No results found for: CBMZ  Current Medications: Current Outpatient Medications  Medication Sig Dispense Refill   LORazepam  (ATIVAN ) 1 MG tablet Take 1 tablet (1 mg total) by mouth 2 (two) times daily. 60 tablet 1   sertraline  (ZOLOFT ) 50 MG tablet Take 1 tablet (50 mg total) by mouth daily. 30 tablet 1   carbidopa -levodopa  (SINEMET  IR) 25-100 MG tablet Take 2 pills at 6am/8:30/10:30/1pm/4pm May take 1 PRN daily if needed 915 tablet 0   RYTARY  48.75-195 MG CPCR Take 2 tablets by mouth. Six times per day     No current facility-administered medications for this visit.    Medication Side Effects: none  Orders placed this visit:  No orders of the defined types were placed in this encounter.   Psychiatric Specialty Exam:  Review of Systems  Blood pressure (!) 164/98, pulse (!) 120, height 6' 3 (1.905 m), weight 197 lb (89.4 kg).Body mass index is 24.62 kg/m.  General Appearance: Casual, Neat, and Well Groomed  Eye Contact:  Good  Speech:  Clear and Coherent  Volume:  Normal  Mood:  NA  Affect:  Appropriate  Thought Process:  Coherent   Orientation:  Full (Time, Place, and Person)  Thought Content: Logical   Suicidal Thoughts:  No  Homicidal Thoughts:  No  Memory:  WNL  Judgement:  Good  Insight:  Good  Psychomotor Activity:  Normal  Concentration:  Concentration: Good  Recall:  Good  Fund of Knowledge: Good  Language: Good  Assets:  Desire for Improvement  ADL's:  Intact  Cognition: WNL  Prognosis:  Good   Screenings:  GAD-7    Flowsheet Row Office Visit from 12/15/2022 in [facility] from 06/16/2022 in [facility] from 05/09/2017 in [facility]  Total GAD-7 Score 1 1 16    PHQ2-9    Flowsheet Row Office Visit from 12/15/2022 in [facility] Clinical Support from 09/19/2022 in [facility] from 06/16/2022 in [facility] from 09[facility]17/2021 in [facility] from 06/03/2021 in [facility] [place]  PHQ-2 Total Score 0 0 0 0 0  PHQ-9 Total Score 3 0 2 -- 4    Receiving Psychotherapy: No   Treatment Plan/Recommendations:  Greater than 50% of 60 min face to face time with patient was spent on counseling and coordination of care. We discussed his problems with increased anxiety and panic secondary to Parkinson's disease.  Discussed his recent panic attacks that also exacerbate parkinsonian symptoms.  We discussed previous medication trial along with new medication options that are shown to be safe and effective with Parkinson's diagnosis.  We discussed his ongoing treatment and pending deep brain stimulation with [name].  We agreed today to:  Will start sertraline  25 mg for 7 days then 50 mg daily after  breakfast.  Research is shown that sertraline  may be one of the  safer SSRIs to treat depression and anxiety when accompanied with Parkinson's disease. Will switch from Klonopin to Ativan  1 mg twice daily as needed for severe anxiety Will follow-up in 4 weeks to reassess Provided emergency contact information To report worsening symptoms or side effects promptly Discussed potential benefits, risk, and side effects of benzodiazepines to include potential risk of tolerance and dependence, as well as possible drowsiness.  Advised patient not to drive if experiencing drowsiness and to take lowest possible effective dose to minimize risk of dependence and tolerance.  Reviewed PDMP     [name], NP

## 2023-08-01 ENCOUNTER — Ambulatory Visit: Admitting: Psychology

## 2023-08-01 DIAGNOSIS — F4321 Adjustment disorder with depressed mood: Secondary | ICD-10-CM

## 2023-08-01 NOTE — Progress Notes (Addendum)
 [facility] Counselor/Therapist Progress Note  Patient ID: Dan [name], MRN: [id number],    Date: 08/01/2023  Time Spent:  45 mins    start time: 1015    end time: 1100  Treatment Type: Individual Therapy  Reported Symptoms: Pt presents in person in the office, granting consent for the session.  Mental Status Exam: Appearance:  Casual     Behavior: Appropriate  Motor: Normal  Speech/Language:  Clear and Coherent  Affect: Appropriate  Mood: normal  Thought process: normal  Thought content:   WNL  Sensory/Perceptual disturbances:   WNL  Orientation: oriented to person, place, and time/date  Attention: Good  Concentration: Good  Memory: WNL  Fund of knowledge:  Good  Insight:   Good  Judgment:  Good  Impulse Control: Good   Risk Assessment: Danger to Self:  No Self-injurious Behavior: No Danger to Others: No Duty to Warn:no Physical Aggression / Violence:No  Access to Firearms a concern: No  Gang Involvement:No   Subjective: Pt shares that I saw Redell Pizza, NP at your brother's practice last Friday.  He was very nice and very thorough and I really liked him.  He did a great job interviewing me.  He gave me Lorazepam  (Ativan ) and Zoloft .  I also saw the Neurosurgeon at [facility] as part two of the three part evaluation for the deep brain stimulation treatment.  The last session of that evaluation will be in Sept; they wanted it to be 7/31 but that is the day of my cancer check up and I did not want to miss that one.  The last evaluation with the deep brain stimulator has to be me without any of my Parkinson's medications.  Pt shares that he is still trying to use the pump and the pump is not being helpful so far.  He has an appt with Dr. Jama on 8/6 to see if it can be calibrated to help him.  Pt is looking forward to the final appt for the deep brain stimulation; he has been researching the procedure and is hopeful that he can be part of that procedure.  Pt shares  that [name] helped him pay his bills because pt was not able to write them because he was shaking so much.  Pt is still concerned about the expense of Manny but he appreciates the help he provides.  Pt shares that [name] is planning to come around his birthday; his friend [name] is planning to come for his birthday as well; he is looking forward to those visits. Encouraged pt to continue with his self care activities and we will meet next week for a follow up session.  Interventions: Cognitive Behavioral Therapy  Diagnosis:Adjustment disorder with depressed mood  Plan: Treatment Plan Strengths/Abilities:  Intelligent, Intuitive, Willing to participate in therapy Treatment Preferences:  Outpatient Individual Therapy Statement of Needs:  Patient is to use CBT, mindfulness and coping skills to help manage and/or decrease symptoms associated with their diagnosis. Symptoms:  Depressed/Irritable mood, worry, social withdrawal Problems Addressed:  Depressive thoughts, Sadness, Sleep issues, etc. Long Term Goals:  Pt to reduce overall level, frequency, and intensity of the feelings of depression as evidenced by decreased irritability, negative self talk, and helpless feelings from 6 to 7 days/week to 0 to 1 days/week, per client report, for at least 3 consecutive months.  Progress: 30% Short Term Goals:  Pt to verbally express understanding of the relationship between feelings of depression and their impact on thinking patterns and  behaviors.  Pt to verbalize an understanding of the role that distorted thinking plays in creating fears, excessive worry, and ruminations.  Progress: 30% Target Date:  07/25/2024 Frequency:  Bi-weekly Modality:  Cognitive Behavioral Therapy Interventions by Therapist:  Therapist will use CBT, Mindfulness exercises, Coping skills and Referrals, as needed by client. Client has verbally approved this treatment plan.  Dan [name], [facility]

## 2023-08-02 DIAGNOSIS — K080 Exfoliation of teeth due to systemic causes: Secondary | ICD-10-CM | POA: Diagnosis not present

## 2023-08-03 DIAGNOSIS — C61 Malignant neoplasm of prostate: Secondary | ICD-10-CM | POA: Diagnosis not present

## 2023-08-04 ENCOUNTER — Telehealth: Payer: Self-pay | Admitting: Family Medicine

## 2023-08-04 NOTE — Telephone Encounter (Signed)
 Called patient and reviewed last labs and providers remarks and recommendations on the lab notes. Pt thanked me for the call and verbalized provider remarks and recommendations.    Copied from CRM [id number]. Topic: Clinical - Lab/Test Results >> Aug 03, 2023  4:00 PM Aisha D wrote: Reason for CRM: Pt is requesting to speak with Dr.Hunter or his nurse in regards to his recent lab results and the abnormal results he saw. Pt also would like for his labs to be redone because he didn't fast for this labs and stated that he usually has to fast for physical labs.

## 2023-08-05 ENCOUNTER — Encounter: Payer: Self-pay | Admitting: Psychiatry

## 2023-08-05 NOTE — Progress Notes (Signed)
 Spoke with the patient on the phone. He reports that after going up on Zoloft  to 50mg  he developed intense nausea and vomiting - he is unable to keep food down. He also feels this worsened his dyskinesia related to his Parkinson's. I recommended he go back down to zoloft  25mg  daily for now. We can consider switch agents or using a liquid formulation for a more gradual titration

## 2023-08-08 ENCOUNTER — Telehealth: Payer: Self-pay | Admitting: Behavioral Health

## 2023-08-08 ENCOUNTER — Other Ambulatory Visit: Payer: Self-pay

## 2023-08-08 MED ORDER — CLONAZEPAM 0.5 MG PO TABS: 0.5000 mg | ORAL_TABLET | Freq: Two times a day (BID) | ORAL | 0 refills | Status: DC | PRN

## 2023-08-08 NOTE — Telephone Encounter (Signed)
 Pt LVM @ 4:05p stating he was returning Bridget's call.  Next appt 8/13

## 2023-08-08 NOTE — Telephone Encounter (Signed)
 Reviewed recommendations with patient. He does not want to take a medication every day. He reports he was previously on clonazepam  and that worked well. He c/o of nausea, loss of appetite, and that sx are not tolerable. Encouraged him to force fluids. He reports having a PA, recommended he take Ativan . He asked to talk to you and I explained my role and what the process was and he said he didn't have a problem talking to me. I asked him if he wanted to try to get an earlier appt and he did not.

## 2023-08-08 NOTE — Telephone Encounter (Signed)
 Have him just stop the medication, ask him if he would like to try a different  SSRI. If he is ok with it, pend Lexapro 5 mg and we can see if he tolerates that. It will take a few days to wash the Zoloft .

## 2023-08-08 NOTE — Telephone Encounter (Signed)
 Have tried multiple times to reach pt at the 2 numbers we have. LVM per DPR that Rx for clonazepam  0.5 BID was sent in. He can't take it and lorazepam . Previous scripts for provider in [place] were canceled. He was only prescribed 0.5, 1 every day.

## 2023-08-08 NOTE — Telephone Encounter (Signed)
 Pt lvm 4:53 pm. Having reaction to Zoloft . [phone] RTC

## 2023-08-08 NOTE — Telephone Encounter (Signed)
 Apt 8/13 and on canc list

## 2023-08-08 NOTE — Telephone Encounter (Signed)
 Pt seen for initial visit 7/25 and prescribed Zoloft  50 mg (1/2 x 7 days). He called on-call provider 8/2. See note below.   Spoke with the patient on the phone. He reports that after going up on Zoloft  to 50mg  he developed intense nausea and vomiting - he is unable to keep food down. He also feels this worsened his dyskinesia related to his Parkinson's. I recommended he go back down to zoloft  25mg  daily for now. We can consider switch agents or using a liquid formulation for a more gradual titration.   He called twice this morning to report he decreased dose back to 25 mg on Sunday and is no better. Please advise.

## 2023-08-09 DIAGNOSIS — G20B2 Parkinson's disease with dyskinesia, with fluctuations: Secondary | ICD-10-CM | POA: Diagnosis not present

## 2023-08-10 ENCOUNTER — Ambulatory Visit: Admitting: Psychology

## 2023-08-10 DIAGNOSIS — F4321 Adjustment disorder with depressed mood: Secondary | ICD-10-CM

## 2023-08-10 NOTE — Progress Notes (Addendum)
 [facility] Counselor/Therapist Progress Note  Patient ID: ANTHONI GEERTS, MRN: [id number],    Date: 08/10/2023  Time Spent:  57 mins    start time: 1103    end time: 1200  Treatment Type: Individual Therapy  Reported Symptoms: Pt presents in person in the office, granting consent for the session.  Mental Status Exam: Appearance:  Casual     Behavior: Appropriate  Motor: Normal  Speech/Language:  Clear and Coherent  Affect: Appropriate  Mood: normal  Thought process: normal  Thought content:   WNL  Sensory/Perceptual disturbances:   WNL  Orientation: oriented to person, place, and time/date  Attention: Good  Concentration: Good  Memory: WNL  Fund of knowledge:  Good  Insight:   Good  Judgment:  Good  Impulse Control: Good   Risk Assessment: Danger to Self:  No Self-injurious Behavior: No Danger to Others: No Duty to Warn:no Physical Aggression / Violence:No  Access to Firearms a concern: No  Gang Involvement:No   Subjective: Pt shares that I took the Zoloft  and it was terrible; I had dry heaves, and was really restless.  I went to [name] yesterday to see Dr. Jama to get him to adjust the pump; he seemed exasperated but he did do it.  I am tired of being on all of these medications.  It seems like my life is about medications now.  Pt shares that [name] is doing well; his parents (father is 10 yo and mom is in her late 58s) were at his home painting the trim around his front door and his mom was trimming his bushes and working in his yard.  Pt shares that he took his mom out to [facility] for an ice cream cone last week.  Pt shares that part of him is frustrated, part of him is unhappy.  Pt is going back to [name] on 9/4 for the third installment in the evaluation for their deep brain stimulation.  He did get his lab results from his radiation follow up and the results were good.  Pt shares that he is concerned about how much money he is spending on [place]; he  appreciates what [name] does for him but he is still spending a significant amount of money.  Pt is planning to talk with [facility] about this issue and will try to create a new arrangement for them.  Pt is reading a book on birth order so he can understand [name] more (only child).  Pt continues to drive when he can with his symptoms; he likes to be able to go when he can.  Pt also mentions that his brother [name] is coming back for a visit and so is [name].  Pt wants to go back to see Redell Pizza and get a different antidepressant; he is going back to see him on Wednesday.       saw Redell Pizza, NP at your brother's practice last Friday.  He was very nice and very thorough and I really liked him.  He did a great job interviewing me.  He gave me Lorazepam  (Ativan ) and Zoloft .  I also saw the Neurosurgeon at [facility] as part two of the three part evaluation for the deep brain stimulation treatment.  The last session of that evaluation will be in Sept; they wanted it to be 7/31 but that is the day of my cancer check up and I did not want to miss that one.  The last evaluation with the deep brain stimulator has  to be me without any of my Parkinson's medications.  Pt shares that he is still trying to use the pump and the pump is not being helpful so far.  He has an appt with Dr. Jama on 8/6 to see if it can be calibrated to help him.  Pt is looking forward to the final appt for the deep brain stimulation; he has been researching the procedure and is hopeful that he can be part of that procedure.  Pt shares that [name] helped him pay his bills because pt was not able to write them because he was shaking so much.  Pt is still concerned about the expense of Manny but he appreciates the help he provides.  Pt shares that [name] is planning to come around his birthday; his friend [name] is planning to come for his birthday as well; he is looking forward to those visits. Pt wants to transition to sessions every other week at this point.   Encouraged pt to continue with his self care activities and we will meet in  2 weeks for a follow up session.  Interventions: Cognitive Behavioral Therapy  Diagnosis:Adjustment disorder with depressed mood  Plan: Treatment Plan Strengths/Abilities:  Intelligent, Intuitive, Willing to participate in therapy Treatment Preferences:  Outpatient Individual Therapy Statement of Needs:  Patient is to use CBT, mindfulness and coping skills to help manage and/or decrease symptoms associated with their diagnosis. Symptoms:  Depressed/Irritable mood, worry, social withdrawal Problems Addressed:  Depressive thoughts, Sadness, Sleep issues, etc. Long Term Goals:  Pt to reduce overall level, frequency, and intensity of the feelings of depression as evidenced by decreased irritability, negative self talk, and helpless feelings from 6 to 7 days/week to 0 to 1 days/week, per client report, for at least 3 consecutive months.  Progress: 30% Short Term Goals:  Pt to verbally express understanding of the relationship between feelings of depression and their impact on thinking patterns and behaviors.  Pt to verbalize an understanding of the role that distorted thinking plays in creating fears, excessive worry, and ruminations.  Progress: 30% Target Date:  07/25/2024 Frequency:  Bi-weekly Modality:  Cognitive Behavioral Therapy Interventions by Therapist:  Therapist will use CBT, Mindfulness exercises, Coping skills and Referrals, as needed by client. Client has verbally approved this treatment plan.  Francis KATHEE Macintosh, [facility]

## 2023-08-15 ENCOUNTER — Ambulatory Visit: Admitting: Psychology

## 2023-08-15 ENCOUNTER — Encounter: Payer: Self-pay | Admitting: Family Medicine

## 2023-08-15 ENCOUNTER — Ambulatory Visit: Payer: Self-pay

## 2023-08-15 ENCOUNTER — Ambulatory Visit (INDEPENDENT_AMBULATORY_CARE_PROVIDER_SITE_OTHER): Admitting: Family Medicine

## 2023-08-15 VITALS — BP 125/84 | HR 74 | Temp 97.7°F | Ht 75.0 in | Wt 202.6 lb

## 2023-08-15 DIAGNOSIS — C61 Malignant neoplasm of prostate: Secondary | ICD-10-CM | POA: Diagnosis not present

## 2023-08-15 DIAGNOSIS — G20A2 Parkinson's disease without dyskinesia, with fluctuations: Secondary | ICD-10-CM | POA: Diagnosis not present

## 2023-08-15 DIAGNOSIS — F411 Generalized anxiety disorder: Secondary | ICD-10-CM

## 2023-08-15 NOTE — Telephone Encounter (Signed)
 You can work him in at 420 today just let him know there could be a wait given the work in.  If chest pain or worsening symptoms seek care in emergency department but I am glad to hear he is improving.  We may not have lab available and may have to have him come back another day if we need blood work

## 2023-08-15 NOTE — Patient Instructions (Addendum)
 Flu shot in the fall  Thrilled you are feeling better- but sorry you had a rough morning- I think you have a very reasonably explanation for how this occurred though  I think moving to friends home is a wonderful idea- we can help with paperwork if needed  Recommended follow up: Return for next already scheduled visit or sooner if needed.

## 2023-08-15 NOTE — Progress Notes (Signed)
 Phone [phone] In person visit   Subjective:   Dan [name] is a 68 y.o. year old very pleasant male patient who presents for/with See problem oriented charting Chief Complaint  Patient presents with   Anxiety   Shortness of Breath   Tremors   Nausea    Started about 7am this morning    Past Medical History-  Patient Active Problem List   Diagnosis Date Noted   Cancer of prostate with intermediate recurrence risk, stage T2B-C or Gleason 7 or prostate-specific antigen (PSA) 10-20 (HCC) 06/01/2022    Priority: High   Hypotestosteronemia 11/05/2015    Priority: High   Parkinson's disease (HCC) 10/30/2014    Priority: High   Low serum vitamin D  04/16/2015    Priority: Medium    Hyperlipidemia 05/24/2010    Priority: Medium    Anxiety state 08/11/2006    Priority: Medium    History of colon polyps 01/08/2018    Priority: Low   Bleeding hemorrhoid 05/24/2010    Priority: Low   Irritable bowel syndrome 08/29/2007    Priority: Low    Medications- reviewed and updated Current Outpatient Medications  Medication Sig Dispense Refill   carbidopa -levodopa  (SINEMET  IR) 25-100 MG tablet Take 2 pills at 6am/8:30/10:30/1pm/4pm May take 1 PRN daily if needed 915 tablet 0   clonazePAM  (KLONOPIN ) 0.5 MG tablet Take 1 tablet (0.5 mg total) by mouth 2 (two) times daily as needed for anxiety. 60 tablet 0   RYTARY  48.75-195 MG CPCR Take 2 tablets by mouth. Six times per day     No current facility-administered medications for this visit.     Objective:  BP 125/84   Pulse 74   Temp 97.7 F (36.5 C)   Ht 6' 3 (1.905 m)   Wt 202 lb 9.6 oz (91.9 kg)   SpO2 96%   BMI 25.32 kg/m  Gen: NAD, resting comfortably CV: RRR no murmurs rubs or gallops Lungs: CTAB no crackles, wheeze, rhonchi Ext: no edema Skin: warm, dry Neuro: Significant tremor, significant imbalance-relies heavily on walker    Assessment and Plan   # Social update-he has made the decision of moving a friend's  home-we will likely receive some paperwork for this.  He will have good support there and will be on 1 level.  Still has caregiver coming in right now. He has a friend susan very supportive driving him to [place]. His long term partner also very supportive and with him today  #PROSTATE CANCER 2024- [facility] Dr[facility] Nicholaus  (will see for follow up ) but treatment by radiation oncology at [facility]  -Reports PSA trending down from 10s to 3s recently- very encouraring!   # Parkinson's disease-followed with Dr. Evonnie previously--now will see  [facility] S: Medication: Sinemet  instant release 2 tablets along with rytary  48.75-195 mg 2 tablets (extended release)- every 2 hours and 1st dose has extra sinemet  in morning after his difficult evenings. Using his walker in the day. Still exercising -patient looking at deep brain stimulator with [name]-has met with neuropsychology as well as neurosurgery and has another step where he will be off carbidopa  levodopa  completely to get a new baseline -trial of Vyalev infusion not helpful with recent attempts plus cost is very high and difficult to wear the pack  -Patient reports had a very difficult evening with the infusion with his pump.  He has to sleep in a chair now and would like to have a bed at friend's home.  As the medicine if  uses it wakes him up wide-awake.  He plans to turn the pump off tonight and do daytime dosing only.  Reports medication is also very expensive and was started at a low dose-the trade outs at 7 AM and 7 PM have been challenging as well-particular with his tremor. -After poor night of sleep which he attributes to [facility] he woke up anxious and later felt shortness of breath with this (now resolved)  and he also had an episode of gagging and dry heaves (second episode-prior when he thought could be related to sertraline ) and he is concerned this could be related to his Parkinson's.  Reports has upcoming swallow study through [facility] - He plans to  only finish current vyalev supply with unimpressive benefit compared to cost.  - he has ready possible that DBS could minimize gagging and dry heaves A/P: Patient is working aggressively with [name] to try to find the best possible solution to his Parkinson's but this has been a very challenging journey. - Reports with medication changes feeling very anxious this morning along with some shortness of breath associated with poor sleep-he is feeling much better now and no longer feeling shortness of breath.  No chest pain and I did offer EKG but they declined today.  No cough to suggest acute illness - With the gagging issues I think a swallow study is reasonable which she reports is already being set up -I think the transition to friend's home will be very helpful as well -He also plans to establish with a new neurologist at [facility] are helping him navigate this but he wants to remain in their network as have been comprehensive source of care  # Insomnia/anxiety S: Medication: Had trial of Zoloft  but they thought it could be causing some GI distress and switched him to clonazepam  through behavioral health-we discussed fall risk with this today -Patient later had another gagging episode as noted above so it is possible it was not related to sertraline  - therapy- working with Francis Macintosh and finds helpful still A/P: Ongoing insomnia and anxiety exacerbated by underlying condition -He reports taking lorazepam  in the daytime and clonazepam  in the evening apparently -We discussed fall risk with this particular with his Parkinson's and running this by [name] neurology-he agrees to discuss with them.  Recommended follow up: Return for next already scheduled visit or sooner if needed. Future Appointments  Date Time Provider Department Center  08/16/2023  8:30 AM Teresa Redell LABOR, NP CP-CP None  08/22/2023 10:00 AM Macintosh Francis NOVAK, [facility] None  08/31/2023 11:00 AM Macintosh Francis NOVAK, [facility] None   09/14/2023 11:00 AM Macintosh Francis NOVAK, [facility] None  09/25/2023 10:00 AM [facility] ANNUAL WELLNESS VISIT 1 [facility] PEC  09/28/2023 11:00 AM Macintosh Francis NOVAK, [facility] None  10/12/2023 11:00 AM Macintosh Francis NOVAK, [facility] None  11/02/2023 11:45 AM Starlet Greig ORN, PT [facility]  11/02/2023 12:00 PM [place], Lauraine HERO, OT [facility]  11/02/2023 12:15 PM Jacelyn Lupita NOVAK, CCC-SLP [facility]  12/22/2023 10:20 AM [name], MD [facility] PEC    Lab/Order associations:   ICD-10-CM   1. Parkinson's disease with fluctuating manifestations, unspecified whether dyskinesia present (HCC)  G20.A2     2. Cancer of prostate with intermediate recurrence risk, stage T2B-C or Gleason 7 or prostate-specific antigen (PSA) 10-20 (HCC)  C61     3. Anxiety state  F41.1      I personally spent a total of 35 minutes in the care  of the patient today including preparing to see the patient, getting/reviewing separately obtained history, performing a medically appropriate exam/evaluation, counseling and educating, and documenting clinical information in the EHR.   Return precautions advised.  Garnette Lukes, MD

## 2023-08-15 NOTE — Telephone Encounter (Signed)
 FYI Only or Action Required?: Action required by provider: request for appointment.  Patient was last seen in primary care on 06/22/2023 by [name], MD.  Called Nurse Triage reporting Shortness of Breath at rest  (pt stated anxiety and Parkinson's.  Symptoms began today.  Interventions attempted: Other: warm fluids.  Symptoms are: gradually improving.  Triage Disposition: See HCP Within 4 Hours (Or PCP Triage)  Patient/caregiver understands and will follow disposition?: want to talk to PCP       Copied from CRM [id number]. Topic: Clinical - Red Word Triage >> Aug 15, 2023  8:23 AM Adelita E wrote: [facility] that prompted transfer to Nurse Triage: Parkinson's side effect, patient stated he woke up with dry mouth, dry heaving, SOB. Also has nausea. Reason for Disposition  [1] MILD difficulty breathing (e.g., minimal/no SOB at rest, SOB with walking, pulse < 100) AND [2] NEW-onset or WORSE than normal  Answer Assessment - Initial Assessment Questions 1. RESPIRATORY STATUS: Describe your breathing? (e.g., wheezing, shortness of breath, unable to speak, severe coughing)      SOB- everything tasted bad- esophageal spasm   2. ONSET: When did this breathing problem begin?      this am  3. PATTERN Does the difficult breathing come and go, or has it been constant since it started?      Panting 4. SEVERITY: How bad is your breathing? (e.g., mild, moderate, severe)      mild 5. RECURRENT SYMPTOM: Have you had difficulty breathing before? If Yes, ask: When was the last time? and What happened that time?      Panting SOB  8. CAUSE: What do you think is causing the breathing problem?      SE Parkinson's  violet pump to replace Parkinson's  9. OTHER SYMPTOMS: Do you have any other symptoms? (e.g., chest pain, cough, dizziness, fever, runny nose)     Metallic taste to food- feels like apple mid stuck, nausea last week d/t Zoloft   now subsided  11. PREGNANCY: Is  there any chance you are pregnant? When was your last menstrual period?       N/a 12. TRAVEL: Have you traveled out of the country in the last month? (e.g., travel history, exposures)       N/a  Protocols used: Breathing Difficulty-A-AH

## 2023-08-15 NOTE — Telephone Encounter (Signed)
Please see triage note

## 2023-08-15 NOTE — Telephone Encounter (Signed)
 Called patient and offered slot today for 4:20. Pt stated he will come in and verbalized understanding that if things get worse he needs to be evaluated in the ED.

## 2023-08-16 ENCOUNTER — Ambulatory Visit (INDEPENDENT_AMBULATORY_CARE_PROVIDER_SITE_OTHER): Admitting: Behavioral Health

## 2023-08-16 ENCOUNTER — Encounter: Payer: Self-pay | Admitting: Behavioral Health

## 2023-08-16 DIAGNOSIS — F410 Panic disorder [episodic paroxysmal anxiety] without agoraphobia: Secondary | ICD-10-CM | POA: Diagnosis not present

## 2023-08-16 DIAGNOSIS — F4321 Adjustment disorder with depressed mood: Secondary | ICD-10-CM

## 2023-08-16 DIAGNOSIS — F411 Generalized anxiety disorder: Secondary | ICD-10-CM | POA: Diagnosis not present

## 2023-08-16 NOTE — Progress Notes (Signed)
 [facility]  Patient ID: Dan [name],  MRN: [id number]  PCP: [name], MD  Date of Evaluation: 08/16/2023 Time spent:30 minutes  Chief Complaint:   HISTORY/CURRENT STATUS: HPI Dan [name], Mclear 68 year old male presents to this office for initial visit and to establish care.  He is accompanied by longtime friend [name] with his verbal consent.  Collateral information should be considered reliable.  Says that he was confused about Ativan  and Klonopin . Says that he will just take Ativan  and bring in the Klonopin  for disposal. Was unable to continue zoloft  due to nausea and vomiting but not sure it actually was related to the medication. Says that it could be related to Parkinson and condition of the esophagus . He would like to remain off an SSRI for now an see how he does over the next few weeks.   He numerically rates depression at 1/10, and anxiety at 5/10.  Says that he sleeps 7 to 8 hours daily.  He denies any history of mania, no psychosis, no auditory or visual hallucinations or delirium.  Acknowledges strong family and friend support group.  Patient is currently retired.  No problems with attention and focus.  Appetite is good.   Prior psychiatric medication trials: Sertraline -only took medication for very short period, not sure of the dose   Individual Medical History/ Review of Systems: Changes? :No   Allergies: Amoxicillin  Current Medications:  Current Outpatient Medications:    carbidopa -levodopa  (SINEMET  IR) 25-100 MG tablet, Take 2 pills at 6am/8:30/10:30/1pm/4pm May take 1 PRN daily if needed, Disp: 915 tablet, Rfl: 0   RYTARY  48.75-195 MG CPCR, Take 2 tablets by mouth. Six times per day, Disp: , Rfl:  Medication Side Effects: none  Family Medical/ Social History: Changes? No  MENTAL HEALTH EXAM:  There were no vitals taken for this visit.There is no height or weight on file to calculate BMI.  General Appearance: Casual, Neat, and Well Groomed  Eye  Contact:  Good  Speech:  Clear and Coherent  Volume:  Normal  Mood:  NA  Affect:  Appropriate  Thought Process:  Coherent  Orientation:  Full (Time, Place, and Person)  Thought Content: Logical   Suicidal Thoughts:  No  Homicidal Thoughts:  No  Memory:  WNL  Judgement:  Fair  Insight:  Fair  Psychomotor Activity:  Tremor and Parkinson's  Concentration:  Concentration: Fair  Recall:  [employer] of Knowledge: Fair  Language: Fair  Assets:  Desire for Improvement Physical Health Resilience Social Support Transportation  ADL's:  Intact  Cognition: WNL  Prognosis:  Poor    DIAGNOSES:    ICD-10-CM   1. Adjustment disorder with depressed mood  F43.21     2. Generalized anxiety disorder  F41.1     3. Panic attack  F41.0       Receiving Psychotherapy: No    RECOMMENDATIONS:  Greater than 50% of 20  min face to face time with patient was spent on counseling and coordination of care. We reviewed his problems with increased anxiety and panic secondary to Parkinson's disease.  Talked about his problems with taking Zoloft  vs symptoms related to Parkinson's. Discussed having only one person being in charge of med administration to avoid confusion in the future. We discussed his ongoing treatment and pending deep brain stimulation with [name].   We agreed today to:   Stopped  sertraline  25 mg for 7 days then 50 mg daily after breakfast.  Research is shown that sertraline   may be one of the safer SSRIs to treat depression and anxiety when accompanied with Parkinson's disease. Will switch from Klonopin  to Ativan  1 mg twice daily as needed for severe anxiety Will follow-up in 6 weeks to reassess Provided emergency contact information To report worsening symptoms or side effects promptly Discussed potential benefits, risk, and side effects of benzodiazepines to include potential risk of tolerance and dependence, as well as possible drowsiness.  Advised patient not to drive if  experiencing drowsiness and to take lowest possible effective dose to minimize risk of dependence and tolerance.  Reviewed PDMP          [name], NP

## 2023-08-22 ENCOUNTER — Ambulatory Visit: Admitting: Psychology

## 2023-08-23 ENCOUNTER — Ambulatory Visit: Payer: Self-pay | Admitting: *Deleted

## 2023-08-23 NOTE — Telephone Encounter (Signed)
 Message from [name] sent at 08/23/2023  9:45 AM EDT  Summary: Irritation around pump   Patients has a pump for Parkinson medication and thinks it may have some irritation around it.          Call History  Contact Date/Time Type Contact Phone/Fax By  08/23/2023 09:38 AM EDT Phone (Incoming) Dan [name], Dan [name] (Self) [phone] Dan [name]   Reason for Disposition  [1] Skin around the wound has become red AND [2] larger than 2 inches (5 cm)  Answer Assessment - Initial Assessment Questions 1. LOCATION: Where is the wound located?      I have a pump for my Parkinson's.   It's on my belly.   I put a fresh cannula in every couple of days.   One of the former puncture spots is red.   I don't think it's infected.   2. WOUND APPEARANCE: What does the wound look like?      It's not swollen or draining.   It's just red.    3. SIZE: If redness is present, ask: What is the size of the red area? (Inches, centimeters, or compare to size of a coin)      It's puncture from my pump.   4. SPREAD: What's changed in the last day?  Do you see any red streaks coming from the wound?     It's still red.    5. ONSET: When did it start to look infected?      Yesterday 8/19 6. MECHANISM: How did the wound start, what was the cause?     See above 7. PAIN: Do you have any pain?  If Yes, ask: How bad is the pain?  (e.g., Scale 1-10; mild, moderate, or severe)     No pain 8. FEVER: Do you have a fever? If Yes, ask: What is your temperature, how was it measured, and when did it start?     No 9. OTHER SYMPTOMS: Do you have any other symptoms? (e.g., shaking chills, weakness, rash elsewhere on body)     No 10. PREGNANCY: Is there any chance you are pregnant? When was your last menstrual period?       N/A  Protocols used: Wound Infection Suspected-A-AH Pt has a pump that infuses medication (Vyalev) for his Parkinson's.  There is a cannula that is inserted into his abd that the  medication is infused through.  (Per pt Dr. Katrinka is aware of this pump).   One of the former puncture holes where the cannula was is now red.   No drainage, bleeding or pain.  Redness was noted 08/22/2023.   No appts with Dr. Katrinka until 9/16.   Since Dr. Katrinka is familiar with his set he would really rather see Dr. Katrinka.    Message being sent to see if Dr. Katrinka can work him him. Pt. Agreeable to this plan.       FYI Only or Action Required?: Action required by provider: request for appointment.  Patient was last seen in primary care on 08/15/2023 by Dan Garnette KIDD, MD.  Called Nurse Triage reporting Wound Infection. Puncture wound from cannula that goes to his infusion pump is looking red.   It's a former insertion site not the one the cannula is now in.    Symptoms began yesterday.Noticed 8/19 that it's red.  Interventions attempted: Put some antibiotic ointment on it last night and covered it with a Band Aid.   Symptoms are: gradually worsening.  Triage Disposition: See HCP Within 4 Hours (Or PCP Triage)  Patient/caregiver understands and will follow disposition?: Yes High priority message sent to see if he can be worked in to see Dr. Katrinka as soon as possible.

## 2023-08-23 NOTE — Telephone Encounter (Signed)
 I would like to work him (but I have vacation days on 8/21 and 8/22) but the soonest I could do would be Monday at 11:40 AM and there could be a long wait-would he be open to see one of my colleagues?

## 2023-08-23 NOTE — Telephone Encounter (Signed)
 Please see message below

## 2023-08-24 ENCOUNTER — Encounter: Payer: Self-pay | Admitting: Internal Medicine

## 2023-08-24 ENCOUNTER — Ambulatory Visit (INDEPENDENT_AMBULATORY_CARE_PROVIDER_SITE_OTHER): Admitting: Internal Medicine

## 2023-08-24 VITALS — BP 132/72 | HR 66 | Temp 98.0°F | Ht 75.0 in | Wt 197.2 lb

## 2023-08-24 DIAGNOSIS — L089 Local infection of the skin and subcutaneous tissue, unspecified: Secondary | ICD-10-CM | POA: Diagnosis not present

## 2023-08-24 DIAGNOSIS — T8140XA Infection following a procedure, unspecified, initial encounter: Secondary | ICD-10-CM

## 2023-08-24 DIAGNOSIS — G20A2 Parkinson's disease without dyskinesia, with fluctuations: Secondary | ICD-10-CM | POA: Diagnosis not present

## 2023-08-24 MED ORDER — DOXYCYCLINE HYCLATE 100 MG PO TABS: 100.0000 mg | ORAL_TABLET | Freq: Two times a day (BID) | ORAL | 0 refills | Status: DC

## 2023-08-24 NOTE — Telephone Encounter (Signed)
 Scheduled to see Bernardino Cone 08/24/2023.

## 2023-08-24 NOTE — Patient Instructions (Signed)
 It was a pleasure seeing you today! Your health and satisfaction are our top priorities.  Dan Cone, MD  VISIT SUMMARY: Today, we evaluated the irritation at your medication pump cannula site. We also reviewed your advanced Parkinson's disease management and discussed your overall health and lifestyle.  YOUR PLAN: -IRRITATION AT MEDICATION PUMP CANNULA SITE: The irritation at your cannula site is likely due to mechanical factors such as rubbing against a table or seatbelt use. There are no signs of infection like heat or pain, but to be cautious, we have prescribed doxycycline  in case an infection develops. Please monitor the site for any changes and continue to change the cannula daily as previously advised.  -PARKINSON'S DISEASE: Parkinson's disease is a progressive nervous system disorder that affects movement. You have been managing it with a medication pump for about ten years and maintain a level of independence with the help of your partner and caregiver. Continue with your current management plan and seek assistance as needed for tasks that are challenging due to tremors.  INSTRUCTIONS: Please take the prescribed doxycycline  as directed and monitor the cannula site for any signs of infection, such as increased redness, heat, or pain. Continue changing the cannula daily. Follow up with us  if you notice any concerning symptoms or if you have any questions about your medication pump or Parkinson's disease management.  Your Providers PCP: [name], MD,  [phone]) Referring Provider: [name], MD,  [phone]) Care Team Provider: Evonnie Asberry RAMAN, DO,  [phone]) Care Team Provider: Aliene LITTIE Britain Care Team Provider: Dr. Addie Meylor  NEXT STEPS: [x]  Early Intervention: Schedule sooner appointment, call our on-call services, or go to emergency room if there is any significant Increase in pain or discomfort New or worsening symptoms Sudden or severe changes in  your health [x]  Flexible Follow-Up: We recommend a No follow-ups on file. for optimal routine care. This allows for progress monitoring and treatment adjustments. [x]  Preventive Care: Schedule your annual preventive care visit! It's typically covered by insurance and helps identify potential health issues early. [x]  Lab & X-ray Appointments: Incomplete tests scheduled today, or call to schedule. X-rays: [facility] (M-F, 8:30am-noon or 1pm-5pm). [x]  Medical Information Release: Sign a release form at front desk to obtain relevant medical information we don't have.  MAKING THE MOST OF OUR FOCUSED 20 MINUTE APPOINTMENTS: [x]   Clearly state your top concerns at the beginning of the visit to focus our discussion [x]   If you anticipate you will need more time, please inform the front desk during scheduling - we can book multiple appointments in the same week. [x]   If you have transportation problems- use our convenient video appointments or ask about transportation support. [x]   We can get down to business faster if you use MyChart to update information before the visit and submit non-urgent questions before your visit. Thank you for taking the time to provide details through MyChart.  Let our nurse know and she can import this information into your encounter documents.  Arrival and Wait Times: [x]   Arriving on time ensures that everyone receives prompt attention. [x]   Early morning (8a) and afternoon (1p) appointments tend to have shortest wait times. [x]   Unfortunately, we cannot delay appointments for late arrivals or hold slots during phone calls.  Getting Answers and Following Up [x]   Simple Questions & Concerns: For quick questions or basic follow-up after your visit, reach us  at [phone] or MyChart messaging. [x]   Complex Concerns:  If your concern is more complex, scheduling an appointment might be best. Discuss this with the staff to find the most suitable option. [x]    Lab & Imaging Results: We'll contact you directly if results are abnormal or you don't use MyChart. Most normal results will be on MyChart within 2-3 business days, with a review message from Dr. Jesus. Haven't heard back in 2 weeks? Need results sooner? Contact us  at [phone]. [x]   Referrals: Our referral coordinator will manage specialist referrals. The specialist's office should contact you within 2 weeks to schedule an appointment. Call us  if you haven't heard from them after 2 weeks.  Staying Connected [x]   MyChart: Activate your MyChart for the fastest way to access results and message us . See the last page of this paperwork for instructions on how to activate.  Bring to Your Next Appointment [x]   Medications: Please bring all your medication bottles to your next appointment to ensure we have an accurate record of your prescriptions. [x]   Health Diaries: If you're monitoring any health conditions at home, keeping a diary of your readings can be very helpful for discussions at your next appointment.  Billing [x]   X-ray & Lab Orders: These are billed by separate companies. Contact the invoicing company directly for questions or concerns. [x]   Visit Charges: Discuss any billing inquiries with our administrative services team.  Your Satisfaction Matters [x]   Share Your Experience: We strive for your satisfaction! If you have any complaints, or preferably compliments, please let Dr. Jesus know directly or contact our Practice Administrators, Manuelita Rubin or [employer], by asking at the front desk.   Reviewing Your Records [x]   Review this early draft of your clinical encounter notes below and the final encounter summary tomorrow on MyChart after its been completed.  All orders placed so far are visible here: Skin infection -     Doxycycline  Hyclate; Take 1 tablet (100 mg total) by mouth 2 (two) times daily.  Dispense: 20 tablet; Refill: 0  Infection of cannulation  site  Parkinson's disease with fluctuating manifestations, unspecified whether dyskinesia present (HCC)

## 2023-08-24 NOTE — Assessment & Plan Note (Signed)
 He has advanced Parkinson's disease managed with a medication pump and has been living with the condition for approximately ten years. He maintains a level of independence with caregiver support. Challenges include managing the pump due to tremors, with assistance from his partner and caregiver.

## 2023-08-24 NOTE — Progress Notes (Signed)
 ==============================  [facility]: [phone]   -- Medical Office Visit --  Patient: Dan [name]      Age: 68 y.o.       Sex:  male  Date:   08/24/2023 Today's Healthcare Provider: Bernardino KANDICE Cone, MD  ==============================   Chief Complaint: spot on stomach (Concerned about spot on stomach from the pump)   Discussed the use of AI scribe software for clinical note transcription with the patient, who gave verbal consent to proceed.  History of Present Illness  68 year old male with advanced Parkinson's disease who presents for evaluation of a potential cannula site infection.  He uses a medication delivery system called Biolev, V1A, which involves a pump and a cannula. The cannula site was changed a couple of days ago and appears irritated. There is no heat or pain at the site. The patient reports that the area may have been irritated by rubbing against a table edge. His partner, [name], is concerned about a potential infection.  He uses the [place] pump with assistance from his caregiver, [name], in the morning, and his partner, [name], in the evening. The pump runs for twelve hours, and [name] removes it at 7 PM for showering. Initially, the cannula was recommended to be changed every three days, but due to infection concerns, it is now changed daily.  He has a history of cancer treated with radiation at [facility], which resulted in fatigue and weight gain. He has since returned to the gym and is walking, leading to weight loss from 215 lbs to 197 lbs. He feels good overall and lives fairly independently despite having Parkinson's for about ten years.  He experiences hand tremors, making it difficult to type, and he never learned how to type. He has never taken antibiotics for skin infections.   Background Reviewed: Problem List: has Anxiety state; Irritable bowel syndrome; Hyperlipidemia; Bleeding hemorrhoid; Parkinson's disease (HCC);  Low serum vitamin D ; Hypotestosteronemia; History of colon polyps; and Cancer of prostate with intermediate recurrence risk, stage T2B-C or Gleason 7 or prostate-specific antigen (PSA) 10-20 (HCC) on their problem list. Past Medical History:  has a past medical history of Allergy, Anxiety, Bleeding hemorrhoid (05/24/2010), Depression (08/11/2006), Hyperlipidemia LDL goal < 130 (05/24/2010), Irritable bowel syndrome (08/29/2007), Parkinson disease (HCC), and Seizures (HCC). Past Surgical History:   has a past surgical history that includes Appendectomy; Colonoscopy (05-01-00); Hernia repair (2014); Tonsillectomy (1958); and Root canal. Social History:   reports that he has never smoked. He has never used smokeless tobacco. He reports that he does not currently use alcohol. He reports that he does not use drugs. Family History:  family history includes ADD / ADHD in his brother; Bipolar disorder in his brother; Breast cancer in his mother; Depression in his mother; Hypertension in his mother; OCD in his brother; Ovarian cancer in his mother; Prostate cancer in his father. Allergies:  is allergic to amoxicillin.   Medication Reconciliation: Current Outpatient Medications on File Prior to Visit  Medication Sig   carbidopa -levodopa  (SINEMET  IR) 25-100 MG tablet Take 2 pills at 6am/8:30/10:30/1pm/4pm May take 1 PRN daily if needed   LORazepam  (ATIVAN ) 0.5 MG tablet Take 0.5 mg by mouth every 8 (eight) hours as needed for anxiety.   RYTARY  48.75-195 MG CPCR Take 2 tablets by mouth. Six times per day   No current facility-administered medications on file prior to visit.  There are no discontinued medications.   Physical Exam:  08/24/2023   10:22 AM 08/15/2023    4:02 PM 07/28/2023    2:39 PM  Vitals with BMI  Height 6' 3 6' 3   Weight 197 lbs 3 oz 202 lbs 10 oz   BMI 24.65 25.32   Systolic 132 125   Diastolic 72 84   Pulse 66 74      Information is confidential and restricted. Go to Review  Flowsheets to unlock data.  Vital signs reviewed.  Nursing notes reviewed. Weight trend reviewed. Physical Activity: Sufficiently Active (09/19/2022)   Exercise Vital Sign    Days of Exercise per Week: 3 days    Minutes of Exercise per Session: 50 min   General Appearance:  No acute distress appreciable.   Well-groomed, healthy-appearing male.  Well proportioned with no abnormal fat distribution.  Good muscle tone. Pulmonary:  Normal work of breathing at rest, no respiratory distress apparent. SpO2: 98 %  Musculoskeletal: All extremities are intact.  Neurological:  Awake, alert, oriented, and engaged.  No obvious focal neurological deficits or cognitive impairments.  Sensorium seems unclouded.   Speech is clear and coherent with logical content. Psychiatric:  Appropriate mood, pleasant and cooperative demeanor, thoughtful and engaged during the exam Photographs Taken 08/24/2023 :           ASSESSMENT & PLAN   Assessment & Plan Skin infection Infection of cannulation site Irritation and possible infection at the cannula site is likely due to mechanical factors such as seatbelt use and table contact. There are no signs of infection like heat or pain, though the partner is concerned about potential infection. Differential diagnosis includes infection, but current presentation suggests irritation. Prescribe doxycycline  as a precautionary measure in case of infection development. Parkinson's disease with fluctuating manifestations, unspecified whether dyskinesia present [facility]) He has advanced Parkinson's disease managed with a medication pump and has been living with the condition for approximately ten years. He maintains a level of independence with caregiver support. Challenges include managing the pump due to tremors, with assistance from his partner and caregiver.  ORDER ASSOCIATIONS  #   DIAGNOSIS / CONDITION ICD-10 ENCOUNTER ORDER     ICD-10-CM   1. Skin infection  L08.9 doxycycline   (VIBRA -TABS) 100 MG tablet     Meds ordered this encounter  Medications   doxycycline  (VIBRA -TABS) 100 MG tablet    Sig: Take 1 tablet (100 mg total) by mouth 2 (two) times daily.    Dispense:  20 tablet    Refill:  0        This document was synthesized by artificial intelligence ([employer]) using HIPAA-compliant recording of the clinical interaction;   We discussed the use of AI scribe software for clinical note transcription with the patient, who gave verbal consent to proceed. additional Info: This encounter employed state-of-the-art, real-time, collaborative documentation. The patient actively reviewed and assisted in updating their electronic medical record on a shared screen, ensuring transparency and facilitating joint problem-solving for the problem list, overview, and plan. This approach promotes accurate, informed care. The treatment plan was discussed and reviewed in detail, including medication safety, potential side effects, and all patient questions. We confirmed understanding and comfort with the plan. Follow-up instructions were established, including contacting the office for any concerns, returning if symptoms worsen, persist, or new symptoms develop, and precautions for potential emergency department visits.

## 2023-08-25 ENCOUNTER — Ambulatory Visit: Admitting: Behavioral Health

## 2023-08-28 ENCOUNTER — Telehealth: Payer: Self-pay | Admitting: Family Medicine

## 2023-08-28 NOTE — Telephone Encounter (Signed)
 Patient dropped off document Medical Form, to be filled out by provider. Patient requested to send it back via Fax within 5-days. Document is located in providers tray at front office.Please advise at Mobile [phone] (mobile)

## 2023-08-29 ENCOUNTER — Ambulatory Visit: Admitting: Psychology

## 2023-08-29 NOTE — Telephone Encounter (Signed)
 Medical form completed and faxed back.

## 2023-08-31 ENCOUNTER — Ambulatory Visit: Admitting: Psychology

## 2023-08-31 DIAGNOSIS — F4321 Adjustment disorder with depressed mood: Secondary | ICD-10-CM

## 2023-08-31 NOTE — Progress Notes (Addendum)
 [facility] Counselor/Therapist Progress Note  Patient ID: Dan [name], MRN: [id number],    Date: 08/31/2023  Time Spent:  45 mins    start time: 1100    end time: 1145  Treatment Type: Individual Therapy  Reported Symptoms: Pt presents in person in the office, granting consent for the session.  Mental Status Exam: Appearance:  Casual     Behavior: Appropriate  Motor: Normal  Speech/Language:  Clear and Coherent  Affect: Appropriate  Mood: normal  Thought process: normal  Thought content:   WNL  Sensory/Perceptual disturbances:   WNL  Orientation: oriented to person, place, and time/date  Attention: Good  Concentration: Good  Memory: WNL  Fund of knowledge:  Good  Insight:   Good  Judgment:  Good  Impulse Control: Good   Risk Assessment: Danger to Self:  No Self-injurious Behavior: No Danger to Others: No Duty to Warn:no Physical Aggression / Violence:No  Access to Firearms a concern: No  Gang Involvement:No   Subjective: Pt shares that I am moving into [facility] on October 1st.  I think it is time for me to make this decision.  There is a lot of stuff going on right now; I am having to downsize a lot of things right now and [name]'s parents are being very helpful.  I am going to put my house on the market after I move out.  I will sell some of my furniture that I am not taking with me.  I have two appts at [facility] on 9/4 for the deep brain stimulator and I have to go without my meds so [name] will have to go with me.  [name] is coming on 9/6 for 5 days and his friend [name] is coming back for a visit on 9/9 for several days.  We will celebrate my birthday when they are all here.   Pt knows it will be an adjustment for him to move to [facility] but he likes the support that he will have there, even though he will start out in independent living.  His apt will face the garden and it will be on the first floor.  He is using the pump to give him his medication for  his Parkinson's and it is working pretty well for him during the day.  Pt is going to see his Firefighter tomorrow.  The rent at [facility] will be $2000.00 per month and that includes his food and the amenities.  Pt shares that he is taking Ativan  from [facility] and it is helping him.  Pt shares that [name] is supportive of pt moving to [facility] because he is not a care giver for pt.  Pt is going to the gym once per week and he enjoys that.  His PSA continues to drop and he is happy about that.  Pt shares that he is feeling grateful for what is going on in his life right now.  Encouraged pt to continue with his self care activities and we will meet in  2 weeks for a follow up session.  Interventions: Cognitive Behavioral Therapy  Diagnosis:Adjustment disorder with depressed mood  Plan: Treatment Plan Strengths/Abilities:  Intelligent, Intuitive, Willing to participate in therapy Treatment Preferences:  Outpatient Individual Therapy Statement of Needs:  Patient is to use CBT, mindfulness and coping skills to help manage and/or decrease symptoms associated with their diagnosis. Symptoms:  Depressed/Irritable mood, worry, social withdrawal Problems Addressed:  Depressive thoughts, Sadness, Sleep issues, etc.  Long Term Goals:  Pt to reduce overall level, frequency, and intensity of the feelings of depression as evidenced by decreased irritability, negative self talk, and helpless feelings from 6 to 7 days/week to 0 to 1 days/week, per client report, for at least 3 consecutive months.  Progress: 30% Short Term Goals:  Pt to verbally express understanding of the relationship between feelings of depression and their impact on thinking patterns and behaviors.  Pt to verbalize an understanding of the role that distorted thinking plays in creating fears, excessive worry, and ruminations.  Progress: 30% Target Date:  07/25/2024 Frequency:  Bi-weekly Modality:  Cognitive Behavioral  Therapy Interventions by Therapist:  Therapist will use CBT, Mindfulness exercises, Coping skills and Referrals, as needed by client. Client has verbally approved this treatment plan.  Francis KATHEE Macintosh, [facility]

## 2023-09-07 ENCOUNTER — Ambulatory Visit: Admitting: Psychology

## 2023-09-07 DIAGNOSIS — G20B2 Parkinson's disease with dyskinesia, with fluctuations: Secondary | ICD-10-CM | POA: Diagnosis not present

## 2023-09-08 ENCOUNTER — Telehealth: Payer: Self-pay | Admitting: Family Medicine

## 2023-09-08 NOTE — Telephone Encounter (Signed)
 Spoke with Dan [name] and stated there was no error and Dr. Katrinka feels as though the patient should not be living independent and he will need help with personal hygiene. Dan [name] verbalized understanding and no further questions at this time.

## 2023-09-08 NOTE — Telephone Encounter (Unsigned)
 Copied from CRM [id number]. Topic: General - Other >> Sep 08, 2023  2:42 PM Gennette ORN wrote: Reason for CRM: Zebedee [phone] is calling to let us  know she is faxing this form back it has errors over needs to be corrected. The error assistant living but he stays independent. They also stated he needs help with taking a bath but he does this on his own.

## 2023-09-10 ENCOUNTER — Telehealth: Payer: Self-pay | Admitting: Behavioral Health

## 2023-09-10 DIAGNOSIS — F411 Generalized anxiety disorder: Secondary | ICD-10-CM

## 2023-09-10 DIAGNOSIS — F410 Panic disorder [episodic paroxysmal anxiety] without agoraphobia: Secondary | ICD-10-CM

## 2023-09-11 ENCOUNTER — Telehealth: Payer: Self-pay | Admitting: Family Medicine

## 2023-09-11 ENCOUNTER — Encounter: Payer: Self-pay | Admitting: Family Medicine

## 2023-09-11 NOTE — Telephone Encounter (Signed)
 He last filled clonazepam  8/5, but had a RF available for alprazolam.

## 2023-09-11 NOTE — Telephone Encounter (Signed)
 Next visit is 10/16/23. Renato called from [facility] on [facility] and said the pharmacy was waiting for an RX for Lorazepam  0.5 mg. HT had not gotten it yet. One of the pharmacy reps got the Lorazepam  0.5 mg ready for him and he now has received it. Arvind's number is [phone].

## 2023-09-11 NOTE — Telephone Encounter (Signed)
 Called patient to speak about paperwork and he stated he can live independently and he will be bringing us  the paperwork to redo and send back to [facility].    Copied from CRM #[id number]. Topic: Clinical - Medical Advice >> Sep 11, 2023  9:27 AM Precious C wrote: Reason for CRM: Patient called regarding a form that was completed by his provider. He stated the form indicated that he was not independent and required care. Patient is confused, as he reports being very independent - he cooks, cleans, and handles everything on his own. He is requesting clarification on why the form was completed in this manner and would like a callback from his provider to discuss what to do moving forward. Callback [phone]

## 2023-09-12 ENCOUNTER — Other Ambulatory Visit: Payer: Self-pay

## 2023-09-12 DIAGNOSIS — F411 Generalized anxiety disorder: Secondary | ICD-10-CM

## 2023-09-12 DIAGNOSIS — F410 Panic disorder [episodic paroxysmal anxiety] without agoraphobia: Secondary | ICD-10-CM

## 2023-09-13 ENCOUNTER — Telehealth: Payer: Self-pay | Admitting: Behavioral Health

## 2023-09-13 MED ORDER — LORAZEPAM 0.5 MG PO TABS: 0.5000 mg | ORAL_TABLET | Freq: Two times a day (BID) | ORAL | 0 refills | Status: DC | PRN

## 2023-09-13 NOTE — Telephone Encounter (Signed)
 Has been pended to [employer]

## 2023-09-13 NOTE — Telephone Encounter (Signed)
  Pt called in req rf on Lorazepam      [facility] AT [facility]

## 2023-09-14 ENCOUNTER — Ambulatory Visit: Admitting: Psychology

## 2023-09-19 DIAGNOSIS — G20B2 Parkinson's disease with dyskinesia, with fluctuations: Secondary | ICD-10-CM | POA: Diagnosis not present

## 2023-09-21 DIAGNOSIS — G20B2 Parkinson's disease with dyskinesia, with fluctuations: Secondary | ICD-10-CM | POA: Diagnosis not present

## 2023-09-25 ENCOUNTER — Ambulatory Visit (INDEPENDENT_AMBULATORY_CARE_PROVIDER_SITE_OTHER): Payer: Medicare HMO

## 2023-09-25 VITALS — BP 126/70 | HR 76 | Temp 98.5°F | Ht 75.0 in | Wt 197.0 lb

## 2023-09-25 DIAGNOSIS — Z1211 Encounter for screening for malignant neoplasm of colon: Secondary | ICD-10-CM

## 2023-09-25 DIAGNOSIS — Z23 Encounter for immunization: Secondary | ICD-10-CM | POA: Diagnosis not present

## 2023-09-25 DIAGNOSIS — Z0000 Encounter for general adult medical examination without abnormal findings: Secondary | ICD-10-CM

## 2023-09-25 DIAGNOSIS — Z860100 Personal history of colon polyps, unspecified: Secondary | ICD-10-CM | POA: Diagnosis not present

## 2023-09-25 NOTE — Progress Notes (Signed)
 Subjective:   Dan [name] is a 68 y.o. who presents for a Medicare Wellness preventive visit.  As a reminder, Annual Wellness Visits don't include a physical exam, and some assessments may be limited, especially if this visit is performed virtually. We may recommend an in-person follow-up visit with your provider if needed.  Visit Complete: In person    Persons Participating in Visit: Patient.  AWV Questionnaire: No: Patient Medicare AWV questionnaire was not completed prior to this visit.  Cardiac Risk Factors include: advanced age (>72men, >64 women);male gender;dyslipidemia     Objective:    Today's Vitals   09/25/23 1458  BP: 126/70  Pulse: 76  Temp: 98.5 F (36.9 C)  TempSrc: Temporal  SpO2: 95%  Weight: 197 lb (89.4 kg)  Height: 6' 3 (1.905 m)   Body mass index is 24.62 kg/m.     09/25/2023    3:03 PM 09/19/2022   10:37 AM 06/29/2022   10:58 AM 12/08/2021   11:06 AM 11/11/2021    2:02 PM 09/17/2021    8:55 AM 06/28/2021    8:10 AM  Advanced Directives  Does Patient Have a Medical Advance Directive? Yes Yes Yes No Yes Yes Yes  Type of Estate agent of [place];Living will Healthcare Power of [place];Living will Living will  Healthcare Power of [place];Living will Healthcare Power of [place];Living will Healthcare Power of [place];Living will;Out of facility DNR (pink MOST or yellow form)  Does patient want to make changes to medical advance directive?     No - Patient declined    Copy of Healthcare Power of Attorney in Chart? No - copy requested No - copy requested    No - copy requested     Current Medications (verified) Outpatient Encounter Medications as of 09/25/2023  Medication Sig   carbidopa -levodopa  (SINEMET  IR) 25-100 MG tablet Take 2 pills at 6am/8:30/10:30/1pm/4pm May take 1 PRN daily if needed   LORazepam  (ATIVAN ) 0.5 MG tablet Take 1 tablet (0.5 mg total) by mouth 2 (two) times daily as needed for anxiety.   RYTARY   48.75-195 MG CPCR Take 2 tablets by mouth. Six times per day   [DISCONTINUED] doxycycline  (VIBRA -TABS) 100 MG tablet Take 1 tablet (100 mg total) by mouth 2 (two) times daily.   No facility-administered encounter medications on file as of 09/25/2023.    Allergies (verified) Amoxicillin and Sertraline    History: Past Medical History:  Diagnosis Date   Allergy    seasonal   Anxiety    Bleeding hemorrhoid 05/24/2010   Depression 08/11/2006   Anxiety as well around down market- tough time with job loss in [place]     Hyperlipidemia LDL goal < 130 05/24/2010   Irritable bowel syndrome 08/29/2007   Improved with diet change     Parkinson disease (HCC)    since 2016   Seizures (HCC)    Past Surgical History:  Procedure Laterality Date   APPENDECTOMY     COLONOSCOPY  05-01-00   HERNIA REPAIR  2014   Bil inguinal hernia repair   ROOT CANAL     TONSILLECTOMY  1958   Family History  Problem Relation Age of Onset   Depression Mother    Hypertension Mother    Breast cancer Mother    Ovarian cancer Mother        in her 35s   Prostate cancer Father        complications from radioactive seeds, colostomy for last 10 years   Bipolar disorder  Brother    ADD / ADHD Brother    OCD Brother    Social History   Socioeconomic History   Marital status: Single    Spouse name: Not on file   Number of children: 0   Years of education: Not on file   Highest education level: Bachelor's degree (e.g., BA, AB, BS)  Occupational History   Occupation: Publishing copy    Comment: works at [employer]   Occupation: Retired    Comment: Armed forces operational officer  Tobacco Use   Smoking status: Never   Smokeless tobacco: Never  Vaping Use   Vaping status: Never Used  Substance and Sexual Activity   Alcohol use: Not Currently    Comment: social   Drug use: No   Sexual activity: Not Currently  Other Topics Concern   Not on file  Social History Narrative   Lives alone. Long term partner  - does not live with him. Home is two levels      Retired  September 2023 from [employer].    Formerly Development worker, community.    Goes to cong[employer]ional [employer]      Hobbies: Exercises regularly, relax with friends   Right handed   Caffeine 1 cup daily   Social Drivers of Health   Financial Resource Strain: Low Risk  (09/25/2023)   Overall Financial Resource Strain (CARDIA)    Difficulty of Paying Living Expenses: Not hard at all  Food Insecurity: No Food Insecurity (09/25/2023)   Hunger Vital Sign    Worried About Running Out of Food in the Last Year: Never true    Ran Out of Food in the Last Year: Never true  Transportation Needs: No Transportation Needs (09/25/2023)   PRAPARE - Administrator, Civil Service (Medical): No    Lack of Transportation (Non-Medical): No  Physical Activity: Sufficiently Active (09/25/2023)   Exercise Vital Sign    Days of Exercise per Week: 3 days    Minutes of Exercise per Session: 60 min  Stress: No Stress Concern Present (09/25/2023)   [employer] of Occupational Health - Occupational Stress Questionnaire    Feeling of Stress: Not at all  Social Connections: Unknown (09/25/2023)   Social Connection and Isolation Panel    Frequency of Communication with Friends and Family: More than three times a week    Frequency of Social Gatherings with Friends and Family: More than three times a week    Attends Religious Services: Not on Marketing executive or Organizations: No    Attends Banker Meetings: Never    Marital Status: Never married    Tobacco Counseling Counseling given: Not Answered    Clinical Intake:  Pre-visit preparation completed: Yes  Pain : No/denies pain     BMI - recorded: 24.62 Nutritional Status: BMI of 19-24  Normal Nutritional Risks: None Diabetes: No  Lab Results  Component Value Date   HGBA1C 6.2 06/22/2023     How often do you need  to have someone help you when you read instructions, pamphlets, or other written materials from your doctor or pharmacy?: 1 - Never  Interpreter Needed?: No  Information entered by :: Ellouise Haws, LPN   Activities of Daily Living     09/25/2023    3:07 PM  In your present state of health, do you have any difficulty performing the following activities:  Hearing? 0  Vision? 0  Difficulty concentrating  or making decisions? 0  Walking or climbing stairs? 0  Dressing or bathing? 0  Doing errands, shopping? 0  Preparing Food and eating ? N  Using the Toilet? N  In the past six months, have you accidently leaked urine? N  Do you have problems with loss of bowel control? N  Managing your Medications? N  Managing your Finances? N  Housekeeping or managing your Housekeeping? N    Patient Care Team: [name], MD as PCP - General (Family Medicine) Tat, Asberry RAMAN, DO as Consulting Physician (Neurology) Aliene LITTIE Britain as Consulting Physician (Dentistry) Dr. Addie Moles as Consulting Physician (Chiropractic Medicine)  I have updated your Care Teams any recent Medical Services you may have received from other providers in the past year.     Assessment:   This is a routine wellness examination for Dan [name].  Hearing/Vision screen Hearing Screening - Comments:: Pt denies any hearing issues  Vision Screening - Comments:: Wears rx glasses - up to date with routine eye exams with Dr Glade Pan    Goals Addressed             This Visit's Progress    Patient Stated       Continue going to the gym and work out daily        Depression Screen     09/25/2023    3:03 PM 12/15/2022    9:37 AM 09/19/2022   10:38 AM 06/16/2022    9:53 AM 09/17/2021    8:53 AM 06/03/2021   10:18 AM 12/05/2019    8:20 AM  PHQ 2/9 Scores  PHQ - 2 Score 0 0 0 0 0 0 0  PHQ- 9 Score 0 3 0 2  4     Fall Risk     09/25/2023    3:05 PM 12/15/2022    9:36 AM 09/19/2022   10:40 AM 06/29/2022   10:59  AM 06/16/2022    9:47 AM  Fall Risk   Falls in the past year? 0 0 0 0 0  Number falls in past yr: 0 0 0 0 0  Injury with Fall? 0 0 0 0 0  Risk for fall due to : Impaired balance/gait;Impaired mobility No Fall Risks Impaired vision;Impaired balance/gait  No Fall Risks  Risk for fall due to: Comment   use cane for safety    Follow up Falls prevention discussed Falls evaluation completed Falls prevention discussed Falls evaluation completed Falls evaluation completed    MEDICARE RISK AT HOME:  Medicare Risk at Home Any stairs in or around the home?: Yes (also has a lift) If so, are there any without handrails?: No Home free of loose throw rugs in walkways, pet beds, electrical cords, etc?: Yes Adequate lighting in your home to reduce risk of falls?: Yes Life alert?: No Use of a cane, walker or w/c?: Yes Grab bars in the bathroom?: Yes Shower chair or bench in shower?: Yes Elevated toilet seat or a handicapped toilet?: No  TIMED UP AND GO:  Was the test performed?  Yes  Length of time to ambulate 10 feet: 20 sec Gait steady and fast with assistive device  Cognitive Function: 6CIT completed        09/25/2023    3:06 PM 09/19/2022   10:41 AM 09/17/2021    8:59 AM  6CIT Screen  What Year? 0 points 0 points 0 points  What month? 0 points 0 points 0 points  What time? 0 points 0  points 0 points  Count back from 20 0 points 0 points 0 points  Months in reverse 0 points 0 points 0 points  Repeat phrase 0 points 0 points 0 points  Total Score 0 points 0 points 0 points    Immunizations Immunization History  Administered Date(s) Administered   Fluad Quad(high Dose 65+) 08/03/2021   Fluad Trivalent(High Dose 65+) 09/19/2022   INFLUENZA, HIGH DOSE SEASONAL PF 10/24/2020, 09/25/2023   Influenza Whole 01/03/2002   Influenza,inj,Quad PF,6+ Mos 08/19/2017, 09/04/2018   Influenza-Unspecified 09/21/2014, 10/08/2015, 08/31/2016, 10/04/2019   PFIZER(Purple Top)SARS-COV-2 Vaccination  03/19/2019, 04/15/2019, 10/15/2019   PNEUMOCOCCAL CONJUGATE-20 06/03/2021   Pfizer Covid-19 Vaccine Bivalent Booster 16yrs & up 10/24/2020   Td 10/03/1996, 11/05/2007   Tdap 11/10/2017   Zoster Recombinant(Shingrix) 05/25/2016, 10/20/2016   Zoster, Live 11/18/2015    Screening Tests Health Maintenance  Topic Date Due   Colonoscopy  12/23/2022   COVID-19 Vaccine (5 - 2025-26 season) 09/04/2023   Medicare Annual Wellness (AWV)  09/19/2023   DTaP/Tdap/Td (4 - Td or Tdap) 11/11/2027   Pneumococcal Vaccine: 50+ Years  Completed   Influenza Vaccine  Completed   Hepatitis C Screening  Completed   Zoster Vaccines- Shingrix  Completed   HPV VACCINES  Aged Out   Meningococcal B Vaccine  Aged Out    Health Maintenance Items Addressed: See Nurse Notes at the end of this note  Additional Screening:  Vision Screening: Recommended annual ophthalmology exams for early detection of glaucoma and other disorders of the eye. Is the patient up to date with their annual eye exam?  Yes  Who is the provider or what is the name of the office in which the patient attends annual eye exams? Dr Glade Pan   Dental Screening: Recommended annual dental exams for proper oral hygiene  Community Resource Referral / Chronic Care Management: CRR required this visit?  No   CCM required this visit?  No   Plan:    I have personally reviewed and noted the following in the patient's chart:   Medical and social history Use of alcohol, tobacco or illicit drugs  Current medications and supplements including opioid prescriptions. Patient is not currently taking opioid prescriptions. Functional ability and status Nutritional status Physical activity Advanced directives List of other physicians Hospitalizations, surgeries, and ER visits in previous 12 months Vitals Screenings to include cognitive, depression, and falls Referrals and appointments  In addition, I have reviewed and discussed with patient  certain preventive protocols, quality metrics, and best practice recommendations. A written personalized care plan for preventive services as well as general preventive health recommendations were provided to patient.   Ellouise VEAR Haws, LPN   [id number]   After Visit Summary: (In Person-Printed) AVS printed and given to the patient  Notes: Nothing significant to report at this time.

## 2023-09-25 NOTE — Patient Instructions (Addendum)
 Dan [name],  Thank you for taking the time for your Medicare Wellness Visit. I appreciate your continued commitment to your health goals. Please review the care plan we discussed, and feel free to reach out if I can assist you further.  Medicare recommends these wellness visits once per year to help you and your care team stay ahead of potential health issues. These visits are designed to focus on prevention, allowing your provider to concentrate on managing your acute and chronic conditions during your regular appointments.  Please note that Annual Wellness Visits do not include a physical exam. Some assessments may be limited, especially if the visit was conducted virtually. If needed, we may recommend a separate in-person follow-up with your provider.  Ongoing Care Seeing your primary care provider every 3 to 6 months helps us  monitor your health and provide consistent, personalized care.   Referrals If a referral was made during today's visit and you haven't received any updates within two weeks, please contact the referred provider directly to check on the status.  Recommended Screenings:  Health Maintenance  Topic Date Due   Colon Cancer Screening  12/23/2022   COVID-19 Vaccine (5 - 2025-26 season) 09/04/2023   Medicare Annual Wellness Visit  09/19/2023   DTaP/Tdap/Td vaccine (4 - Td or Tdap) 11/11/2027   Pneumococcal Vaccine for age over 48  Completed   Flu Shot  Completed   Hepatitis C Screening  Completed   Zoster (Shingles) Vaccine  Completed   HPV Vaccine  Aged Out   Meningitis B Vaccine  Aged Out       09/25/2023    3:03 PM  Advanced Directives  Does Patient Have a Medical Advance Directive? Yes  Type of Estate agent of [place];Living will  Copy of Healthcare Power of Attorney in Chart? No - copy requested   Advance Care Planning is important because it: Ensures you receive medical care that aligns with your values, goals, and  preferences. Provides guidance to your family and loved ones, reducing the emotional burden of decision-making during critical moments.  Vision: Annual vision screenings are recommended for early detection of glaucoma, cataracts, and diabetic retinopathy. These exams can also reveal signs of chronic conditions such as diabetes and high blood pressure.  Dental: Annual dental screenings help detect early signs of oral cancer, gum disease, and other conditions linked to overall health, including heart disease and diabetes.  Please see the attached documents for additional preventive care recommendations.

## 2023-09-27 ENCOUNTER — Ambulatory Visit: Admitting: Behavioral Health

## 2023-09-28 ENCOUNTER — Ambulatory Visit: Admitting: Psychology

## 2023-09-28 DIAGNOSIS — F4321 Adjustment disorder with depressed mood: Secondary | ICD-10-CM

## 2023-09-28 NOTE — Progress Notes (Addendum)
 [name] Counselor/Therapist Progress Note  Patient ID: Dan [name], MRN: [id number],    Date: 09/28/2023  Time Spent:  58 mins    start time: 1100    end time: 1158  Treatment Type: Individual Therapy  Reported Symptoms: Pt presents in person in the office, granting consent for the session.  Mental Status Exam: Appearance:  Casual     Behavior: Appropriate  Motor: Normal  Speech/Language:  Clear and Coherent  Affect: Appropriate  Mood: normal  Thought process: normal  Thought content:   WNL  Sensory/Perceptual disturbances:   WNL  Orientation: oriented to person, place, and time/date  Attention: Good  Concentration: Good  Memory: WNL  Fund of knowledge:  Good  Insight:   Good  Judgment:  Good  Impulse Control: Good   Risk Assessment: Danger to Self:  No Self-injurious Behavior: No Danger to Others: No Duty to Warn:no Physical Aggression / Violence:No  Access to Firearms a concern: No  Gang Involvement:No   Subjective: Pt shares that I have a new neurologist at [name], in the movement disorder clinic and he is great; I also past all the tests for the deep brain stimulation.  I am moving to [facility] on 10/16.  My apt there is being painted and I am really looking forward to being there.  My sister and my friend Dan [name] (from [place]) both came in for my birthday.  I had a lovely time with them.  Dan [name] and Dan [name] both make me happy and Dan [name] does not make me happy.   The rent at [name] will be $2000.00 per month and that includes his food and the amenities.  Pt shares that he and Dan [name] had a big argument about Dan [name] and Dan [name] coming at the same time; pt is still hurt by this interaction.  Pt shares that Dan [name]'s parents have been very kind and helpful to him during this period of time when he is downsizing.  Pt continues to struggle with his Parkinson's symptoms.  Pt is no longer able to use the pump and that medication.  Pt shares that he is taking  Ativan  from [name] and it is helping him.  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  Pt is going to the gym once per week and he enjoys that.  He has upcoming appts with his urologist and with the [name] in the near future.  Pt shares that he is a candidate for the deep brain stimulator but he is not scheduled for the procedure yet.  Pt has has an annual physical recently and that went well.  Pt shares that he is feeling grateful for what is going on in his life right now.  Pt shares that Dan [name] continues to help pt and pt is continuing to trim his time down in an effort to save money.  Encouraged pt to continue with his self care activities and we will meet in 2 weeks for a follow up session.  Interventions: Cognitive Behavioral Therapy  Diagnosis:Adjustment disorder with depressed mood  Plan: Treatment Plan Strengths/Abilities:  Intelligent, Intuitive, Willing to participate in therapy Treatment Preferences:  Outpatient Individual Therapy Statement of Needs:  Patient is to use CBT, mindfulness and coping skills to help manage and/or decrease symptoms associated with their diagnosis. Symptoms:  Depressed/Irritable mood, worry, social withdrawal Problems Addressed:  Depressive thoughts, Sadness, Sleep issues, etc. Long Term Goals:  Pt to reduce overall level, frequency, and intensity  of the feelings of depression as evidenced by decreased irritability, negative self talk, and helpless feelings from 6 to 7 days/week to 0 to 1 days/week, per client report, for at least 3 consecutive months.  Progress: 30% Short Term Goals:  Pt to verbally express understanding of the relationship between feelings of depression and their impact on thinking patterns and behaviors.  Pt to verbalize an understanding of the role that distorted thinking plays in creating fears, excessive worry, and ruminations.  Progress: 30% Target Date:  07/25/2024 Frequency:   Bi-weekly Modality:  Cognitive Behavioral Therapy Interventions by Therapist:  Therapist will use CBT, Mindfulness exercises, Coping skills and Referrals, as needed by client. Client has verbally approved this treatment plan.  Francis KATHEE Macintosh, [facility]

## 2023-10-12 ENCOUNTER — Ambulatory Visit: Admitting: Psychology

## 2023-10-12 DIAGNOSIS — F4321 Adjustment disorder with depressed mood: Secondary | ICD-10-CM

## 2023-10-12 NOTE — Progress Notes (Addendum)
 [facility] Counselor/Therapist Progress Note  Patient ID: Dan [name], MRN: [id number],    Date: 10/12/2023  Time Spent:  50 mins    start time: 1100    end time: 1150  Treatment Type: Individual Therapy  Reported Symptoms: Pt presents in person in the office, granting consent for the session.  Mental Status Exam: Appearance:  Casual     Behavior: Appropriate  Motor: Normal  Speech/Language:  Clear and Coherent  Affect: Appropriate  Mood: normal  Thought process: normal  Thought content:   WNL  Sensory/Perceptual disturbances:   WNL  Orientation: oriented to person, place, and time/date  Attention: Good  Concentration: Good  Memory: WNL  Fund of knowledge:  Good  Insight:   Good  Judgment:  Good  Impulse Control: Good   Risk Assessment: Danger to Self:  No Self-injurious Behavior: No Danger to Others: No Duty to Warn:no Physical Aggression / Violence:No  Access to Firearms a concern: No  Gang Involvement:No   Subjective: Pt shares that I went to orientation at [facility] yesterday and I learned some things about the facility that I did not know.  My move in date is a week from today (10/16).  I know it is the right decision and I still have some feelings about it.  Talked with pt about the change he is going through and that ambivalent feelings are normal in this time for him.  Pt shares he really appreciates the help he has been getting from [name]'s parents.  Pt shares that [name] is being supportive of pt but there are still some times that [name] wants to control things for pt.  Pt shares that his realtor has come by again and thinks he will do fine on the sale of his home.  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  I go to [name] again on 11/4 to see a different neurologist related to the deep brain stimulation program; pt shares he has some concern related to the  procedure; they drill two holes in your head.  His friend [name] has been taking him to all of his appts at [facility] and he appreciates her support.  Pt shares he is happy to have all of the support from friends and family in this time and process for himself.  Pt shares that he is taking Ativan  from [facility] and it is helping him.  Pt is going to the gym once per week and he enjoys that.  Pt is continuing to benefit from the help he gets from [facility].  Encouraged pt to continue with his self care activities and we will meet in 2 weeks for a follow up session.  Interventions: Cognitive Behavioral Therapy  Diagnosis:Adjustment disorder with depressed mood  Plan: Treatment Plan Strengths/Abilities:  Intelligent, Intuitive, Willing to participate in therapy Treatment Preferences:  Outpatient Individual Therapy Statement of Needs:  Patient is to use CBT, mindfulness and coping skills to help manage and/or decrease symptoms associated with their diagnosis. Symptoms:  Depressed/Irritable mood, worry, social withdrawal Problems Addressed:  Depressive thoughts, Sadness, Sleep issues, etc. Long Term Goals:  Pt to reduce overall level, frequency, and intensity of the feelings of depression as evidenced by decreased irritability, negative self talk, and helpless feelings from 6 to 7 days/week to 0 to 1 days/week, per client report, for at least 3 consecutive months.  Progress: 30% Short  Term Goals:  Pt to verbally express understanding of the relationship between feelings of depression and their impact on thinking patterns and behaviors.  Pt to verbalize an understanding of the role that distorted thinking plays in creating fears, excessive worry, and ruminations.  Progress: 30% Target Date:  07/25/2024 Frequency:  Bi-weekly Modality:  Cognitive Behavioral Therapy Interventions by Therapist:  Therapist will use CBT, Mindfulness exercises, Coping skills and Referrals, as needed by client. Client has verbally  approved this treatment plan.  Francis KATHEE Macintosh, [facility]

## 2023-10-30 ENCOUNTER — Telehealth: Payer: Self-pay | Admitting: Behavioral Health

## 2023-10-30 ENCOUNTER — Other Ambulatory Visit: Payer: Self-pay

## 2023-10-30 DIAGNOSIS — F410 Panic disorder [episodic paroxysmal anxiety] without agoraphobia: Secondary | ICD-10-CM

## 2023-10-30 DIAGNOSIS — F411 Generalized anxiety disorder: Secondary | ICD-10-CM

## 2023-10-30 MED ORDER — LORAZEPAM 0.5 MG PO TABS
0.5000 mg | ORAL_TABLET | Freq: Two times a day (BID) | ORAL | 0 refills | Status: DC | PRN
Start: 2023-10-30 — End: 2023-11-20

## 2023-10-30 NOTE — Telephone Encounter (Signed)
 Pended

## 2023-10-30 NOTE — Telephone Encounter (Signed)
 Pt called at 1:38p requesting refill of Lorazepam  to   [facility] [id number] [place], [place] - [address] [place] [address] Phone: [phone]  Fax: [phone]   Next appt 11/17

## 2023-10-31 ENCOUNTER — Ambulatory Visit: Admitting: Psychology

## 2023-10-31 DIAGNOSIS — F4321 Adjustment disorder with depressed mood: Secondary | ICD-10-CM

## 2023-10-31 NOTE — Progress Notes (Signed)
 [facility] Counselor/Therapist Progress Note  Patient ID: Dan [name], MRN: [id number],    Date: 10/31/2023  Time Spent:  55 mins    start time: 1000    end time: 1055  Treatment Type: Individual Therapy  Reported Symptoms: Pt presents in person in the office, granting consent for the session.  Mental Status Exam: Appearance:  Casual     Behavior: Appropriate  Motor: Normal  Speech/Language:  Clear and Coherent  Affect: Appropriate  Mood: normal  Thought process: normal  Thought content:   WNL  Sensory/Perceptual disturbances:   WNL  Orientation: oriented to person, place, and time/date  Attention: Good  Concentration: Good  Memory: WNL  Fund of knowledge:  Good  Insight:   Good  Judgment:  Good  Impulse Control: Good   Risk Assessment: Danger to Self:  No Self-injurious Behavior: No Danger to Others: No Duty to Warn:no Physical Aggression / Violence:No  Access to Firearms a concern: No  Gang Involvement:No   Subjective: Pt shares that My move to [facility] went well (10/16).  Dan [name] has calmed down a lot since I have moved and my home is now on the market and there are frequent showings.  I will also tell you that Dan [name]'s mom got upset with me for putting my house on the market before she thought it should have been.  She uninvited me to lunch this past Sunday.  I wrote a nice thank you card and dropped it off at the restaurant on Sunday for the hostess to give to her.  It seems that everything is fine now.  Pt is not sure why Dan [name]'s mom acted this way but he is glad that it seems to have been resolved now.  Pt was sad to leave his home but is sure that he did it at the right time.  Pt is going to [name] again on 11/4 to talk with another physician about deep brain stimulation; I am having some ambivalence about making the decision one way or the other.  Pt shares he decided to go to church this past Sunday and he wanted to dress up; I wore a shirt and a  tie and I got lots of compliments at church.  Pt shares that Dan [name] has shared with pt that he should have gotten an apt with a kitchen; pt explained to her that those were more expensive and pt did not feel that would have been a good idea for pt.  Pt shares that Dan [name] is coming to see pt and their mom on 11/6 and will be staying in the guest apt at [facility].  Pt shares that he feels a greater sense of peace in his life now being at [facility].  Pt shares that he is taking Ativan  from [facility] and it is helping him.  Pt is going to the gym once per week and he enjoys that.  Pt is continuing to benefit from the help he gets from [place].  Encouraged pt to continue with his self care activities and we will meet in 2 weeks for a follow up session.  Interventions: Cognitive Behavioral Therapy  Diagnosis:Adjustment disorder with depressed mood  Plan: Treatment Plan Strengths/Abilities:  Intelligent, Intuitive, Willing to participate in therapy Treatment Preferences:  Outpatient Individual Therapy Statement of Needs:  Patient is to use CBT, mindfulness and coping skills to help manage and/or decrease symptoms associated with their diagnosis. Symptoms:  Depressed/Irritable mood, worry, social withdrawal Problems Addressed:  Depressive  thoughts, Sadness, Sleep issues, etc. Long Term Goals:  Pt to reduce overall level, frequency, and intensity of the feelings of depression as evidenced by decreased irritability, negative self talk, and helpless feelings from 6 to 7 days/week to 0 to 1 days/week, per client report, for at least 3 consecutive months.  Progress: 30% Short Term Goals:  Pt to verbally express understanding of the relationship between feelings of depression and their impact on thinking patterns and behaviors.  Pt to verbalize an understanding of the role that distorted thinking plays in creating fears, excessive worry, and ruminations.  Progress: 30% Target Date:  07/25/2024 Frequency:   Bi-weekly Modality:  Cognitive Behavioral Therapy Interventions by Therapist:  Therapist will use CBT, Mindfulness exercises, Coping skills and Referrals, as needed by client. Client has verbally approved this treatment plan.  Francis KATHEE Macintosh, [facility]

## 2023-11-02 ENCOUNTER — Ambulatory Visit: Payer: Self-pay

## 2023-11-02 ENCOUNTER — Ambulatory Visit: Payer: Self-pay | Attending: Neurology | Admitting: Physical Therapy

## 2023-11-02 ENCOUNTER — Ambulatory Visit: Payer: Self-pay | Admitting: Occupational Therapy

## 2023-11-02 DIAGNOSIS — R471 Dysarthria and anarthria: Secondary | ICD-10-CM | POA: Insufficient documentation

## 2023-11-02 DIAGNOSIS — R29818 Other symptoms and signs involving the nervous system: Secondary | ICD-10-CM

## 2023-11-02 DIAGNOSIS — R2689 Other abnormalities of gait and mobility: Secondary | ICD-10-CM | POA: Insufficient documentation

## 2023-11-02 NOTE — Therapy (Signed)
 [place] [facility] [address] [place], [place], [address] Phone: [phone]   Fax:  [phone]  Patient Details  Name: Dan [name] MRN: [id number] Date of Birth: [birth year] Referring Provider:  Evonnie Asberry RAMAN, DO  Encounter Date: 11/02/2023  Occupational Therapy Parkinson's Disease Screen  Hand dominance:  right  Physical Performance Test item #1 (handwriting): 11.87 sec for [place]. Good sizing, spacing, and legibility.  Pt does report that he was once known for his penmanship and notes handwriting is changing for him.   Fastening/unfastening 3 buttons in:  24.91sec  9-hole peg test:    RUE  25.53 sec        LUE  41.06 sec  Other Comments:  Pt reports that he recently moved into [facility].  Pt reports that his handwriting has really gotten bad.  Pt reports scissors can be challenging.  Pt does not require occupational therapy services at this time.  Recommended occupational therapy screen in  6-8 months   [place], [place], OT 11/02/2023, 12:02 PM  [place] [facility] [address] [place], [place], [address] Phone: [phone]   Fax:  [phone]

## 2023-11-02 NOTE — Therapy (Signed)
 [place] [facility] [address] [place], [place], [address] Phone: [phone]   Fax:  [phone]  Patient Details  Name: Dan [name] MRN: [id number] Date of Birth: 01/16/1955 Referring Provider:  [name], MD  Encounter Date: 11/02/2023   Physical Therapy Parkinson's Disease Screen   Timed Up and Go test:8.38 sec (compared to 8.79 sec)  10 meter walk test: 6.59 sec = 4.98 ft/sec (compared to 4.45 ft/sec)  5 time sit to stand test:12.34 sec (compared to 13.06 sec)  Patient does not require Physical Therapy services at this time.  Recommend Physical Therapy screen in 6-9 months. No falls, going back to [facility], and just moved to [facility].     Ruqayya Ventress W., PT 11/02/2023, 11:45 AM  [facility] [address] [place], [place], [address] Phone: [phone]   Fax:  [phone]

## 2023-11-02 NOTE — Therapy (Signed)
 [place] [facility] [address] [place], [place], [address] Phone: [phone]   Fax:  [phone]  Patient Details  Name: Dan [name] MRN: [id number] Date of Birth: 1955-03-20 Referring Provider:  Evonnie Asberry RAMAN, DO  Encounter Date: 11/02/2023  Speech Therapy Parkinson's Disease Screen   Decibel Level today: 71dB  (WNL=70-72 dB) with sound level meter 30cm away from pt's mouth. Pt's conversational volume is WNL  Pt does not report difficulty with swallowing, which does not warrant further evaluation  Pt does not endorse changes in cognition.  Pt does does not require speech therapy services at this time. Recommend ST screen in another 6-8 months  Tyla Burgner, CCC-SLP 11/02/2023, 12:40 PM  [place] [facility] [address] [place], [place], [address] Phone: [phone]   Fax:  [phone]

## 2023-11-07 DIAGNOSIS — G20B2 Parkinson's disease with dyskinesia, with fluctuations: Secondary | ICD-10-CM | POA: Diagnosis not present

## 2023-11-14 ENCOUNTER — Ambulatory Visit: Admitting: Psychology

## 2023-11-14 DIAGNOSIS — F4321 Adjustment disorder with depressed mood: Secondary | ICD-10-CM | POA: Diagnosis not present

## 2023-11-14 NOTE — Progress Notes (Signed)
 [facility] Counselor/Therapist Progress Note  Patient ID: MADOX CORKINS, MRN: [id number],    Date: 10/31/2023  Time Spent:  52 mins    start time: 1008    end time: 1100  Treatment Type: Individual Therapy  Reported Symptoms: Pt presents in person in the office, granting consent for the session.  Mental Status Exam: Appearance:  Casual     Behavior: Appropriate  Motor: Normal  Speech/Language:  Clear and Coherent  Affect: Appropriate  Mood: normal  Thought process: normal  Thought content:   WNL  Sensory/Perceptual disturbances:   WNL  Orientation: oriented to person, place, and time/date  Attention: Good  Concentration: Good  Memory: WNL  Fund of knowledge:  Good  Insight:   Good  Judgment:  Good  Impulse Control: Good   Risk Assessment: Danger to Self:  No Self-injurious Behavior: No Danger to Others: No Duty to Warn:no Physical Aggression / Violence:No  Access to Firearms a concern: No  Gang Involvement:No   Subjective: Pt shares that jI have now been at [facility] for 3 weeks and it is going well; I am not a fan of the food but it is OK.  I have sold my home for cash to a physician and his wife who have have a home in [place] and another one in [place].  The inspection is today.  My sister, [name], was here for several days and just left yesterday; it was so good to have her here.  Pt shares that he has been to his attorney to review his will.  He wants to have all of his affairs in order before he has his deep brain stimulator done; he may have it done in late January.  He is checking with [employer] to see what his maximum costs will be for the procedure.  Pt shares that his home sold in less than two weeks and he got a good price for it.  Pt shares that [name] is doing well.  His parents are doing well; his mother is back to being good with me from the issue we had right before I put my house on the market.  Pt shares his appt at [facility] on 11/4  went well and he was able to get his questions answered.  He learned that the first procedure is an overnight stay and then a week or two later he goes back to have the wires implanted.  Pt shares that he already has Thanksgiving plans with [name], his parents, and his friend [name] and her son.  Pt shares that he is taking Ativan  from [facility] and it is helping him.  Pt is going to the gym once per week and he enjoys that.  Pt is continuing to benefit from the help he gets from [place].  Encouraged pt to continue with his self care activities and we will meet in 2 weeks for a follow up session.  Interventions: Cognitive Behavioral Therapy  Diagnosis:Adjustment disorder with depressed mood  Plan: Treatment Plan Strengths/Abilities:  Intelligent, Intuitive, Willing to participate in therapy Treatment Preferences:  Outpatient Individual Therapy Statement of Needs:  Patient is to use CBT, mindfulness and coping skills to help manage and/or decrease symptoms associated with their diagnosis. Symptoms:  Depressed/Irritable mood, worry, social withdrawal Problems Addressed:  Depressive thoughts, Sadness, Sleep issues, etc. Long Term Goals:  Pt to reduce overall level, frequency, and intensity of the feelings of depression as evidenced by decreased irritability, negative self talk, and helpless feelings  from 6 to 7 days/week to 0 to 1 days/week, per client report, for at least 3 consecutive months.  Progress: 30% Short Term Goals:  Pt to verbally express understanding of the relationship between feelings of depression and their impact on thinking patterns and behaviors.  Pt to verbalize an understanding of the role that distorted thinking plays in creating fears, excessive worry, and ruminations.  Progress: 30% Target Date:  07/25/2024 Frequency:  Bi-weekly Modality:  Cognitive Behavioral Therapy Interventions by Therapist:  Therapist will use CBT, Mindfulness exercises, Coping skills and Referrals, as needed  by client. Client has verbally approved this treatment plan.  Francis KATHEE Macintosh, [facility]

## 2023-11-20 ENCOUNTER — Encounter: Payer: Self-pay | Admitting: Behavioral Health

## 2023-11-20 ENCOUNTER — Ambulatory Visit: Admitting: Behavioral Health

## 2023-11-20 DIAGNOSIS — F410 Panic disorder [episodic paroxysmal anxiety] without agoraphobia: Secondary | ICD-10-CM | POA: Diagnosis not present

## 2023-11-20 DIAGNOSIS — F411 Generalized anxiety disorder: Secondary | ICD-10-CM

## 2023-11-20 DIAGNOSIS — M79675 Pain in left toe(s): Secondary | ICD-10-CM | POA: Diagnosis not present

## 2023-11-20 MED ORDER — LORAZEPAM 0.5 MG PO TABS: 0.5000 mg | ORAL_TABLET | Freq: Two times a day (BID) | ORAL | 3 refills | Status: DC | PRN

## 2023-11-20 NOTE — Progress Notes (Signed)
 [facility]  Patient ID: Dan [name],  MRN: [id number]  PCP: [name], MD  Date of Evaluation: 11/20/2023 Time spent:30 minutes  Chief Complaint:  Chief Complaint   Anxiety; Follow-up; Medication Refill; Patient Education; Stress     HISTORY/CURRENT STATUS: HPI Dan [name], 68 year old male presents to this office for follow up and medication management.  Very positive in good spirits today. Ambulating with cane today, broke his fifth toe on left foot.   He numerically rates depression at 1/10, and anxiety at 5/10.  Says that he sleeps 7 to 8 hours daily.  He denies any history of mania, no psychosis, no auditory or visual hallucinations or delirium.  Acknowledges strong family and friend support group.  Patient is currently retired.  No problems with attention and focus.  Appetite is good.   Prior psychiatric medication trials: Sertraline -only took medication for very short period, not sure of the dose     Individual Medical History/ Review of Systems: Changes? :No   Allergies: Amoxicillin and Sertraline   Current Medications:  Current Outpatient Medications:    carbidopa -levodopa  (SINEMET  IR) 25-100 MG tablet, Take 2 pills at 6am/8:30/10:30/1pm/4pm May take 1 PRN daily if needed, Disp: 915 tablet, Rfl: 0   LORazepam  (ATIVAN ) 0.5 MG tablet, Take 1 tablet (0.5 mg total) by mouth 2 (two) times daily as needed for anxiety., Disp: 60 tablet, Rfl: 3   RYTARY  48.75-195 MG CPCR, Take 2 tablets by mouth. Six times per day, Disp: , Rfl:  Medication Side Effects: none  Family Medical/ Social History: Changes? No  MENTAL HEALTH EXAM:  There were no vitals taken for this visit.There is no height or weight on file to calculate BMI.  General Appearance: Casual, Neat, and Well Groomed  Eye Contact:  Good  Speech:  Clear and Coherent  Volume:  Normal  Mood:  Anxious and Depressed  Affect:  Depressed and Anxious  Thought Process:  Coherent  Orientation:  Full  (Time, Place, and Person)  Thought Content: Logical   Suicidal Thoughts:  No  Homicidal Thoughts:  No  Memory:  WNL  Judgement:  Good  Insight:  Good  Psychomotor Activity:  Normal  Concentration:  Concentration: Good  Recall:  Good  Fund of Knowledge: Good  Language: Good  Assets:  Desire for Improvement  ADL's:  Intact  Cognition: WNL  Prognosis:  Good    DIAGNOSES:    ICD-10-CM   1. Generalized anxiety disorder  F41.1 LORazepam  (ATIVAN ) 0.5 MG tablet    2. Panic attack  F41.0 LORazepam  (ATIVAN ) 0.5 MG tablet      Receiving Psychotherapy: No    RECOMMENDATIONS:   Greater than 50% of 30   min face to face time with patient was spent on counseling and coordination of care. We reviewed his problems with increased anxiety and panic secondary to Parkinson's disease.  He is very positive today and recently sold his home and now is moving in independent living at [facility].  Very positive and upbeat today.  Ativan  continues to help with anxiety as well as calm some of his abnormal movements associated with Parkinson's.  His movements become more pronounced when he is experiencing exacerbated anxiety.    We agreed today to:   To continue to Ativan  0.5 mg twice daily as needed for severe anxiety Will follow-up in 12 weeks to reassess Provided emergency contact information To report worsening symptoms or side effects promptly Discussed potential benefits, risk, and side effects of benzodiazepines to include potential  risk of tolerance and dependence, as well as possible drowsiness.  Advised patient not to drive if experiencing drowsiness and to take lowest possible effective dose to minimize risk of dependence and tolerance.  Reviewed PDMP    [name], NP

## 2023-11-21 DIAGNOSIS — Z8042 Family history of malignant neoplasm of prostate: Secondary | ICD-10-CM | POA: Diagnosis not present

## 2023-11-21 DIAGNOSIS — R972 Elevated prostate specific antigen [PSA]: Secondary | ICD-10-CM | POA: Diagnosis not present

## 2023-11-21 DIAGNOSIS — C61 Malignant neoplasm of prostate: Secondary | ICD-10-CM | POA: Diagnosis not present

## 2023-12-05 ENCOUNTER — Ambulatory Visit: Admitting: Psychology

## 2023-12-05 DIAGNOSIS — F4321 Adjustment disorder with depressed mood: Secondary | ICD-10-CM | POA: Diagnosis not present

## 2023-12-05 NOTE — Progress Notes (Signed)
 [facility] Counselor/Therapist Progress Note  Patient ID: Dan [name], MRN: [id number],    Date: 10/31/2023  Time Spent:  54 mins    start time: 1006    end time: 1100  Treatment Type: Individual Therapy  Reported Symptoms: Pt presents in person in the office, granting consent for the session.  Mental Status Exam: Appearance:  Casual     Behavior: Appropriate  Motor: Normal  Speech/Language:  Clear and Coherent  Affect: Appropriate  Mood: normal  Thought process: normal  Thought content:   WNL  Sensory/Perceptual disturbances:   WNL  Orientation: oriented to person, place, and time/date  Attention: Good  Concentration: Good  Memory: WNL  Fund of knowledge:  Good  Insight:   Good  Judgment:  Good  Impulse Control: Good   Risk Assessment: Danger to Self:  No Self-injurious Behavior: No Danger to Others: No Duty to Warn:no Physical Aggression / Violence:No  Access to Firearms a concern: No  Gang Involvement:No   Notified  Subjective: Pt shares that I am still enjoying being at [facility].  We closed on the sale of my home and I am glad to have completed that.  I met the new owners yesterday and we had a very nice visit.  They are glad to have the home now.  Pt reiterates that he is glad that things worked they way they did and he feels safe at [facility].  Pt shares that [name] has settled down now they pt is settled in his new apt.  [name] is still coming to help pt each morning and he appreciates that.  [name] has been interviewed by another couple at [facility] and he will be working for them as well.  Pt shares that he is going to have to shave an hour off of the time that [place]man spends with him; he has some concerns about having Manny come at 6am instead of at 5am.  Pt is still considering the deep brain stimulator treatment and hopes to have it done in Feb.  Pt shares that his mom's dementia continues to progress; she has no short term memory now;  she can tell you anything that happened in her past but she cannot recall what she had for lunch.  He continues to take his mom to [employer] for ice cream and she enjoys that.  Pt shares that [name] is doing pretty well; he still likes to control as much as he can for pt.  Pt shares that he got reimbursed for the medication pump that he he paid for in the past (almost $1900.00) and he was thankful for that.  Pt continues to have my glass half full.  Pt shares they all went to [facility] for Thanksgiving dinner and they had a nice meal.  They are going to go to [employer] for Christmas Eve dinner; big deal for Italians and pt will help to prepare the meal.  Pt shares that he is taking Ativan  from [facility] and it is helping him.  Pt is going to the gym once per week and he enjoys that activity.  Encouraged pt to continue with his self care activities and we will meet in 2 weeks for a follow up session.  Interventions: Cognitive Behavioral Therapy  Diagnosis:Adjustment disorder with depressed mood  Plan: Treatment Plan Strengths/Abilities:  Intelligent, Intuitive, Willing to participate in therapy Treatment Preferences:  Outpatient Individual Therapy Statement of Needs:  Patient is to use CBT, mindfulness and coping  skills to help manage and/or decrease symptoms associated with their diagnosis. Symptoms:  Depressed/Irritable mood, worry, social withdrawal Problems Addressed:  Depressive thoughts, Sadness, Sleep issues, etc. Long Term Goals:  Pt to reduce overall level, frequency, and intensity of the feelings of depression as evidenced by decreased irritability, negative self talk, and helpless feelings from 6 to 7 days/week to 0 to 1 days/week, per client report, for at least 3 consecutive months.  Progress: 30% Short Term Goals:  Pt to verbally express understanding of the relationship between feelings of depression and their impact on thinking patterns and behaviors.  Pt to verbalize an  understanding of the role that distorted thinking plays in creating fears, excessive worry, and ruminations.  Progress: 30% Target Date:  07/25/2024 Frequency:  Bi-weekly Modality:  Cognitive Behavioral Therapy Interventions by Therapist:  Therapist will use CBT, Mindfulness exercises, Coping skills and Referrals, as needed by client. Client has verbally approved this treatment plan.  Francis KATHEE Macintosh, [facility]

## 2023-12-19 ENCOUNTER — Ambulatory Visit: Admitting: Psychology

## 2023-12-19 DIAGNOSIS — F4321 Adjustment disorder with depressed mood: Secondary | ICD-10-CM | POA: Diagnosis not present

## 2023-12-19 NOTE — Progress Notes (Signed)
 [facility] Counselor/Therapist Progress Note  Patient ID: Dan [name], MRN: [id number],    Date: 10/31/2023  Time Spent:  56 mins    start time: 1004    end time: 1100  Treatment Type: Individual Therapy  Reported Symptoms: Pt presents in person in the office, granting consent for the session.  Mental Status Exam: Appearance:  Casual     Behavior: Appropriate  Motor: Normal  Speech/Language:  Clear and Coherent  Affect: Appropriate  Mood: normal  Thought process: normal  Thought content:   WNL  Sensory/Perceptual disturbances:   WNL  Orientation: oriented to person, place, and time/date  Attention: Good  Concentration: Good  Memory: WNL  Fund of knowledge:  Good  Insight:   Good  Judgment:  Good  Impulse Control: Good   Risk Assessment: Danger to Self:  No Self-injurious Behavior: No Danger to Others: No Duty to Warn:no Physical Aggression / Violence:No  Access to Firearms a concern: No  Gang Involvement:No   Notified of retirement  Subjective: Pt shares that I have hard from the neurosurgeon at [facility] and he is giving me three different dates in early March for me to choose from and that is what I am going to do.  I am a little uncertain about it but I am also looking forward to being better. Pt is hopeful that the deep brain stimulation procedure will allow him to travel again at least once.  Dan [name] is still helping pt and he appreciates that help.  Pt is meeting with his financial advisor tomorrow and he is going to establish a budget for himself.  Pt is planning to gather with different friend groups and has been doing that since Thanksgiving.  Pt has been cooking at [employer] home and they enjoy having meals together and pt feels good about being able to do that.  Pt shares that Dan [name]'s mom has again gotten upset again; they are planning to go to Dan [name]'s for a late lunch on Christmas Eve and he is going to have Dan [name], pt's mom, and Dan [name] for Christmas Day  lunch at [facility]; Dan [name]'s mom does not feel like she is getting enough of Dan [name]'s time over Christmas.  Pt told Dan [name] that it was his responsibility to please his own mom and to do whatever is necessary there.  Pt shares that, historically, he has Italian food for Christmas eve and Christmas day and Dan [name]'s parents normally eat their own favorites which are different.  Pt shares his mom is doing OK; she did get upset recently when she asked pt how much a stamp cost and he told her 20 cents; she tends to get more upset now than she used to in the past.  His mom has a photo in her room of her wedding day and she knows every one in the photo and can tells stories about all of those people.  His mom cannot remember any short term issues now and that is frustrating for him.  Pt continues to have my glass half full.  Pt continues to go to church regularly and he enjoys that time.  Pt shares that he is taking Ativan  from [facility] and it is helping him.  Pt is going to the gym once per week and he enjoys that activity.  Encouraged pt to continue with his self care activities and we will meet in 2 weeks for a follow up session.  Interventions: Cognitive Behavioral Therapy  Diagnosis:Adjustment disorder with depressed mood  Plan: Treatment Plan Strengths/Abilities:  Intelligent, Intuitive, Willing to participate in therapy Treatment Preferences:  Outpatient Individual Therapy Statement of Needs:  Patient is to use CBT, mindfulness and coping skills to help manage and/or decrease symptoms associated with their diagnosis. Symptoms:  Depressed/Irritable mood, worry, social withdrawal Problems Addressed:  Depressive thoughts, Sadness, Sleep issues, etc. Long Term Goals:  Pt to reduce overall level, frequency, and intensity of the feelings of depression as evidenced by decreased irritability, negative self talk, and helpless feelings from 6 to 7 days/week to 0 to 1 days/week, per client report, for at least 3  consecutive months.  Progress: 30% Short Term Goals:  Pt to verbally express understanding of the relationship between feelings of depression and their impact on thinking patterns and behaviors.  Pt to verbalize an understanding of the role that distorted thinking plays in creating fears, excessive worry, and ruminations.  Progress: 30% Target Date:  07/25/2024 Frequency:  Bi-weekly Modality:  Cognitive Behavioral Therapy Interventions by Therapist:  Therapist will use CBT, Mindfulness exercises, Coping skills and Referrals, as needed by client. Client has verbally approved this treatment plan.  Francis KATHEE Macintosh, [facility]

## 2023-12-22 ENCOUNTER — Ambulatory Visit: Admitting: Family Medicine

## 2024-01-02 ENCOUNTER — Ambulatory Visit: Admitting: Psychology

## 2024-01-02 DIAGNOSIS — F4321 Adjustment disorder with depressed mood: Secondary | ICD-10-CM

## 2024-01-02 NOTE — Progress Notes (Signed)
 "  [name] Counselor/Therapist Progress Note  Patient ID: Dan [name], MRN: [id number],    Date: 01/02/2024  Time Spent:  56 mins    start time: 1004    end time: 1100  Treatment Type: Individual Therapy  Reported Symptoms: Pt presents in person in the office, granting consent for the session.  Mental Status Exam: Appearance:  Casual     Behavior: Appropriate  Motor: Normal  Speech/Language:  Clear and Coherent  Affect: Appropriate  Mood: normal  Thought process: normal  Thought content:   WNL  Sensory/Perceptual disturbances:   WNL  Orientation: oriented to person, place, and time/date  Attention: Good  Concentration: Good  Memory: WNL  Fund of knowledge:  Good  Insight:   Good  Judgment:  Good  Impulse Control: Good   Risk Assessment: Danger to Self:  No Self-injurious Behavior: No Danger to Others: No Duty to Warn:no Physical Aggression / Violence:No  Access to Firearms a concern: No  Gang Involvement:No   Notified of retirement  Subjective: Pt shares that We had a great Christmas holiday; we had a nice meal on my patio on Christmas day with [name], [name]'s parents, and my mom.  We had our Christmas Eve dinner at [employer] and it was lovely.  Pt shares that he believed that [name] got upset with pt for being late to pick up some things from her home on Monday before Christmas.  [name] yesterday shared with pt that she had a lot of things going on for her during the week before Christmas (car issues, HVAC issues, family issues, etc.).  Pt shares that [name] had a couple of issues over the holidays but, overall, it was fine.  Pt shares his mom did well during the holidays and pt was happy about that.  He shares that he and [name] are walking in the building each morning and they go to the gym at [facility] and stretch each morning; I have more energy doing that.  Pt shares that his appt with the neurosurgeon on 03/12/24 to begin the deep brain stimulator  process; the first part is the surgery, he then goes back two weeks later to have the wiring done.  He then goes back for several tune ups through August.  Pt is beginning to have second thoughts about having the procedure because what if something happens to me, who will care for my mom?  Pt met a woman at [facility] yesterday who is 3 yo and has the same birthday (month and day) as his mom but she is 10 yrs older than pt's mom; her mind is sharp as a tack and was very engaging with pt and he enjoyed talking with her.  Pt continues to see my glass as half full.  Pt continues to go to church regularly and he enjoys that time.  Pt shares that he is taking Ativan  from [facility] and it is helping him.  Encouraged pt to continue with his self care activities and we will meet in 2 weeks for a follow up session.  Interventions: Cognitive Behavioral Therapy  Diagnosis:Adjustment disorder with depressed mood  Plan: Treatment Plan Strengths/Abilities:  Intelligent, Intuitive, Willing to participate in therapy Treatment Preferences:  Outpatient Individual Therapy Statement of Needs:  Patient is to use CBT, mindfulness and coping skills to help manage and/or decrease symptoms associated with their diagnosis. Symptoms:  Depressed/Irritable mood, worry, social withdrawal Problems Addressed:  Depressive thoughts, Sadness, Sleep issues, etc. Long Term Goals:  Pt to reduce overall level, frequency, and intensity of the feelings of depression as evidenced by decreased irritability, negative self talk, and helpless feelings from 6 to 7 days/week to 0 to 1 days/week, per client report, for at least 3 consecutive months.  Progress: 30% Short Term Goals:  Pt to verbally express understanding of the relationship between feelings of depression and their impact on thinking patterns and behaviors.  Pt to verbalize an understanding of the role that distorted thinking plays in creating fears, excessive worry, and  ruminations.  Progress: 30% Target Date:  07/25/2024 Frequency:  Bi-weekly Modality:  Cognitive Behavioral Therapy Interventions by Therapist:  Therapist will use CBT, Mindfulness exercises, Coping skills and Referrals, as needed by client. Client has verbally approved this treatment plan.  Francis KATHEE Macintosh, [facility] "

## 2024-01-10 ENCOUNTER — Encounter

## 2024-01-16 ENCOUNTER — Ambulatory Visit: Admitting: Psychology

## 2024-01-25 ENCOUNTER — Encounter: Admitting: Gastroenterology

## 2024-01-30 ENCOUNTER — Ambulatory Visit: Admitting: Psychology

## 2024-01-31 ENCOUNTER — Telehealth: Payer: Self-pay | Admitting: Behavioral Health

## 2024-01-31 NOTE — Telephone Encounter (Signed)
 Just FYI. Pt was able to get an appt tomorrow. He doesn't feel like the Ativan  is working. He thinks his Parkinson's is overriding it. He is a candidate for DBS. He is also going to call his neurologist, feels he needs to increase the frequency of his Sinemet . He feels like he is SOB, like he has been on a treadmill. He reports lots of good things - he sold his house, is now living at [facility]. He said he has a new constellation of sx every day, that are most likely related to his Parkinson's. He reports not sleeping and has shaking at night, which amps up his anxiety.   He said you started him on Zoloft  and he was not able to tolerate it due to nausea. He said a friend suggested it might be helpful at a lower dose.

## 2024-01-31 NOTE — Telephone Encounter (Signed)
 Dan [name] called today inquiring about a medication they would help alleviate some issues he is experiencing. He is currently taking Lorazepam  for anxiety, but it is not helping  Patient requested an earlier appt to discuss these issues.He was not available for the opening on tomorrow so he was placed on the wait list.

## 2024-02-01 ENCOUNTER — Ambulatory Visit: Admitting: Behavioral Health

## 2024-02-01 ENCOUNTER — Encounter: Payer: Self-pay | Admitting: Behavioral Health

## 2024-02-01 DIAGNOSIS — F410 Panic disorder [episodic paroxysmal anxiety] without agoraphobia: Secondary | ICD-10-CM | POA: Diagnosis not present

## 2024-02-01 DIAGNOSIS — F411 Generalized anxiety disorder: Secondary | ICD-10-CM

## 2024-02-01 DIAGNOSIS — F4321 Adjustment disorder with depressed mood: Secondary | ICD-10-CM

## 2024-02-01 MED ORDER — LORAZEPAM 0.5 MG PO TABS: 0.5000 mg | ORAL_TABLET | Freq: Two times a day (BID) | ORAL | 3 refills | Status: AC | PRN

## 2024-02-01 MED ORDER — SERTRALINE HCL 50 MG PO TABS: 50.0000 mg | ORAL_TABLET | Freq: Every day | ORAL | 1 refills | Status: AC

## 2024-02-01 NOTE — Progress Notes (Signed)
 "     [facility] Check  Patient ID: Dan [name],  MRN: [id number]  PCP: Dan Garnette KIDD, MD  Date of Evaluation: 02/01/2024 Time spent:30 minutes  Chief Complaint:  Chief Complaint   Anxiety; Panic Attack; Follow-up; Patient Education; Stress     HISTORY/CURRENT STATUS: HPI  Dan [name], 69 year old male presents to this office for follow up and medication management.  Very positive in good spirits today.  Ambulating with a single-point cane today.  His movements related to Parkinson's is not as pronounced during this visit.  This was a working appointment because he does not want to rely solely on Ativan  to assist his anxiety or panic.  Says that his very close friend convinced him that he needs to retry Zoloft .  He was having some other medical issues at the time and does not believe that he gave Zoloft  a fair shot.  He numerically rates depression at 1/10, and anxiety at 5/10.  Says that he sleeps 7 to 8 hours daily.  He denies any history of mania, no psychosis, no auditory or visual hallucinations or delirium.  Acknowledges strong family and friend support group.  Patient is currently retired.  Living currently in [facility].  No problems with attention and focus.  Appetite is good.   Prior psychiatric medication trials: Sertraline -only took medication for very short period, not sure of the dose    Individual Medical History/ Review of Systems: Changes? :No   Allergies: Amoxicillin and Sertraline   Current Medications: Current Medications[1] Medication Side Effects: none  Family Medical/ Social History: Changes? No  MENTAL HEALTH EXAM:  There were no vitals taken for this visit.There is no height or weight on file to calculate BMI.  General Appearance: Casual, Neat, and Well Groomed  Eye Contact:  Good  Speech:  Clear and Coherent  Volume:  Normal  Mood:  NA  Affect:  Appropriate  Thought Process:  Coherent  Orientation:  Full (Time, Place,  and Person)  Thought Content: Logical   Suicidal Thoughts:  No  Homicidal Thoughts:  No  Memory:  WNL  Judgement:  Good  Insight:  Good  Psychomotor Activity:  Normal  Concentration:  Concentration: Good  Recall:  Good  Fund of Knowledge: Good  Language: Good  Assets:  Desire for Improvement  ADL's:  Intact  Cognition: WNL  Prognosis:  Good    DIAGNOSES:    ICD-10-CM   1. Adjustment disorder with depressed mood  F43.21 sertraline  (ZOLOFT ) 50 MG tablet    2. Generalized anxiety disorder  F41.1 sertraline  (ZOLOFT ) 50 MG tablet    LORazepam  (ATIVAN ) 0.5 MG tablet    3. Panic attack  F41.0 sertraline  (ZOLOFT ) 50 MG tablet    LORazepam  (ATIVAN ) 0.5 MG tablet      Receiving Psychotherapy: No    RECOMMENDATIONS:   Greater than 50% of 30   min face to face time with patient was spent on counseling and coordination of care.  He is ambulating much better today.  We reviewed his problems with increased anxiety and panic secondary to Parkinson's disease.  We discussed his desire to reinitiate Zoloft  and give it 1 more try.  He does not believe that his prior start gave the medication a fair shot.  We reviewed his medication and discussed alternatives.  He will start out with Zoloft  25 mg for 2 weeks and he is to make sure that he takes with food to help with nausea.  Agrees to contact this  office with any severe side effects.       We agreed today to:   To continue to Ativan  0.5 mg twice daily as needed for severe anxiety To restart Zoloft  25 mg daily in the a.m. with food for 2 weeks, then 50 mg daily. Will follow-up in 6 weeks to reassess Provided emergency contact information To report worsening symptoms or side effects promptly Discussed potential benefits, risk, and side effects of benzodiazepines to include potential risk of tolerance and dependence, as well as possible drowsiness.  Advised patient not to drive if experiencing drowsiness and to take lowest possible effective  dose to minimize risk of dependence and tolerance.  Reviewed PDMP    Dan DELENA Pizza, NP     [1]  Current Outpatient Medications:    sertraline  (ZOLOFT ) 50 MG tablet, Take 1 tablet (50 mg total) by mouth daily., Disp: 30 tablet, Rfl: 1   carbidopa -levodopa  (SINEMET  IR) 25-100 MG tablet, Take 2 pills at 6am/8:30/10:30/1pm/4pm May take 1 PRN daily if needed, Disp: 915 tablet, Rfl: 0   LORazepam  (ATIVAN ) 0.5 MG tablet, Take 1 tablet (0.5 mg total) by mouth 2 (two) times daily as needed for anxiety., Disp: 60 tablet, Rfl: 3   RYTARY  48.75-195 MG CPCR, Take 2 tablets by mouth. Six times per day, Disp: , Rfl:   "

## 2024-02-13 ENCOUNTER — Ambulatory Visit: Admitting: Psychology

## 2024-02-19 ENCOUNTER — Ambulatory Visit: Admitting: Behavioral Health

## 2024-04-01 ENCOUNTER — Ambulatory Visit: Admitting: Behavioral Health

## 2024-04-04 ENCOUNTER — Ambulatory Visit: Admitting: Behavioral Health

## 2024-05-16 ENCOUNTER — Ambulatory Visit

## 2024-05-16 ENCOUNTER — Ambulatory Visit: Admitting: Occupational Therapy

## 2024-05-16 ENCOUNTER — Ambulatory Visit: Admitting: Physical Therapy

## 2024-09-30 ENCOUNTER — Ambulatory Visit
